# Patient Record
Sex: Female | Born: 1945 | Race: Black or African American | Hispanic: No | Smoking: Current some day smoker
Health system: Southern US, Community
[De-identification: ages and names within clinical notes are randomized; demographics above are authoritative.]

## PROBLEM LIST (undated history)

## (undated) DIAGNOSIS — K759 Inflammatory liver disease, unspecified: Secondary | ICD-10-CM

## (undated) DIAGNOSIS — E119 Type 2 diabetes mellitus without complications: Secondary | ICD-10-CM

## (undated) DIAGNOSIS — K219 Gastro-esophageal reflux disease without esophagitis: Secondary | ICD-10-CM

## (undated) DIAGNOSIS — Z8669 Personal history of other diseases of the nervous system and sense organs: Secondary | ICD-10-CM

## (undated) DIAGNOSIS — Z8619 Personal history of other infectious and parasitic diseases: Secondary | ICD-10-CM

## (undated) DIAGNOSIS — K766 Portal hypertension: Secondary | ICD-10-CM

## (undated) DIAGNOSIS — K7460 Unspecified cirrhosis of liver: Secondary | ICD-10-CM

## (undated) DIAGNOSIS — E1169 Type 2 diabetes mellitus with other specified complication: Secondary | ICD-10-CM

## (undated) DIAGNOSIS — I8500 Esophageal varices without bleeding: Secondary | ICD-10-CM

## (undated) DIAGNOSIS — E785 Hyperlipidemia, unspecified: Secondary | ICD-10-CM

## (undated) DIAGNOSIS — K3189 Other diseases of stomach and duodenum: Secondary | ICD-10-CM

## (undated) DIAGNOSIS — I10 Essential (primary) hypertension: Secondary | ICD-10-CM

## (undated) HISTORY — DX: Type 2 diabetes mellitus without complications: E11.9

## (undated) HISTORY — DX: Esophageal varices without bleeding: I85.00

---

## 1992-02-20 HISTORY — PX: ABDOMINAL HYSTERECTOMY: SHX81

## 1993-02-19 HISTORY — PX: BREAST BIOPSY: SHX20

## 2004-02-20 DIAGNOSIS — E119 Type 2 diabetes mellitus without complications: Secondary | ICD-10-CM

## 2004-02-20 HISTORY — DX: Type 2 diabetes mellitus without complications: E11.9

## 2008-02-20 HISTORY — PX: COLONOSCOPY: SHX174

## 2008-10-05 ENCOUNTER — Ambulatory Visit: Payer: Self-pay | Admitting: Internal Medicine

## 2008-10-27 ENCOUNTER — Ambulatory Visit: Payer: Self-pay | Admitting: Internal Medicine

## 2009-01-21 ENCOUNTER — Ambulatory Visit: Payer: Self-pay | Admitting: Gastroenterology

## 2009-10-19 ENCOUNTER — Ambulatory Visit: Payer: Self-pay | Admitting: Internal Medicine

## 2010-08-15 ENCOUNTER — Ambulatory Visit: Payer: Self-pay | Admitting: Otolaryngology

## 2010-11-21 ENCOUNTER — Ambulatory Visit: Payer: Self-pay | Admitting: Internal Medicine

## 2011-11-22 ENCOUNTER — Ambulatory Visit: Payer: Self-pay | Admitting: Internal Medicine

## 2012-02-20 HISTORY — PX: UPPER GI ENDOSCOPY: SHX6162

## 2012-04-19 DIAGNOSIS — I8500 Esophageal varices without bleeding: Secondary | ICD-10-CM

## 2012-04-19 HISTORY — DX: Esophageal varices without bleeding: I85.00

## 2012-04-27 ENCOUNTER — Inpatient Hospital Stay: Payer: Self-pay | Admitting: Internal Medicine

## 2012-04-27 LAB — CBC
HGB: 12.2 g/dL (ref 12.0–16.0)
MCHC: 33.1 g/dL (ref 32.0–36.0)
MCV: 101 fL — ABNORMAL HIGH (ref 80–100)
Platelet: 142 10*3/uL — ABNORMAL LOW (ref 150–440)
RBC: 3.66 10*6/uL — ABNORMAL LOW (ref 3.80–5.20)
RDW: 14.1 % (ref 11.5–14.5)
WBC: 11.9 10*3/uL — ABNORMAL HIGH (ref 3.6–11.0)

## 2012-04-27 LAB — COMPREHENSIVE METABOLIC PANEL
Albumin: 2.8 g/dL — ABNORMAL LOW (ref 3.4–5.0)
Alkaline Phosphatase: 114 U/L (ref 50–136)
Anion Gap: 11 (ref 7–16)
BUN: 26 mg/dL — ABNORMAL HIGH (ref 7–18)
Bilirubin,Total: 1 mg/dL (ref 0.2–1.0)
Calcium, Total: 8.5 mg/dL (ref 8.5–10.1)
Chloride: 108 mmol/L — ABNORMAL HIGH (ref 98–107)
Co2: 21 mmol/L (ref 21–32)
Creatinine: 0.96 mg/dL (ref 0.60–1.30)
EGFR (Non-African Amer.): >60
Glucose: 161 mg/dL — ABNORMAL HIGH (ref 65–99)
Osmolality: 288 (ref 275–301)
Potassium: 3.5 mmol/L (ref 3.5–5.1)
SGOT(AST): 46 U/L — ABNORMAL HIGH (ref 15–37)

## 2012-04-27 LAB — HEMOGLOBIN
HGB: 13.3 g/dL (ref 12.0–16.0)
HGB: 11.9 g/dL — ABNORMAL LOW (ref 12.0–16.0)

## 2012-04-27 LAB — TROPONIN I: Troponin-I: <0.02 ng/mL

## 2012-04-28 LAB — CBC WITH DIFFERENTIAL/PLATELET
Basophil #: 0.1 x10 3/mm 3
Basophil %: 0.6 %
Eosinophil #: 0.2 x10 3/mm 3
Eosinophil #: 0.2 10*3/uL (ref 0.0–0.7)
Eosinophil %: 1.5 %
HCT: 32.5 % — ABNORMAL LOW
HGB: 11.3 g/dL — ABNORMAL LOW
HGB: 10.8 g/dL — ABNORMAL LOW (ref 12.0–16.0)
Lymphocyte #: 4.1 10*3/uL — ABNORMAL HIGH (ref 1.0–3.6)
Lymphocyte %: 44.8 %
Lymphocyte %: 38.9 %
Lymphs Abs: 4.7 x10 3/mm 3 — ABNORMAL HIGH
MCH: 33.8 pg
MCH: 32.5 pg (ref 26.0–34.0)
MCHC: 34.9 g/dL
MCHC: 32.9 g/dL (ref 32.0–36.0)
MCV: 97 fL
MCV: 99 fL (ref 80–100)
Monocyte #: 0.7 "x10 3/mm "
Monocyte %: 7 %
Neutrophil #: 4.8 x10 3/mm 3
Neutrophil #: 5.3 10*3/uL (ref 1.4–6.5)
Neutrophil %: 46.1 %
Neutrophil %: 50.5 %
Platelet: 104 x10 3/mm 3 — ABNORMAL LOW
Platelet: 105 10*3/uL — ABNORMAL LOW (ref 150–440)
RBC: 3.35 X10 6/mm 3 — ABNORMAL LOW
RBC: 3.32 10*6/uL — ABNORMAL LOW (ref 3.80–5.20)
RDW: 15.3 % — ABNORMAL HIGH
RDW: 15.3 % — ABNORMAL HIGH (ref 11.5–14.5)
WBC: 10.4 x10 3/mm 3
WBC: 10.5 10*3/uL (ref 3.6–11.0)

## 2012-04-28 LAB — BASIC METABOLIC PANEL
Anion Gap: 6 — ABNORMAL LOW (ref 7–16)
Chloride: 116 mmol/L — ABNORMAL HIGH (ref 98–107)
EGFR (African American): >60
EGFR (Non-African Amer.): >60
Glucose: 112 mg/dL — ABNORMAL HIGH (ref 65–99)
Potassium: 3.5 mmol/L (ref 3.5–5.1)

## 2012-04-28 LAB — HEMOGLOBIN
HGB: 11.1 g/dL — ABNORMAL LOW (ref 12.0–16.0)
HGB: 11.2 g/dL — ABNORMAL LOW (ref 12.0–16.0)

## 2012-04-28 LAB — MAGNESIUM: Magnesium: 1.3 mg/dL — ABNORMAL LOW

## 2012-04-29 LAB — APTT: Activated PTT: 35.3 secs (ref 23.6–35.9)

## 2012-04-29 LAB — CBC WITH DIFFERENTIAL/PLATELET
Eosinophil %: 2.7 %
HGB: 10.8 g/dL — ABNORMAL LOW (ref 12.0–16.0)
Lymphocyte #: 3.7 10*3/uL — ABNORMAL HIGH (ref 1.0–3.6)
MCH: 33.5 pg (ref 26.0–34.0)
MCV: 98 fL (ref 80–100)
Monocyte #: 0.6 x10 3/mm (ref 0.2–0.9)
Monocyte %: 7 %
Neutrophil %: 44.6 %
RBC: 3.22 10*6/uL — ABNORMAL LOW (ref 3.80–5.20)
WBC: 8.3 10*3/uL (ref 3.6–11.0)

## 2012-04-29 LAB — HEMOGLOBIN: HGB: 11.4 g/dL — ABNORMAL LOW (ref 12.0–16.0)

## 2012-04-29 LAB — IRON AND TIBC
Iron Bind.Cap.(Total): 226 ug/dL — ABNORMAL LOW (ref 250–450)
Unbound Iron-Bind.Cap.: 154 ug/dL

## 2012-04-29 LAB — PROTIME-INR: INR: 1.3

## 2012-04-29 LAB — PATHOLOGY REPORT

## 2012-05-14 ENCOUNTER — Encounter: Payer: Self-pay | Admitting: General Surgery

## 2012-05-14 ENCOUNTER — Ambulatory Visit (INDEPENDENT_AMBULATORY_CARE_PROVIDER_SITE_OTHER): Payer: Medicare Other | Admitting: General Surgery

## 2012-05-14 VITALS — BP 154/88 | HR 80 | Resp 14 | Ht 66.0 in | Wt 166.0 lb

## 2012-05-14 DIAGNOSIS — K259 Gastric ulcer, unspecified as acute or chronic, without hemorrhage or perforation: Secondary | ICD-10-CM

## 2012-05-14 DIAGNOSIS — K8020 Calculus of gallbladder without cholecystitis without obstruction: Secondary | ICD-10-CM

## 2012-05-14 DIAGNOSIS — E119 Type 2 diabetes mellitus without complications: Secondary | ICD-10-CM

## 2012-05-14 NOTE — Progress Notes (Addendum)
Patient ID: Gloria Cole, female   DOB: 1945-12-04, 67 y.o.   MRN: [id number]  No chief complaint on file.   HPI Gloria Cole is a 67 y.o. female.  HPI Patient here today for evaluation of her gallbladder referred by Dr Gloria Cole.  About 2 weeks ago she was hospitalized for 4 days with esophageal varices and the U/S showed gallstones.  Patient denies any abdominal pain other that left side pain from recent fall.  States she received 2 units of blood while at [facility].  Denies family history of colon cancer. Dr Gloria Cole completed a Endoscopy while in [facility].  Past Medical History  Diagnosis Date  . Esophageal varices March 2014  . Diabetes 2006    Past Surgical History  Procedure Laterality Date  . Abdominal hysterectomy  1994  . Colonoscopy  2010  . Upper gi endoscopy  2014    No family history on file.  Social History History  Substance Use Topics  . Smoking status: Current Some Day Smoker -- 1.00 packs/day    Types: Cigarettes  . Smokeless tobacco: Not on file     Comment: 1 pack in 2 weeks  . Alcohol Use: Yes     Comment: occasionally    Allergies  Allergen Reactions  . Penicillins Swelling    Current Outpatient Prescriptions  Medication Sig Dispense Refill  . cholecalciferol (VITAMIN D) 400 UNITS TABS Take 400 Units by mouth daily.      Marland Kitchen estradiol (ESTRACE) 1 MG tablet Take 1 tablet by mouth.      . furosemide (LASIX) 20 MG tablet Take 20 mg by mouth daily as needed.      Marland Kitchen glimepiride (AMARYL) 2 MG tablet Take 2 mg by mouth daily before breakfast.      . metFORMIN (GLUCOPHAGE-XR) 500 MG 24 hr tablet Take 1,500 mg by mouth daily with breakfast.      . Multiple Vitamins-Minerals (HM COMPLETE 50+) TABS Take 1 tablet by mouth daily.      . pantoprazole (PROTONIX) 40 MG tablet Take 40 mg by mouth 2 (two) times daily.      . potassium citrate (UROCIT-K) 10 MEQ (1080 MG) SR tablet Take 10 mEq by mouth daily as needed.      . vitamin B-12 (CYANOCOBALAMIN) 500 MCG tablet Take 500  mcg by mouth daily.       No current facility-administered medications for this visit.    Review of Systems Review of Systems  Constitutional: Positive for fatigue.  Respiratory: Positive for cough and shortness of breath.   Cardiovascular: Negative.   Gastrointestinal: Negative.   Genitourinary: Negative.   Musculoskeletal: Negative.   Neurological: Positive for headaches.    Blood pressure 154/88, pulse 80, resp. rate 14, height 5\' 6"  (1.676 m), weight 166 lb (75.297 kg).  Physical Exam Physical Exam  Constitutional: She is oriented to person, place, and time. She appears well-developed and well-nourished.  Cardiovascular: Normal rate, regular rhythm and intact distal pulses.   Pulmonary/Chest: Effort normal and breath sounds normal.  Abdominal: Soft. Bowel sounds are normal. She exhibits no distension and no ascites. There is no hepatosplenomegaly or hepatomegaly. There is tenderness in the right upper quadrant and epigastric area. No hernia.    Lymphadenopathy:    She has no cervical adenopathy.       Right: No inguinal adenopathy present.       Left: No inguinal adenopathy present.  Neurological: She is alert and oriented to person, place, and time.  Skin: Skin  is warm and dry.  Psychiatric: She has a normal mood and affect. Her speech is normal and behavior is normal. Judgment and thought content normal. Cognition and memory are normal.    Data Reviewed Upper endoscopy gastric ulcer biopsy dated 04/28/2012 showed oxynytic mucosa with mild to moderate chronic active gastritis and ulceration. No H. pylori. No dysplasia or malignancy  Hepatitis profile was negative for hepatitis B./C., hepatitis A negative.  Upper endoscopy dated 04/28/2012 showed grade 2 esophageal varices. Gastric ulcer. Normal duodenum appeared no evidence of recent variceal bleed.  Colonoscopy dated 01/21/2009 was notable for diverticulosis of the sigmoid colon and a single 2 mm descending colon  polyp.  Review of her hospital admission data from March 2014 showed that she remained hemodynamically stable and was transfused. Ultrasound showed evidence of gallstones as well as a questionable dilated duct. HIDA scan was negative for ductal obstruction. Hepatocellular function was determined to be normal based on HIDA scan.  Assessment    1) recent upper GI bleed secondary to gastric ulcer.; 2) esophageal varices with normal portal flow on ultrasound; 3) gallstones in a patient with an 8-10 year history of diabetes.    Plan    The patient should keep her scheduled appointment with the GI department on 05/20/2012. She will likely benefit from elective cholecystectomy. Timing of the procedure would be based on the need for repeat endoscopy to assess the gastric ulcer(likely not necessary based on the description of linear ulceration).  Follow up will be based on the results of her upcoming GI post hospitalization followup.       Gloria Cole 05/17/2012, 8:47 AM    The patient reported feeling weak. Postural blood pressures and pulse analysis were obtained. Supine position the patient's blood pressure was 154/88 with a pulse of 80. In the seated position the blood pressure was 144/84 with a pulse of 82 and in the standing position the blood pressure was 148/80 with a pulse of 78. The modest fall from supine to seated quickly rebounded when the patient stands up. No change in pulse. The patient is not making use of a beta blocker making the absence of  tachycardia with change in position would speak against hypovolemia. The patient has not noted any dark stools since the days immediately after discharge from the hospital

## 2012-05-14 NOTE — Patient Instructions (Addendum)
Laparoscopic Cholecystectomy with Intraoperative Cholangiogram. The procedure, including it's potential risks and complications (including but not limited to infection, bleeding, injury to intra-abdominal organs or bile ducts, bile leak, poor cosmetic result, sepsis and death) were discussed with the patient in detail. Non-operative options, including their inherent risks (acute calculous cholecystitis with possible choledocholithiasis or gallstone pancreatitis, with the risk of ascending cholangitis, sepsis, and death) were discussed as well. The patient expressed and understanding of what we discussed and wants to proceed with a laparoscopic cholecystectomy. The patient further understands that if it is technically not possible, or it is unsafe to proceed laparoscopically, that I will convert to an open cholecystectomy. Educational Booklet given.  Patient to call back if she wishes to proceed. This patient has paperwork for pre-admission if she desires to arrange surgery.

## 2012-05-17 ENCOUNTER — Encounter: Payer: Self-pay | Admitting: General Surgery

## 2012-05-20 HISTORY — PX: CHOLECYSTECTOMY: SHX55

## 2012-05-20 HISTORY — PX: ERCP: SHX5425

## 2012-06-05 ENCOUNTER — Telehealth: Payer: Self-pay | Admitting: *Deleted

## 2012-06-05 NOTE — Telephone Encounter (Signed)
I have left patient a message on cell phone to call the office regarding surgery scheduling.

## 2012-06-05 NOTE — Telephone Encounter (Signed)
Patient called back to say that she would like to come in and see Dr. Lemar Livings before we arrange gallbladder surgery. Appointment for pre-op visit has been scheduled for 06-09-12. I also spoke with Vernona Rieger at [facility] GI department and they will be faxing records from patient's office visit on 05-20-12.

## 2012-06-09 ENCOUNTER — Other Ambulatory Visit: Payer: Self-pay | Admitting: General Surgery

## 2012-06-09 ENCOUNTER — Ambulatory Visit (INDEPENDENT_AMBULATORY_CARE_PROVIDER_SITE_OTHER): Payer: Medicare Other | Admitting: General Surgery

## 2012-06-09 ENCOUNTER — Encounter: Payer: Self-pay | Admitting: General Surgery

## 2012-06-09 ENCOUNTER — Encounter: Payer: Self-pay | Admitting: *Deleted

## 2012-06-09 VITALS — BP 130/82 | HR 80 | Resp 16 | Ht 66.0 in | Wt 159.0 lb

## 2012-06-09 DIAGNOSIS — K7460 Unspecified cirrhosis of liver: Secondary | ICD-10-CM

## 2012-06-09 DIAGNOSIS — K8020 Calculus of gallbladder without cholecystitis without obstruction: Secondary | ICD-10-CM

## 2012-06-09 NOTE — Progress Notes (Signed)
Patient ID: Gloria Cole, female   DOB: May 18, 1945, 67 y.o.   MRN: [id number]  Chief Complaint  Patient presents with  . Pre-op Exam    gallbladder    HPI Gloria Cole is a 67 y.o. female here today for her pre op gallbladder surgery.Patient states her abdominal pain has been going on for three week pain is getting worst. Patient is having nausea and vomiting at least four times a week.    The patient was seen on 05/14/2012 regarding cholelithiasis identified during an admission for upper GI bleeding. Ultrasound at that time that showed findings suggestive of acute cholecystitis although a negative sonographic Murphy sign was recorded. The patient reports since her last visit here she is developed a pain in the right upper quadrant and postprandial vomiting, usually occurring in the evening. She may vomit all the cheesy in that day.  She was evaluated by the GI service on 05/20/2012 at that time she is not having any abdominal pain. And MRI of the liver and spleen was to be completed, but it has not been completed at this time.  Laboratory studies of that date showed an INR of 1.3, minimal prolongation of the ProTime of 13.4 (upper limit of normal 12.2). Alkaline phosphatase 116 (upper limit of normal 104) AST 41 (upper limit of normal 39) otherwise normal liver function studies. Normal ceruloplasmin, alpha-fetoprotein. Smooth muscle actin antibody was elevated at 31 (0-19). Mitochondrial antibody was normal. Ammonia was normal. Alpha-1 antitrypsin was normal. ANA was negative. Previous biopsies of the stomach completed 04/28/2012 were negative.  MRI of the abdomen dated 05/30/2012 to a small nodular liver. No abnormal areas of enhancement or washout. Normal gallbladder. No intra-or extrahepatic biliary duct abnormality reported. No free fluid or adenopathy. Impression hepatic cirrhosis with small caliber. Esophageal gastrohepatic and splenic varices appear no evidence of hepatocellular  carcinoma. Previous abdominal ultrasound showed modest dilatation of the common bile duct of 8.8 mm, cholelithiasis and sludge within the gallbladder. HPI  Past Medical History  Diagnosis Date  . Esophageal varices March 2014  . Diabetes 2006    Past Surgical History  Procedure Laterality Date  . Abdominal hysterectomy  1994  . Colonoscopy  2010  . Upper gi endoscopy  2014    No family history on file.  Social History History  Substance Use Topics  . Smoking status: Current Some Day Smoker -- 1.00 packs/day for 30 years    Types: Cigarettes  . Smokeless tobacco: Not on file     Comment: 1 pack in 2 weeks  . Alcohol Use: Yes     Comment: occasionally    Allergies  Allergen Reactions  . Penicillins Swelling    Current Outpatient Prescriptions  Medication Sig Dispense Refill  . cholecalciferol (VITAMIN D) 400 UNITS TABS Take 400 Units by mouth daily.      Marland Kitchen estradiol (ESTRACE) 1 MG tablet Take 1 tablet by mouth.      . furosemide (LASIX) 20 MG tablet Take 20 mg by mouth daily as needed.      Marland Kitchen glimepiride (AMARYL) 2 MG tablet Take 2 mg by mouth daily before breakfast.      . metFORMIN (GLUCOPHAGE-XR) 500 MG 24 hr tablet Take 1,500 mg by mouth daily with breakfast.      . Multiple Vitamins-Minerals (HM COMPLETE 50+) TABS Take 1 tablet by mouth daily.      . nadolol (CORGARD) 20 MG tablet       . pantoprazole (PROTONIX) 40 MG tablet Take  40 mg by mouth 2 (two) times daily.      . potassium citrate (UROCIT-K) 10 MEQ (1080 MG) SR tablet Take 10 mEq by mouth daily as needed.      . vitamin B-12 (CYANOCOBALAMIN) 500 MCG tablet Take 500 mcg by mouth daily.       No current facility-administered medications for this visit.    Review of Systems Review of Systems  Constitutional: Positive for chills.  Respiratory: Negative.   Cardiovascular: Negative.   Gastrointestinal: Positive for vomiting, abdominal pain and constipation.    Blood pressure 130/82, pulse 80, resp. rate  16, height 5\' 6"  (1.676 m), weight 159 lb (72.122 kg). The patient's weight is down 7 pounds from that recorded at the time of her 05/20/2012 exam.  Physical Exam Physical Exam  Constitutional: She appears well-developed and well-nourished.  Neck: Neck supple.  Cardiovascular: Normal rate, regular rhythm and normal heart sounds.   Pulmonary/Chest: Effort normal and breath sounds normal.  Abdominal: Soft. Bowel sounds are normal. There is tenderness in the right upper quadrant and epigastric area.   No lower extremity edema is identified.  Data Reviewed See above  Assessment    Chronic cholecystitis and cholelithiasis; evidence of hepatic cirrhosis with small varices identified on endoscopy and MR imaging.    Plan    The patient showed mild tenderness throughout the abdomen on today's exam, especially in the right upper quadrant. In light of this and the ultrasound of March 2014 elective cholecystectomy has been recommended. Liver biopsy at the same time had been requested by the GI service.  The patient was accompanied today by her younger brother. I reviewed with both the patient and her brother the risks associated with elective surgery in those with known liver impairment. The risks of bleeding with liver biopsy were discussed, but as she is not thought to be 1 to consume alcohol, liver biopsy may help tailor future treatment.      Patient's surgery has been scheduled for 06-12-12 at [facility].   [name] 06/09/2012, 8:41 PM  CC: Gloria Cole, M.D.; [facility]

## 2012-06-09 NOTE — Patient Instructions (Addendum)
Laparoscopic Cholecystectomy with Intraoperative Cholangiogram. The procedure, including it's potential risks and complications (including but not limited to infection, bleeding, injury to intra-abdominal organs or bile ducts, bile leak, poor cosmetic result, sepsis and death) were discussed with the patient in detail. Non-operative options, including their inherent risks (acute calculous cholecystitis with possible choledocholithiasis or gallstone pancreatitis, with the risk of ascending cholangitis, sepsis, and death) were discussed as well. The patient expressed and understanding of what we discussed and wishes to proceed with laparoscopic cholecystectomy. The patient further understands that if it is technically not possible, or it is unsafe to proceed laparoscopically, that I will convert to an open cholecystectomy.  Patient to have an MRI abdomen with and without contrast at [facility] on 06-11-12 at 11:45 am (arrive 11 am). This patient is aware of date, time, and instructions.   Patient's surgery has been scheduled for 06-12-12 at [facility].

## 2012-06-10 ENCOUNTER — Telehealth: Payer: Self-pay | Admitting: *Deleted

## 2012-06-10 NOTE — Telephone Encounter (Signed)
Patient is aware the MRI will be cancelled. She verbalizes instructions. Clydie Braun in scheduling has been notified.

## 2012-06-10 NOTE — Telephone Encounter (Signed)
Message copied by Nicholes Mango on Tue Jun 10, 2012 12:32 PM ------      Message from: Tool, [place] W      Created: Mon Jun 09, 2012  8:49 PM       Please cancel the MRI requested at the time of the patient's office visit. This was completed at [facility] on 05/30/2012.  ------

## 2012-06-11 ENCOUNTER — Ambulatory Visit: Payer: Self-pay | Admitting: General Surgery

## 2012-06-11 ENCOUNTER — Encounter: Payer: Self-pay | Admitting: General Surgery

## 2012-06-11 LAB — BASIC METABOLIC PANEL
Anion Gap: 4 — ABNORMAL LOW (ref 7–16)
BUN: 12 mg/dL (ref 7–18)
Calcium, Total: 9.3 mg/dL (ref 8.5–10.1)
Co2: 28 mmol/L (ref 21–32)
EGFR (Non-African Amer.): >60
Glucose: 99 mg/dL (ref 65–99)
Potassium: 4 mmol/L (ref 3.5–5.1)
Sodium: 138 mmol/L (ref 136–145)

## 2012-06-11 LAB — CBC WITH DIFFERENTIAL/PLATELET
Basophil %: 0.7 %
Eosinophil #: 0.3 10*3/uL (ref 0.0–0.7)
HCT: 40.7 % (ref 35.0–47.0)
HGB: 13.5 g/dL (ref 12.0–16.0)
Lymphocyte #: 2.5 10*3/uL (ref 1.0–3.6)
Lymphocyte %: 34.8 %
MCHC: 33.1 g/dL (ref 32.0–36.0)
MCV: 98 fL (ref 80–100)
Monocyte %: 11.9 %
Neutrophil #: 3.6 10*3/uL (ref 1.4–6.5)
Neutrophil %: 49.1 %
Platelet: 144 10*3/uL — ABNORMAL LOW (ref 150–440)
RBC: 4.15 10*6/uL (ref 3.80–5.20)
RDW: 14.7 % — ABNORMAL HIGH (ref 11.5–14.5)
WBC: 7.3 10*3/uL (ref 3.6–11.0)

## 2012-06-11 LAB — PROTIME-INR: Prothrombin Time: 15.5 secs — ABNORMAL HIGH (ref 11.5–14.7)

## 2012-06-12 ENCOUNTER — Ambulatory Visit: Payer: Self-pay | Admitting: General Surgery

## 2012-06-12 DIAGNOSIS — K8070 Calculus of gallbladder and bile duct without cholecystitis without obstruction: Secondary | ICD-10-CM

## 2012-06-12 LAB — PROTIME-INR: Prothrombin Time: 17 secs — ABNORMAL HIGH (ref 11.5–14.7)

## 2012-06-12 LAB — HEPATIC FUNCTION PANEL A (ARMC)
Albumin: 2.8 g/dL — ABNORMAL LOW (ref 3.4–5.0)
Alkaline Phosphatase: 103 U/L (ref 50–136)
Bilirubin,Total: 0.7 mg/dL (ref 0.2–1.0)
SGOT(AST): 61 U/L — ABNORMAL HIGH (ref 15–37)

## 2012-06-13 LAB — COMPREHENSIVE METABOLIC PANEL
Albumin: 2.6 g/dL — ABNORMAL LOW (ref 3.4–5.0)
Alkaline Phosphatase: 107 U/L (ref 50–136)
Bilirubin,Total: 1.5 mg/dL — ABNORMAL HIGH (ref 0.2–1.0)
Calcium, Total: 8.4 mg/dL — ABNORMAL LOW (ref 8.5–10.1)
Chloride: 109 mmol/L — ABNORMAL HIGH (ref 98–107)
EGFR (African American): >60
EGFR (Non-African Amer.): >60
Glucose: 94 mg/dL (ref 65–99)
Sodium: 141 mmol/L (ref 136–145)
Total Protein: 7.1 g/dL (ref 6.4–8.2)

## 2012-06-13 LAB — PROTIME-INR: Prothrombin Time: 16.5 secs — ABNORMAL HIGH (ref 11.5–14.7)

## 2012-06-17 LAB — PATHOLOGY REPORT

## 2012-06-18 ENCOUNTER — Encounter: Payer: Self-pay | Admitting: General Surgery

## 2012-06-19 ENCOUNTER — Other Ambulatory Visit: Payer: Self-pay | Admitting: *Deleted

## 2012-06-19 ENCOUNTER — Ambulatory Visit (INDEPENDENT_AMBULATORY_CARE_PROVIDER_SITE_OTHER): Payer: Medicare Other | Admitting: *Deleted

## 2012-06-19 ENCOUNTER — Encounter: Payer: Self-pay | Admitting: *Deleted

## 2012-06-19 DIAGNOSIS — K8020 Calculus of gallbladder without cholecystitis without obstruction: Secondary | ICD-10-CM

## 2012-06-19 NOTE — Patient Instructions (Addendum)
The patient is aware to call back for any questions or concerns. Aware to use heating pad for comfort. Discussed using Miralax and/or stool softeners to avoid constipation.

## 2012-06-19 NOTE — Progress Notes (Signed)
Patient came in today for a wound check post cholecystectomy and ERCP.  The port sites are clean, with no signs of infection noted. Steri strips intact at several sites, minimal bruising. States she is still using her pain medication.  She did vomit once on Wednesday after eating chicken salad sandwich. F/u with MD made.

## 2012-07-02 ENCOUNTER — Encounter: Payer: Self-pay | Admitting: General Surgery

## 2012-07-03 ENCOUNTER — Encounter: Payer: Self-pay | Admitting: General Surgery

## 2012-07-03 ENCOUNTER — Ambulatory Visit (INDEPENDENT_AMBULATORY_CARE_PROVIDER_SITE_OTHER): Payer: Medicare Other | Admitting: General Surgery

## 2012-07-03 VITALS — BP 100/60 | HR 78 | Resp 14 | Ht 66.0 in | Wt 156.0 lb

## 2012-07-03 DIAGNOSIS — K7460 Unspecified cirrhosis of liver: Secondary | ICD-10-CM

## 2012-07-03 DIAGNOSIS — K8020 Calculus of gallbladder without cholecystitis without obstruction: Secondary | ICD-10-CM

## 2012-07-03 DIAGNOSIS — R112 Nausea with vomiting, unspecified: Secondary | ICD-10-CM

## 2012-07-03 MED ORDER — ONDANSETRON 8 MG PO TBDP: 8.0000 mg | ORAL_TABLET | Freq: Three times a day (TID) | ORAL | Status: DC | PRN

## 2012-07-03 NOTE — Progress Notes (Signed)
Patient ID: Gloria Cole, female   DOB: 1945/03/23, 67 y.o.   MRN: [id number]  Chief Complaint  Patient presents with  . Routine Post Op    HPI Gloria Cole is a 67 y.o. female. Patient here today fro postop visit laparoscopy cholecystectomy on 06-12-12 and had an ERCP for stone removal by Dr Gloria Cole.  Still having some pain and nausea. She does still vomit occasionally.  Saw Gloria Cole at [facility] and has been referred to [facility] for liver follow up.   HPI  Past Medical History  Diagnosis Date  . Esophageal varices March 2014  . Diabetes 2006    Past Surgical History  Procedure Laterality Date  . Abdominal hysterectomy  1994  . Colonoscopy  2010  . Upper gi endoscopy  2014  . Ercp  April 2014    Dr Gloria Cole  . Cholecystectomy  April 2014    Dr Gloria Cole    No family history on file.  Social History History  Substance Use Topics  . Smoking status: Current Some Day Smoker -- 1.00 packs/day for 30 years    Types: Cigarettes  . Smokeless tobacco: Not on file     Comment: 1 pack in 2 weeks  . Alcohol Use: Yes     Comment: occasionally    Allergies  Allergen Reactions  . Penicillins Swelling    Current Outpatient Prescriptions  Medication Sig Dispense Refill  . cholecalciferol (VITAMIN D) 400 UNITS TABS Take 400 Units by mouth daily.      Marland Kitchen estradiol (ESTRACE) 1 MG tablet Take 1 tablet by mouth.      Marland Kitchen glimepiride (AMARYL) 2 MG tablet Take 2 mg by mouth daily before breakfast.      . metFORMIN (GLUCOPHAGE-XR) 500 MG 24 hr tablet Take 1,500 mg by mouth daily with breakfast.      . Multiple Vitamins-Minerals (HM COMPLETE 50+) TABS Take 1 tablet by mouth daily.      . nadolol (CORGARD) 20 MG tablet       . ondansetron (ZOFRAN ODT) 8 MG disintegrating tablet Take 1 tablet (8 mg total) by mouth every 8 (eight) hours as needed for nausea.  50 tablet  3  . pantoprazole (PROTONIX) 40 MG tablet Take 40 mg by mouth 2 (two) times daily.      . potassium citrate (UROCIT-K)  10 MEQ (1080 MG) SR tablet Take 10 mEq by mouth daily as needed.      . pravastatin (PRAVACHOL) 40 MG tablet       . vitamin B-12 (CYANOCOBALAMIN) 500 MCG tablet Take 500 mcg by mouth daily.       No current facility-administered medications for this visit.    Review of Systems Review of Systems  Constitutional: Negative.   Respiratory: Negative.   Cardiovascular: Negative.     Blood pressure 100/60, pulse 78, resp. rate 14, height 5\' 6"  (1.676 m), weight 156 lb (70.761 kg).  Physical Exam Physical Exam  Constitutional: She is oriented to person, place, and time. She appears well-developed and well-nourished.  Cardiovascular: Normal rate and regular rhythm.   Pulmonary/Chest: Effort normal and breath sounds normal.  Abdominal: Soft.  Neurological: She is alert and oriented to person, place, and time.  Skin: Skin is warm and dry.  Port sites are healing well. No evidence of icterus. No abdominal tenderness.  Data Reviewed Gallbladder pathology showed chronic cholecystitis and cholelithiasis.  Liver biopsy showed evidence of advanced cirrhosis.  No evidence of gastric outlet obstruction at the  time of ERCP or previous EGD.  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  Persistent nausea and vomiting postcholecystectomy.    Plan    The case was reviewed in detail with Gloria Shark, NP from the GI department. The patient has not made her aware of her nausea and vomiting at the time of yesterday's evaluation. With her varices this needs to be minimize to decrease the risk of recurrent variceal bleeding.  The patient will be started on Zofran 8 mg dissolving tablets 3 times a day. Gloria Cole will contact the patient to arrange followup the next 1-2 weeks to assess her progress and to confirm followup with the [facility] hepatology Department.  In light of the patient's 9 pound weight loss since preop, and the modest blood pressure today, she was instructed to discontinue her  Lasix until her next GI appointment.       Gloria Cole 07/03/2012, 8:46 PM

## 2012-07-03 NOTE — Patient Instructions (Addendum)
The patient is aware to call back for any questions or concerns.  Follow up with Owens Shark at [facility] Follow up with [facility] as scheduled Zofran as directed  Stop Lasix for now Eliminate alcohol intake as discussed

## 2012-09-11 ENCOUNTER — Ambulatory Visit: Payer: Self-pay | Admitting: Gastroenterology

## 2012-09-14 LAB — PATHOLOGY REPORT

## 2012-11-25 ENCOUNTER — Ambulatory Visit: Payer: Self-pay | Admitting: Internal Medicine

## 2012-12-25 ENCOUNTER — Ambulatory Visit: Payer: Self-pay | Admitting: Gastroenterology

## 2012-12-25 LAB — CBC WITH DIFFERENTIAL/PLATELET
Eosinophil #: 0.2 10*3/uL (ref 0.0–0.7)
HCT: 37.3 % (ref 35.0–47.0)
Lymphocyte #: 2.2 10*3/uL (ref 1.0–3.6)
MCHC: 34.6 g/dL (ref 32.0–36.0)
MCV: 98 fL (ref 80–100)
Monocyte #: 0.7 x10 3/mm (ref 0.2–0.9)
Monocyte %: 13 %
Platelet: 139 10*3/uL — ABNORMAL LOW (ref 150–440)
RBC: 3.8 10*6/uL (ref 3.80–5.20)
WBC: 5.7 10*3/uL (ref 3.6–11.0)

## 2012-12-25 LAB — PROTIME-INR
INR: 1.3
Prothrombin Time: 15.8 secs — ABNORMAL HIGH (ref 11.5–14.7)

## 2012-12-30 ENCOUNTER — Emergency Department: Payer: Self-pay | Admitting: Emergency Medicine

## 2013-01-13 ENCOUNTER — Emergency Department: Payer: Self-pay | Admitting: Emergency Medicine

## 2013-01-13 LAB — URINALYSIS, COMPLETE
Leukocyte Esterase: NEGATIVE
WBC UR: 9 /HPF (ref 0–5)

## 2013-01-13 LAB — COMPREHENSIVE METABOLIC PANEL
Albumin: 3.5 g/dL (ref 3.4–5.0)
Bilirubin,Total: 0.6 mg/dL (ref 0.2–1.0)
Calcium, Total: 9.6 mg/dL (ref 8.5–10.1)
EGFR (Non-African Amer.): 36 — ABNORMAL LOW
Glucose: 95 mg/dL (ref 65–99)
Sodium: 137 mmol/L (ref 136–145)
Total Protein: 8.7 g/dL — ABNORMAL HIGH (ref 6.4–8.2)

## 2013-01-13 LAB — CBC WITH DIFFERENTIAL/PLATELET
Basophil #: 0.1 10*3/uL (ref 0.0–0.1)
Eosinophil %: 1.9 %
HCT: 42.6 % (ref 35.0–47.0)
Lymphocyte #: 1.9 10*3/uL (ref 1.0–3.6)
Lymphocyte %: 17 %
MCH: 33.2 pg (ref 26.0–34.0)
MCV: 98 fL (ref 80–100)
Neutrophil %: 67.7 %
WBC: 10.9 10*3/uL (ref 3.6–11.0)

## 2013-01-16 LAB — URINE CULTURE

## 2013-01-19 ENCOUNTER — Ambulatory Visit: Payer: Self-pay | Admitting: Internal Medicine

## 2013-07-14 ENCOUNTER — Ambulatory Visit: Payer: Self-pay | Admitting: Gastroenterology

## 2013-12-21 ENCOUNTER — Encounter: Payer: Self-pay | Admitting: General Surgery

## 2013-12-24 ENCOUNTER — Ambulatory Visit: Payer: Self-pay | Admitting: Gastroenterology

## 2013-12-24 LAB — AMMONIA: Ammonia, Plasma: 68 mcmol/L — ABNORMAL HIGH (ref 11–32)

## 2014-01-04 ENCOUNTER — Ambulatory Visit: Payer: Self-pay | Admitting: Internal Medicine

## 2014-06-11 NOTE — Op Note (Signed)
PATIENT NAME:  Gloria Cole, Gloria Cole MR#:  [id number] DATE OF BIRTH:  1945-09-08  DATE OF PROCEDURE:  06/12/2012  PREOPERATIVE DIAGNOSIS:  1.  Chronic cholecystitis and cholelithiasis.  2.  Idiopathic cirrhosis.   POSTOPERATIVE DIAGNOSIS: 1.  Chronic cholecystitis and cholelithiasis. 2.  Idiopathic cirrhosis.   OPERATIVE PROCEDURE: 1.  Laparoscopic cholecystectomy with intraoperative cholangiograms.  2.  Right lobe liver biopsy.   SURGEON: Gloria Ard, MD   ANESTHESIA: General endotracheal.   ANESTHESIOLOGIST:  Dr. Jabier Mutton BLOOD LOSS: Less than 10 mL.   CLINICAL NOTE: This 69 year old woman has been identified with cirrhosis and has had increasing episodes of abdominal pain associated with nausea and vomiting. She had previously undergone ERCP and cytology of the distal common bile duct as well as identification of a gastric ulcer. Due to her increasing symptoms, she was felt to be a candidate for elective cholecystectomy and liver biopsy.   OPERATIVE NOTE: The patient underwent general endotracheal anesthesia without difficulty. She did have knee-high TED stockings and pneumatic compression stockings in place for DVT prophylaxis. She received Kefzol prior to the procedure due to her past history of diabetes.   With the patient in the Trendelenburg position and supine on the operating table, the abdomen was prepped with ChloraPrep and draped. A Veress needle was placed through a transumbilical incision. After assuring intra-abdominal location with the hanging drop test, the abdomen was insufflated with CO2 at 10 mmHg pressure. A 10 mm step port was expanded, and inspection showed no evidence of injury from initial port placement. An 11 mm Xcel port was placed in the epigastrium and two 5 mm step ports placed laterally. A grossly nodular liver was identified. The gallbladder was moderately distended but had a thin wall. This was decompressed. It was not possible to expose the neck  of the gallbladder by elevating the liver due to its density. A fifth port was placed in the right anterior axillary line and a PEER retractor used to support the weight of the liver. The neck of the gallbladder was cleared. The cystic artery overlay the cystic duct.  This was doubly clipped and divided. The cystic duct was cleared and fluoroscopic cholangiograms completed. This showed free flow into the common bile duct and reflux into the right and left hepatic ducts but no flow into the duodenum; 40 mL of one-half strength Conray was used, and there was a suggestion of a distal common bile duct filling defect. The cystic duct was doubly clipped and divided. The gallbladder was then removed from the liver bed making use of hook cautery dissection and delivered through the umbilical port site, and multiple small 1 mm pigmented stones were identified on retrieval. Attention was turned towards biopsy of the right lobe of the liver. An appropriate site just below the costal margin in the mid clavicular line was chosen, and a 14-gauge Bard device was used to take 3 samples from the right lobe of the liver. Bleeding was controlled with direct pressure, electrocautery and a small piece of Surgicel left in place at the end of the procedure. The right upper quadrant was irrigated with lactated Ringer's solution. Good hemostasis was appreciated. The abdomen was then desufflated and ports removed under direct vision.   As there was a suggestion of distal common bile duct obstruction in spite of near normal liver function studies prior to surgery, she will be admitted to observation and consultation obtained from Verdie Shire, MD from GI for a possible ERCP.   ____________________________  Robert Bellow, MD [email or web address] D: 06/12/2012 21:21:45 ET T: 06/12/2012 22:01:55 ET JOB#: [id number]  cc: Robert Bellow, MD, <Dictator> Ocie Cornfield. Ouida Sills, MD [name]. Candace Cruise, MD Temekia Caskey Amedeo Kinsman MD ELECTRONICALLY SIGNED 06/13/2012  9:25

## 2014-06-11 NOTE — Discharge Summary (Signed)
Dates of Admission and Diagnosis:  Date of Admission 27-Apr-2012   Date of Discharge 01-May-2012   Admitting Diagnosis gi bleed   Final Diagnosis anemia from blood loss   Discharge Diagnosis 1 hypovolemic shock   2 gastric ulcers   3 esophageal varices   4 diabetes type II   5 choleylithiasis    Chief Complaint/History of Present Illness see h and p   Hepatic:  09-Mar-14 10:41   Bilirubin, Total 1.0  Alkaline Phosphatase 114  SGPT (ALT) 40  SGOT (AST)  46  Total Protein, Serum 7.6  Albumin, Serum  2.8  Pathology:  10-Mar-14 00:12   Pathology Report ========== TEST NAME ==========  ========= RESULTS =========  = REFERENCE RANGE =  PATHOLOGY REPORT  Pathology Report .                               [   Final Report         ]                   Material submitted:         . STOMACH ULCERS BIOPSY .                               [   Final Report         ]                   Pre-operative diagnosis:                                        . HEMATEMESIS, EGD .                               [   Final Report    ]                   ********************************************************************** Diagnosis: STOMACH ULCERS BIOPSY: - OXYNTIC MUCOSA WITH MILD TO MODERATE CHRONIC ACTIVE GASTRITIS AND ULCERATION. - NEGATIVE FOR HELICOBACTER PYLORI BY DIFF-QUICK STAIN. - NEGATIVE FOR DYSPLASIA AND MALIGNANCY. Marland Kitchen Note: The control slide stained appropriately. XDB/04/29/2012 ********************************************************************** .                               [   Final Report        ]                   Electronically signed:                                     . Lum Babe, MD, Pathologist .                               [   Final Report         ]                   Gross description:        . Received in a formalin filled container labeled Gloria Celeste  Cole are two tan pink soft tissue fragments 0.2 and 0.4 cm in greatest dimensions.  Entirely submitted  in cassette 1. . Part A, Block 1 - 2 pieces QAC/ASB .            [   Final Report         ]                   Pathologist provided ICD-9: 535.50, 531.91 .                               [   Final Report         ]                   CPT                                                        . [place]            No: [id number]           [address] [place], [place] [address]           Lindon Romp, [place]                                         [id number]   Result(s) reported on 29 Apr 2012 at 05:20PM.  Routine BB:  09-Mar-14 10:41   ABO Group + Rh Type O Negative  Antibody Screen NEGATIVE (Result(s) reported on 27 Apr 2012 at 11:29AM.)  Crossmatch Unit 1 Transfused  Crossmatch Unit 2 Transfused  Result(s) reported on 29 Apr 2012 at 07:29AM.  General Ref:  09-Mar-14 10:41   RBC Folate ========== TEST NAME ==========  ========= RESULTS =========  = REFERENCE RANGE =  FOLATES, RBC  Folate, RBC Folate, Hemolysate              [   120.0 ng/mL          ]        Not Estab. Folate, RBC                     [L  329 ng/mL            ]         [phone]               [facility]            No: [id number]           [address], [place], [place] [address]           Lindon Romp, MD         [phone]   Result(s) reported on 30 Apr 2012 at 09:06AM.  11-Mar-14 04:18   Hepatitis Profile VIII ========== TEST NAME ==========  ========= RESULTS =========  = REFERENCE RANGE =  HEPATITIS PROFILE VIII  HBV/HCV (Profile VIII) Hep Be Ag                       [  Negative             ]          Negative               [facility]        No: [id number]           [address], [place], [place] [address]           Lindon Romp, MD         [phone]   Result(s) reported on 30 Apr 2012 at 04:53PM.  Cardiology:  09-Mar-14 09:39   Ventricular Rate 85  Atrial Rate 85   P-R Interval 156  QRS Duration 76  QT 406  QTc 483  P Axis 59  R Axis 19  T Axis 77  ECG interpretation Normal sinus rhythm Prolonged QT Abnormal ECG No previous ECGs available ----------unconfirmed---------- Confirmed by OVERREAD, NOT ([id number]), editor PEARSON, BARBARA ([id number]) on 04/29/2012 12:47:49 PM  Routine Chem:  09-Mar-14 10:41   Glucose, Serum  161  BUN  26  Creatinine (comp) 0.96  Sodium, Serum 140  Potassium, Serum 3.5  Chloride, Serum  108  CO2, Serum 21  Calcium (Total), Serum 8.5  Anion Gap 11  Osmolality (calc) 288  eGFR (African American) >60  eGFR (Non-African American) >60 (eGFR values <9m/min/1.73 m2 may be an indication of chronic kidney disease (CKD). Calculated eGFR is useful in patients with stable renal function. The eGFR calculation will not be reliable in acutely ill patients when serum creatinine is changing rapidly. It is not useful in  patients on dialysis. The eGFR calculation may not be applicable to patients at the low and high extremes of body sizes, pregnant women, and vegetarians.)  10-Mar-14 01:37   Result Comment HGB - DUPLICATE ORDER. CBC ORDERED AS WELL  Result(s) reported on 28 Apr 2012 at 01:48AM.  Glucose, Serum  112  BUN  22  Creatinine (comp) 0.80  Sodium, Serum  146  Potassium, Serum 3.5  Chloride, Serum  116  CO2, Serum 24  Calcium (Total), Serum  7.4  Anion Gap  6  Osmolality (calc) 295  eGFR (African American) >60  eGFR (Non-African American) >60 (eGFR values <637mmin/1.73 m2 may be an indication of chronic kidney disease (CKD). Calculated eGFR is useful in patients with stable renal function. The eGFR calculation will not be reliable in acutely ill patients when serum creatinine is changing rapidly. It is not useful in  patients on dialysis. The eGFR calculation may not be applicable to patients at the low and high extremes of body sizes, pregnant women, and vegetarians.)  Magnesium, Serum  1.3 (1.8-2.4 THERAPEUTIC  RANGE: 4-7 mg/dL TOXIC: > 10 mg/dL  -----------------------)  11-Mar-14 04:18   Iron Binding Capacity (TIBC)  226  Unbound Iron Binding Capacity 154  Iron, Serum 72  Iron Saturation 32 (Result(s) reported on 29 Apr 2012 at 08:54AM.)  Ferritin (ALifescape97 (Result(s) reported on 29 Apr 2012 at 08:57AM.)  Cardiac:  09-Mar-14 10:41   Troponin I < 0.02 (0.00-0.05 0.05 ng/mL or less: NEGATIVE  Repeat testing in 3-6 hrs  if clinically indicated. >0.05 ng/mL: POTENTIAL  MYOCARDIAL INJURY. Repeat  testing in 3-6 hrs if  clinically indicated. NOTE: An increase or decrease  of 30% or more on serial  testing suggests a  clinically important change)  Routine Coag:  11-Mar-14 04:18   Activated PTT (APTT) 35.3 (A HCT value >55% may artifactually increase the APTT. In one study, the increase  was an average of 19%. Reference: "Effect on Routine and Special Coagulation Testing Values of Citrate Anticoagulant Adjustment in Patients with High HCT Values." American Journal of Clinical Pathology 2006;126:400-405.)  Prothrombin  16.0  INR 1.3 (INR reference interval applies to patients on anticoagulant therapy. A single INR therapeutic range for coumarins is not optimal for all indications; however, the suggested range for most indications is 2.0 - 3.0. Exceptions to the INR Reference Range may include: Prosthetic heart valves, acute myocardial infarction, prevention of myocardial infarction, and combinations of aspirin and anticoagulant. The need for a higher or lower target INR must be assessed individually. Reference: The Pharmacology and Management of the Vitamin K  antagonists: the seventh ACCP Conference on Antithrombotic and Thrombolytic Therapy. AGTXM.4680 Sept:126 (3suppl): [id number]. A HCT value >55% may artifactually increase the PT.  In one study,  the increase was an average of 25%. Reference:  "Effect on Routine and Special Coagulation Testing Values of Citrate Anticoagulant  Adjustment in Patients with High HCT Values." American Journal of Clinical Pathology 2006;126:400-405.)  Routine Hem:  09-Mar-14 10:41   Hemoglobin (CBC) 12.2  WBC (CBC)  11.9  RBC (CBC)  3.66  Hematocrit (CBC) 36.8  Platelet Count (CBC)  142 (Result(s) reported on 27 Apr 2012 at 10:58AM.)  MCV  101  MCH 33.3  MCHC 33.1  RDW 14.1    14:14   Hemoglobin (CBC) 13.3 (Result(s) reported on 27 Apr 2012 at 02:36PM.)    20:21   Hemoglobin (CBC)  11.9 (Result(s) reported on 27 Apr 2012 at 08:33PM.)  10-Mar-14 01:37   Hemoglobin (CBC) -  Hemoglobin (CBC)  11.3  WBC (CBC) 10.4  RBC (CBC)  3.35  Hematocrit (CBC)  32.5  Platelet Count (CBC)  104  MCV 97  MCH 33.8  MCHC 34.9  RDW  15.3  Neutrophil % 46.1  Lymphocyte % 44.8  Monocyte % 7.0  Eosinophil % 1.5  Basophil % 0.6  Neutrophil # 4.8  Lymphocyte #  4.7  Monocyte # 0.7  Eosinophil # 0.2  Basophil # 0.1 (Result(s) reported on 28 Apr 2012 at 02:09AM.)    07:34   Hemoglobin (CBC)  11.1 (Result(s) reported on 28 Apr 2012 at 08:48AM.)    14:37   Hemoglobin (CBC)  11.2 (Result(s) reported on 28 Apr 2012 at 03:09PM.)    20:03   Hemoglobin (CBC)  10.8  WBC (CBC) 10.5  RBC (CBC)  3.32  Hematocrit (CBC)  32.7  Platelet Count (CBC)  105  MCV 99  MCH 32.5  MCHC 32.9  RDW  15.3  Neutrophil % 50.5  Lymphocyte % 38.9  Monocyte % 8.0  Eosinophil % 2.2  Basophil % 0.4  Neutrophil # 5.3  Lymphocyte #  4.1  Monocyte # 0.8  Eosinophil # 0.2  Basophil # 0.0 (Result(s) reported on 28 Apr 2012 at 08:50PM.)  11-Mar-14 04:18   Hemoglobin (CBC)  10.8  WBC (CBC) 8.3  RBC (CBC)  3.22  Hematocrit (CBC)  31.5  Platelet Count (CBC)  104  MCV 98  MCH 33.5  MCHC 34.2  RDW  15.1  Neutrophil % 44.6  Lymphocyte % 45.0  Monocyte % 7.0  Eosinophil % 2.7  Basophil % 0.7  Neutrophil # 3.7  Lymphocyte #  3.7  Monocyte # 0.6  Eosinophil # 0.2  Basophil # 0.1 (Result(s) reported on 29 Apr 2012 at 04:42AM.)    11:25   Hemoglobin (CBC)   11.5 (Result(s) reported on 29 Apr 2012 at 12:25PM.)  17:00   Hemoglobin (CBC)  11.4 (Result(s) reported on 29 Apr 2012 at 05:07PM.)   PERTINENT RADIOLOGY STUDIES: [place]:    11-Mar-14 10:52, US Abdomen Limited Survey  US Abdomen Limited Survey   REASON FOR EXAM:    evaluate liver. Has esophageal varices  COMMENTS:       PROCEDURE: [place]  - US ABDOMEN LIMITED SURVEY  - Apr 29 2012 10:52AM     RESULT: Limited right upper quadrant abdominal sonogram is performed. The   visualized portions of the pancreas appear normal. The tail and head are   not well seen. The liver shows lobulated contours and a coarse   heterogeneous echotexture. The liver length is normal at 15.64 cm. There   does appear to be partially visualized right pleural effusion. The   gallbladder is filled with sludge and multiple echogenic stones. The   gallbladder wall is thickened measuring up to 6.3 mm and edematous. There   is reported a negative sonographic Murphy's sign by the technologist. All   the portal venous flow is normal. Common bile duct diameter is abnormally   dilated to 8.8 mm.  IMPRESSION:   1. Cholelithiasis. Common bile duct dilation. Moderately large amount of   sludge is seen within the gallbladder. The gallbladder appears edematous.   There, interestingly, a negative sonographic Murphy's sign. Surgical   consultation is recommended.  2. The liver shows a somewhat nodular contour and coarse echotexture.   Correlate for underlying cirrhosis.    Dictation Site: 2(*)        Verified By: Sundra Aland, M.D., MD  Nuclear Med:    12-Mar-14 12:00, Hepatobiliary Image - Nuc Med  Hepatobiliary Image - Nuc Med   REASON FOR EXAM:    gallstones and abnormal lft's  COMMENTS:       PROCEDURE: NM  - NM HEPATOBILIARY IMAGE  - Apr 30 2012 12:00PM     RESULT: The patient received a dose of 8.687 mCi of technetium 37m  Choletec with prompt extraction of tracer from the blood stream by the   liver.  Gallbladder activity is seen at 10 minutes and increases   throughout the study. Small bowel activity was present at 70 minutes this   is increasing at 80 and 90 minutes.    IMPRESSION:   1. Minimally delayed activity in the is small bowel. Hepatocellular   function appears normal visually. Cystic duct is patent.  Dictation Site: 2        Verified By: GSundra Aland M.D., MD   Hospital Course:  Hospital Course 69year old female admitted by the hospitalists with hypovolemic shock and ugi bleed. She has been stable since admission with bp up after fluid and transfusion of prbc's without further hematemasis on protonix. Seen by gi and egd showed gastric ulcers and esophageal varices. [place] then done with gallstones and ? dilated ducts. HIDA was negative for obstruction and she was eating well so she will be discharged home. Dc pravastatin with potential liver disease. Will have surgery see her soon to consider choleycytectomy and concominant liver bx. Gi and myself to see soon as well. Note it took 32 min for dc tasks today   Condition on Discharge Good   DISCHARGE INSTRUCTIONS HOME MEDS:  Medication Reconciliation: Patient's Home Medications at Discharge:     Medication Instructions  vitamin d3 400 intl units oral tablet  1 tab(s) orally every other day   metformin extended release 500 mg oral tablet, extended  release  3 tab(s) orally once a day   estradiol 1 mg oral tablet  0.5 tab(s) orally once a day   pantoprazole 40 mg oral delayed release tablet  1 tab(s) orally 2 times a day     Physician's Instructions:  Diet Carbohydrate Controlled (ADA) Diet   Activity Limitations As tolerated   Return to Work Not Applicable   Time frame for Follow Up Appointment see orders   Electronic Signatures: Kirk Ruths (MD)  (Signed 13-Mar-14 08:11)  Authored: ADMISSION DATE AND DIAGNOSIS, CHIEF COMPLAINT/HPI, PERTINENT LABS, PERTINENT RADIOLOGY STUDIES, HOSPITAL COURSE,  DISCHARGE INSTRUCTIONS HOME MEDS, PATIENT INSTRUCTIONS   Last Updated: 13-Mar-14 08:11 by Kirk Ruths (MD)

## 2014-06-11 NOTE — Consult Note (Signed)
PATIENT NAME:  Gloria Cole, Gloria Cole MR#:  [id number] DATE OF BIRTH:  [birth year]  DATE OF ADMISSION:  04/27/2012  DATE OF CONSULTATION:  04/27/2012  CONSULTING PHYSICIAN:  [name]. Candace Cruise, MD  REASON FOR REFERRAL:  Hematemesis.   DISCUSSION:  The patient is a 69 year old white female with no significant past medical history except for hypertension, who came into the Emergency Room vomiting blood acutely; that started this morning. She had some epigastric pain prior to the onset of vomiting. The patient denies any prior history of ulcers or heartburn disease, or nausea or indigestion. She does take aspirin daily but denies any other NSAID use. She normally does not drink, but drinks only occasionally. The patient vomited at least a pint of blood in the Emergency Room. She was hypotensive with systolic blood pressure in the 70s to 80s, and she was tachycardic. Immediately, IV fluid was started and 2 units of blood was ordered stat. It was infused while in the Emergency Room. NG tube was placed, and she had at least 300 to 500 mL of blood that was suctioned off. By the time I saw her, the bleeding has stopped. Her blood pressure had stabilized with systolic blood pressure in the 120s and heart rate in the 90s.   PAST MEDICAL HISTORY:  The patient does have a history of hypertension and diabetes, and she is on medications for that. She is on no allergy medications.   SOCIAL HISTORY:  She does smoke, and drinks alcohol occasionally.   PHYSICAL EXAMINATION: GENERAL:  The patient is in no acute distress right now.  VITAL SIGNS:  Stable at this point. She is afebrile.  HEENT:  Normocephalic, atraumatic head. Pupils are equally reactive. Throat was clear.  NECK:  Supple.  CARDIAC:  Revealed regular rhythm and rate without murmurs.  LUNGS:  Clear bilaterally.  ABDOMEN:  Showed normoactive bowel sounds, soft. It was nontender right now, although she points to the epigastrium as the source of discomfort. There is no  hepatomegaly. She has active bowel sounds.  EXTREMITIES:  Show no clubbing, cyanosis or edema.   NEUROLOGIC:  Nonfocal.  SKIN:  Negative.   LABORATORY, DIAGNOSTIC and RADIOLOGICAL DATA: Initial blood draw showed a sodium of 140, potassium 3.5, chloride 108, CO2 of 21, BUN 26, creatinine is 0.96, glucose 161. Liver enzymes showed an AST of 46, albumin 2.8. Troponin level is normal. Initial white count is 11.9, hemoglobin is 12.2 before she was transfused and hydrated. B12 level is normal, folic acid is pending.   ASSESSMENT AND PLAN:  This is a patient with acute upper gastrointestinal bleeding. The bleeding appears to have stopped. The patient probably has a bleeding ulcer. We will need to monitor her hemoglobin carefully. She was in hypovolemic shock, and we need to make sure she is well hydrated.  I do expect the hemoglobin to fall as she is hydrated. The patient was already placed on a Protonix drip. We will plan for gastroscopy tomorrow. If for any reason the patient has recurrent active bleeding, then we may have to scope her sooner than later. We will keep the NG tube in for intermittent suction.   Thank you for the referral.      ____________________________ [name]. Candace Cruise, MD pyo:dmm D: 04/28/2012 08:44:00 ET T: 04/28/2012 10:32:23 ET JOB#: [id number]  cc: [name]. Candace Cruise, MD, <Dictator> [name] OH MD ELECTRONICALLY SIGNED 04/29/2012 10:54

## 2014-06-11 NOTE — Consult Note (Signed)
Pt seen and examined. Full consult to follow. Recent UGI bleeding hx. Had esophageal varices and gastric ulcers on EGD. Had lap choly done earlier today. Liver bx performed. Intraop cholangiogram done. No flow of contrast into duodenum. Pt still in PACU and quite somnolent. Unable to talk to her about ERCP at this time. Will try again later.    Electronic Signatures: Verdie Shire (MD) (Signed on 24-Apr-14 13:57)  Authored   Last Updated: 24-Apr-14 14:12 by Verdie Shire (MD)

## 2014-06-11 NOTE — Consult Note (Signed)
Pt seen and examined. Full consult to follow. Acute UGI bleeding with hypotension. IVF and 2 units of PRBC urgently transfused. NG placed and about 300-500cc of blood aspirated. No prior GI hx. On ASA daily. No specific GI sxs until now. Moniter hgb closely. Continue IVF. Continue protonix iv. Continue NG to interm suction. Plan EGD tomorrow unless patient has active bleeding again with hemodynamic instability. Thanks.  Electronic Signatures: Verdie Shire (MD)  (Signed on 09-Mar-14 13:58)  Authored  Last Updated: 09-Mar-14 13:58 by Verdie Shire (MD)

## 2014-06-11 NOTE — Consult Note (Signed)
Pt still sound asleep. Not alert enough to get consent for ERCP. Will give clear liquid diet but NPO after MN. Will plan ERCP in AM. THanks.  Electronic Signatures: Verdie Shire (MD)  (Signed on 24-Apr-14 14:19)  Authored  Last Updated: 24-Apr-14 14:19 by Verdie Shire (MD)

## 2014-06-11 NOTE — Consult Note (Signed)
ERCP performed. Esphageal varices still present. Linear gastric ulcers no longer seen. Normal ampulla with good bile drainage. Did cannulate PD 1st but able to cannulate CBD afterwards. Normal appearing CBD but stone present in distal CBD, which was extracted after biliary sphincterotomy and balloon sweep. Clear liquid ordered. If stable, ok for discharge later today on low fat diet starting tomorrow. If patient still signif pain and nausea, check lipase level for pancreatitis. If patient requires further hospital stay, then contact Dr. Gustavo Lah who is on call this weekend. Thanks.  Electronic Signatures: Verdie Shire (MD)  (Signed on 25-Apr-14 12:15)  Authored  Last Updated: 25-Apr-14 12:15 by Verdie Shire (MD)

## 2014-06-11 NOTE — Consult Note (Signed)
Chief Complaint:  Subjective/Chief Complaint No more bleeding. Hgb stable. Vague abd pain. Tolerating diet. U/S with gallstones and chronic liver disease. HIDA scan neg.   VITAL SIGNS/ANCILLARY NOTES: **Vital Signs.:   12-Mar-14 14:05  Vital Signs Type Routine  Temperature Temperature (F) 98.3  Celsius 36.8  Temperature Source oral  Pulse Pulse 71  Respirations Respirations 20  Systolic BP Systolic BP 892  Diastolic BP (mmHg) Diastolic BP (mmHg) 79  Mean BP 98  Pulse Ox % Pulse Ox % 95  Pulse Ox Activity Level  At rest  Oxygen Delivery Room Air/ 21 %   Brief Assessment:  Cardiac Regular   Respiratory clear BS   Gastrointestinal mild upper abd tenderness   Lab Results: Routine Hem:  11-Mar-14 17:00   Hemoglobin (CBC)  11.4 (Result(s) reported on 29 Apr 2012 at 05:07PM.)   Radiology Results: [place]:    11-Mar-14 10:52, US Abdomen Limited Survey  US Abdomen Limited Survey   REASON FOR EXAM:    evaluate liver. Has esophageal varices  COMMENTS:       PROCEDURE: [place]  - US ABDOMEN LIMITED SURVEY  - Apr 29 2012 10:52AM     RESULT: Limited right upper quadrant abdominal sonogram is performed. The   visualized portions of the pancreas appear normal. The tail and head are   not well seen. The liver shows lobulated contours and a coarse   heterogeneous echotexture. The liver length is normal at 15.64 cm. There   does appear to be partially visualized right pleural effusion. The   gallbladder is filled with sludge and multiple echogenic stones. The   gallbladder wall is thickened measuring up to 6.3 mm and edematous. There   is reported a negative sonographic Murphy's sign by the technologist. All   the portal venous flow is normal. Common bile duct diameter is abnormally   dilated to 8.8 mm.  IMPRESSION:   1. Cholelithiasis. Common bile duct dilation. Moderately large amount of   sludge is seen within the gallbladder. The gallbladder appears edematous.   There, interestingly, a  negative sonographic Murphy's sign. Surgical   consultation is recommended.  2. The liver shows a somewhat nodular contour and coarse echotexture.   Correlate for underlying cirrhosis.    Dictation Site: 2(*)        Verified By: Sundra Aland, M.D., MD  Nuclear Med:    12-Mar-14 12:00, Hepatobiliary Image - Nuc Med  Hepatobiliary Image - Nuc Med   REASON FOR EXAM:    gallstones and abnormal lft's  COMMENTS:       PROCEDURE: NM  - NM HEPATOBILIARY IMAGE  - Apr 30 2012 12:00PM     RESULT: The patient received a dose of 8.687 mCi of technetium 61m   Choletec with prompt extraction of tracer from the blood stream by the   liver. Gallbladder activity is seen at 10 minutes and increases   throughout the study. Small bowel activity was present at 70 minutes this   is increasing at 80 and 90 minutes.    IMPRESSION:   1. Minimally delayed activity in the is small bowel. Hepatocellular   function appears normal visually. Cystic duct is patent.  Dictation Site: 2        Verified By: Sundra Aland, M.D., MD   Assessment/Plan:  Assessment/Plan:  Assessment UGI bleed. Stable. Continue ppi bid and nadolol 40mg  daily. Gallstones/liver disease   Plan Since HIDA scan neg, no urgent need for surgery. However, would consider surgical evaluation  anyway for elective GB surgery. Due to possible liver condition, may be higher risk for surgery. If surgery refuses surgery, then liver bx with radiology may be benefial. If surgery is contemplated, then liver bx can be done at the time of surgery. All of this w/u can be done as outpt. Thanks.   Electronic Signatures: Verdie Shire (MD)  (Signed 12-Mar-14 15:50)  Authored: Chief Complaint, VITAL SIGNS/ANCILLARY NOTES, Brief Assessment, Lab Results, Radiology Results, Assessment/Plan   Last Updated: 12-Mar-14 15:50 by Verdie Shire (MD)

## 2014-06-11 NOTE — Consult Note (Signed)
Chief Complaint:  Subjective/Chief Complaint Much more alert this AM. Sore from surgery. LFT sl up from yest.   VITAL SIGNS/ANCILLARY NOTES: **Vital Signs.:   25-Apr-14 05:26  Vital Signs Type Routine  Temperature Temperature (F) 98  Celsius 36.6  Temperature Source oral  Pulse Pulse 68  Respirations Respirations 18  Systolic BP Systolic BP 620  Diastolic BP (mmHg) Diastolic BP (mmHg) 66  Mean BP 81  Pulse Ox % Pulse Ox % 99  Pulse Ox Activity Level  At rest  Oxygen Delivery 2L   Brief Assessment:  Cardiac Regular   Respiratory clear BS   Gastrointestinal mildly tender   Lab Results: Hepatic:  25-Apr-14 04:49   Bilirubin, Total  1.5  Alkaline Phosphatase 107  SGPT (ALT) 65  SGOT (AST)  120  Total Protein, Serum 7.1  Albumin, Serum  2.6  Routine Chem:  25-Apr-14 04:49   Glucose, Serum 94  BUN 7  Creatinine (comp) 0.75  Sodium, Serum 141  Potassium, Serum 4.0  Chloride, Serum  109  CO2, Serum 27  Calcium (Total), Serum  8.4  Osmolality (calc) 279  eGFR (African American) >60  eGFR (Non-African American) >60 (eGFR values <30m/min/1.73 m2 may be an indication of chronic kidney disease (CKD). Calculated eGFR is useful in patients with stable renal function. The eGFR calculation will not be reliable in acutely ill patients when serum creatinine is changing rapidly. It is not useful in  patients on dialysis. The eGFR calculation may not be applicable to patients at the low and high extremes of body sizes, pregnant women, and vegetarians.)  Anion Gap  5  Routine Coag:  25-Apr-14 04:49   Prothrombin  16.5  INR 1.3 (INR reference interval applies to patients on anticoagulant therapy. A single INR therapeutic range for coumarins is not optimal for all indications; however, the suggested range for most indications is 2.0 - 3.0. Exceptions to the INR Reference Range may include: Prosthetic heart valves, acute myocardial infarction, prevention of  myocardial infarction, and combinations of aspirin and anticoagulant. The need for a higher or lower target INR must be assessed individually. Reference: The Pharmacology and Management of the Vitamin K  antagonists: the seventh ACCP Conference on Antithrombotic and Thrombolytic Therapy. CBTDHR.4163Sept:126 (3suppl): [id number] A HCT value >55% may artifactually increase the PT.  In one study,  the increase was an average of 25%. Reference:  "Effect on Routine and Special Coagulation Testing Values of Citrate Anticoagulant Adjustment in Patients with High HCT Values." American Journal of Clinical Pathology 2006;126:400-405.)   Radiology Results: XRay:    24-Apr-14 10:00, Cholangiogram Operative  Cholangiogram Operative   REASON FOR EXAM:    Cholelithiasis  COMMENTS:       PROCEDURE: DXR - DXR CHOLANGIOGRAM OP (INITIAL)  - Jun 12 2012 10:00AM     RESULT: Two spot films from an operative cholangiogram are submitted. The   visualized portions of the intrahepatic ducts appear normal. There is   contrast in the cystic duct and common bile duct. No duodenal contrast is   demonstrated. One of the films demonstrates a relatively abrupt cutoff of   contrast in the distal common bile duct and a small filling defect may be   present here.    IMPRESSION:  I cannot absolutely exclude a common bile duct stone in the   distal aspect of the duct, but the duct itself does not appear abnormally   dilated. Further interpretation is deferred to Dr. BBary Castilla   Dictation Site: 2  Verified By: DAVID A. Martinique, M.D., MD   Assessment/Plan:  Assessment/Plan:  Assessment Positive IOC. S/P GB.   Plan Discussed ERCP in detail. Discussed potential risks incl pancreatitis. Pt agreed. Will plan ERCP this AM. Thanks   Electronic Signatures: Verdie Shire (MD)  (Signed 25-Apr-14 09:06)  Authored: Chief Complaint, VITAL SIGNS/ANCILLARY NOTES, Brief Assessment, Lab Results, Radiology Results,  Assessment/Plan   Last Updated: 25-Apr-14 09:06 by Verdie Shire (MD)

## 2014-06-11 NOTE — Consult Note (Signed)
PATIENT NAME:  Gloria Cole, SCRIPTER MR#:  [id number] DATE OF BIRTH:  07-05-1945  DATE OF CONSULTATION:  06/13/2012  REFERRING PHYSICIAN:   CONSULTING PHYSICIAN:  [name]. Antion Andres, MD  REASON FOR REFERRAL:  Has abnormal intraoperative cholangiogram.   DESCRIPTION:  The patient is a 69 year old black female who was initially admitted on March 9th due to hematemesis associated with nausea. She was initially hypotensive and given fluid and blood transfusion and I was consulted because of the upper GI bleeding. She subsequently had an upper endoscopy the following day on March 10th which showed evidence of grade 2 esophageal varices and linear gastric ulcers. The patient was started on a proton pump inhibitor. During the workup she was noted to have gallstones. And abnormal liver enzymes as well. She was then seen by Dr. Bary Castilla for gallbladder surgery which was performed yesterday. Yesterday on April 24th, laparoscopic cholecystectomy was performed. The patient had gallstones within the gallbladder. The liver looked very abnormal and appeared cirrhotic. Liver biopsies were performed as well. At the time, intraoperative cholangiogram was done. The bile duct look normal. However, the contrast would not drain into the duodenum at all. This is why I was consulted for possible ERCP. After the procedure, I tried to talk to the patient but she was to somnolent to get any history or get consent for the ERCP.   Today, I saw her this morning. She is sore from the surgery but otherwise feeling well. There is no heartburn, indigestion or nausea or vomiting at this time.   REVIEW OF SYSTEMS:  There are no fevers or chills. No weight gain or weight loss. There is no chest pain or palpitations, cough, no shortness of breath. There is no gross hematochezia or melena. The rest of the review of systems is negative.   PAST MEDICAL HISTORY:  Hypertension, diabetes and hyperlipidemia.   MEDICATIONS INCLUDE:  Metformin, pravastatin, and  some estrogen.   ALLERGIES:  PENICILLIN.   FAMILY HISTORY:  Notable for hypertension and diabetes. She smokes but drinks only occasionally.   PHYSICAL EXAMINATION:  GENERAL:  The patient is in no acute distress. She is afebrile. Her vital signs are stable.  HEENT:  Normocephalic, atraumatic head. Pupils are equally reactive. Throat was clear.  NECK:  Supple.  CARDIAC:  Regular rhythm and rate without murmurs.  LUNGS:  Clear bilaterally.  ABDOMEN:  Normoactive bowel sounds, soft. There is some mild tenderness especially at the surgical site there is some port where the laparoscopy was done. She has no palpable masses.  EXTREMITIES:  No clubbing, cyanosis, or edema.  NEUROLOGIC:  Nonfocal.  SKIN:  Negative except for the scar tissue from the surgery.   LABORATORY, DIAGNOSTIC, AND RADIOLOGICAL DATA:  Yesterday total bilirubin was 0.7, alk phos 103, AST 61, and ALT 33. Today, total bilirubin is 1.5, alk phos 107, AST is 120 and ALT 165. Sodium 140, potassium 4.0, chloride 109, CO2 27, BUN 7, creatinine is 0.75, glucose of 94. Yesterday white count was 7.3, hemoglobin was 13.5, platelet count was 144. INR is 1.3 today.   ASSESSMENT AND PLAN:  This is a patient with probable cirrhosis who has had  laparoscopic cholecystectomy yesterday, now has positive intraoperative cholangiogram suggesting a stone in the distal common bile duct. I discussed in detail with the patient regarding the ERCP. I also discussed t he potential risks including bleeding and pancreatitis. The patient agreed to have the procedure done. We will go ahead with the procedure later this morning. The patient  will be given prophylactic antibiotics. If everything goes well, without any complication, she can be discharged later today. However, if there is concern for possible pancreatitis then she may need to stay afterwards. Thank you for the referral.   ____________________________ [name]. Candace Cruise, MD pyo:jm D: 06/13/2012 09:47:57  ET T: 06/13/2012 10:59:46 ET JOB#: [id number]  cc: [name]. Candace Cruise, MD, <Dictator> [name] Fathima Bartl MD ELECTRONICALLY SIGNED 06/16/2012 9:00

## 2014-06-11 NOTE — H&P (Signed)
PATIENT NAME:  Gloria Cole, Gloria Cole MR#:  [id number] DATE OF BIRTH:  12/21/1945  DATE OF ADMISSION:  04/27/2012  PRIMARY CARE PHYSICIAN:  Dr. Ouida Sills  CHIEF COMPLAINT:  "I am vomiting bright red blood since 3 o'clock in the morning."   HISTORY OF PRESENT ILLNESS:  The patient is a 69 year old Caucasian female with past medical history of hypertension and diabetes who comes to the Emergency Room after she started having a significant amount of hematemesis which started around 3:00 in the morning that woke her up having a feeling of nausea. The patient had a couple of episodes of bright red blood at home, came to the Emergency Room and was found to be in severe hypovolemic shock with blood pressure systolic into the 71I and 60s. She was given a 2 liter wide open bolus of IV fluids emergently and transfused 2 units of blood by Emergency Room physician, Dr. Reita Cliche, given the amount of hematemesis she had. She vomited here in the Emergency Room about 300 to 500 mL of bright red blood. During my evaluation, the patient was tachycardic with heart rate in the 90s. Blood pressure was much more stable with systolic at 458. She denies any recent use of NSAIDS, aspirin, any use of BC powders or alcohol use. She is being admitted for hypovolemic posthemorrhagic shock secondary to GI bleed.   PAST MEDICAL HISTORY:   1.  Hypertension.  2.  Diabetes, type 2.  3.  Hyperlipidemia.   MEDICATIONS:   1.  Estradiol 1 mg 1/2 tablet daily.  2.  Metformin extended release 500 mg 3 tablets once a day.  3.  Pravastatin 40 mg daily.  4.  Vitamin B12 p.o. once a week.  5.  Vitamin D3 at 400 units daily.   ALLERGIES:  PENICILLIN.   FAMILY HISTORY:  Positive for hypertension and diabetes.   SOCIAL HISTORY:  She smokes about 3/4 pack a day. She states she drinks alcohol occasionally.   REVIEW OF SYSTEMS:  CONSTITUTIONAL: Positive for fatigue, weakness.  EYES: No blurred or double vision, cataracts, glaucoma.  ENT: No  tinnitus, hearing loss, difficulty swallowing.  RESPIRATORY: No cough, wheeze, hemoptysis or dyspnea.  CARDIOVASCULAR: No chest pain, orthopnea or edema.  GASTROINTESTINAL: Positive for hematemesis and melena.  GENITOURINARY: No dysuria or hematuria.  ENDOCRINE: No polyuria, nocturia, thyroid problems.  HEMATOLOGY: No anemia or easy bruising.  SKIN: No acne or rash.  MUSCULOSKELETAL: No arthritis.  NEUROLOGIC: No CVA or TIA.  PSYCHIATRIC: No anxiety or depression. All other systems reviewed and are negative.   PHYSICAL EXAMINATION: GENERAL: The patient is awake, alert and oriented x 3.  VITAL SIGNS: Tachycardic, afebrile, heart rate in the 90s, blood pressure was 117/60 and sats are 97% on room air.  HEENT: Atraumatic, normocephalic. PERRLA. EOMs intact. Oral mucosa is dry.  NECK: Supple. No JVD. No carotid bruit.  RESPIRATORY: Clear to auscultation bilaterally. No rales, rhonchi, respiratory distress or labored breathing.  CARDIOVASCULAR: Both heart sounds are normal, tachycardia present. No murmur heard. PMI not lateralized. Chest is nontender.  EXTREMITIES: Feeble pedal pulses, good femoral pulses, no lower extremity edema.  ABDOMEN: Soft, benign. There is some mild tenderness present in the epigastric area. No abdominal mass, rigidity or guarding noted.  NEUROLOGIC: Grossly intact cranial nerves II through XII. No motor or sensory deficits.  PSYCHIATRIC: The patient is awake, alert, oriented x 3.  SKIN: Warm and dry.   LABORATORY DATA:  Troponin is less than 0.02. Hemoglobin and hematocrit are 12.2 and 36.8  and platelet count 142. Glucose is 161, BUN is 26, creatinine 0.96, sodium is 140, potassium 3.5, chloride 108 and bicarbonate is 21. SGOT is 46, total protein 7.6, albumin is 2.8 and magnesium is 1.3.   ASSESSMENT: A 69 year old female with history of diabetes and hyperlipidemia comes in with:  1.  Massive gastrointestinal bleed, etiology unknown, suspect peptic ulcer disease.  Rule out other causes of gastrointestinal bleed such as Mallory-Weiss tear and/or esophageal varices. The patient has epigastric pain and several episodes of hematemesis at home with more than 1 pint bleed here in the Emergency Room. She is currently on her second unit of emergency blood transfusion. Intravenous Protonix drip has been started. The patient denies any recent nonsteroidal antiinflammatory drug use. She also denies any alcohol binge drinking. She states that she drinks occasionally. She was a little hesitant to quantify the amount of drinking she does. Denies any history of gastroesophageal reflux disease or being on a proton pump inhibitor, but has occasional indigestion. The patient will be admitted to the Intensive Care Unit under Dr. Ouida Sills. She will be kept n.p.o. Nasogastric suction has been started. Dr. Candace Cruise was consulted and I spoke with him who will see the patient in consultation. We will continue monitoring hemoglobin and hematocrit every 6 hours, continue proton pump inhibitor drip and await further recommendations per gastroenterology.  2.  Severe hypovolemic shock due to gastrointestinal bleed. She received 2 liters of normal saline and 2 units of emergency blood transfusion. Blood pressure is currently stable. We will continue intravenous fluids.  3.  Type 2 diabetes. We will place the patient on sliding scale insulin.  4.  Deep vein thrombosis prophylaxis on thromboembolic disease stockings and sequential compression devices.  5.  Tobacco abuse. The patient is counseled on cessation. About 3 to 4 minutes spent in smoking cessation counseling.   The family was updated in the Emergency Room. Further workup according to the patient's clinical course.   CRITICAL TIME SPENT:  55 minutes.   ____________________________ Hart Rochester Posey Pronto, MD sap:si D: 04/28/2012 13:55:00 ET T: 04/28/2012 15:07:55 ET JOB#: [id number]  cc: Sona A. Posey Pronto, MD, <Dictator> Dr. Ishmael Holter  MD ELECTRONICALLY SIGNED 05/01/2012 15:10

## 2014-06-11 NOTE — Consult Note (Signed)
No further bleeding. Hgb stable. NG pulled. EGD showed gastric linear ulcers. Biopsies taken. Also has esophageal varices though no obvious site of recent bleeding. Start clear liquid diet. Start nadolol 40mg  daily as long as HR/SBP stable to decrease portal pressures. Please order liver U/S-done. Can switch to protonix po bid. I will be out in [facility] tomorrow. If patient discharged tomorrow, then can f/u with [place] as outpt. Will check back on Wed if patient still here. THanks.    Electronic Signatures: Verdie Shire (MD) (Signed on 10-Mar-14 12:12)  Authored   Last Updated: 10-Mar-14 13:20 by Verdie Shire (MD)

## 2014-07-04 IMAGING — US ABDOMEN ULTRASOUND LIMITED
1 series · 13 of 25 positions shown · non-contrast
Comparison: none

REASON FOR EXAM: evaluate liver. Has esophageal varices
COMMENTS:

[Series 1: abdomen ultrasound limited · 0.23mm/px · 13 of 109 slices shown]
[im 1/109]
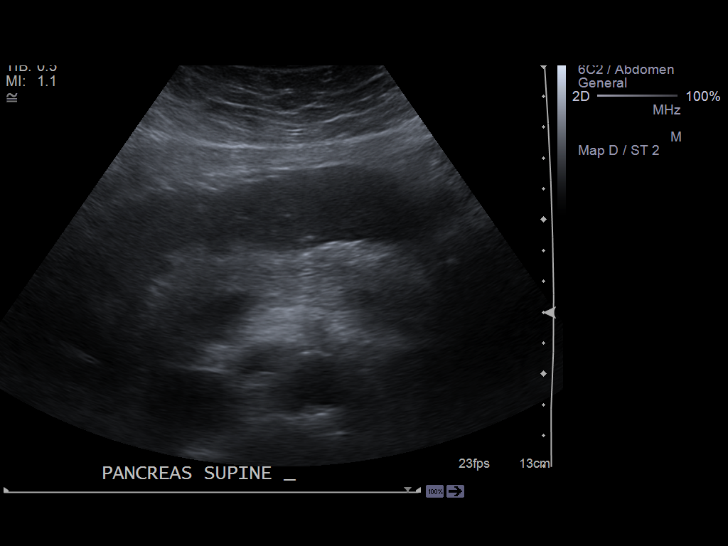
[im 10/109]
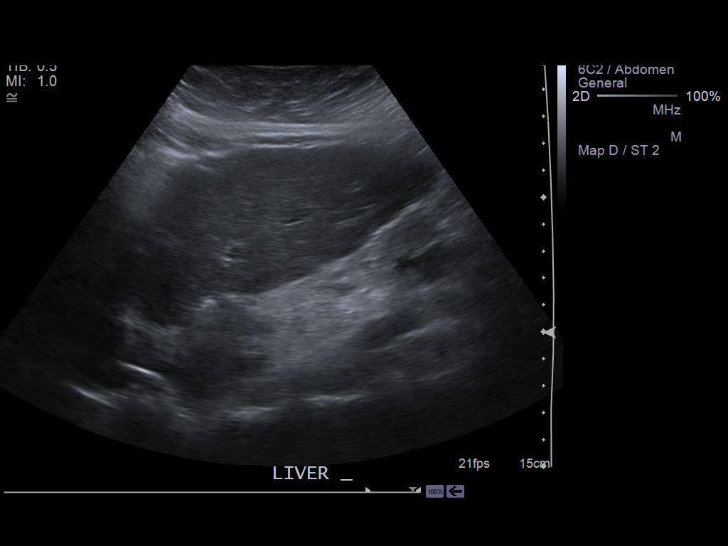
[im 19/109]
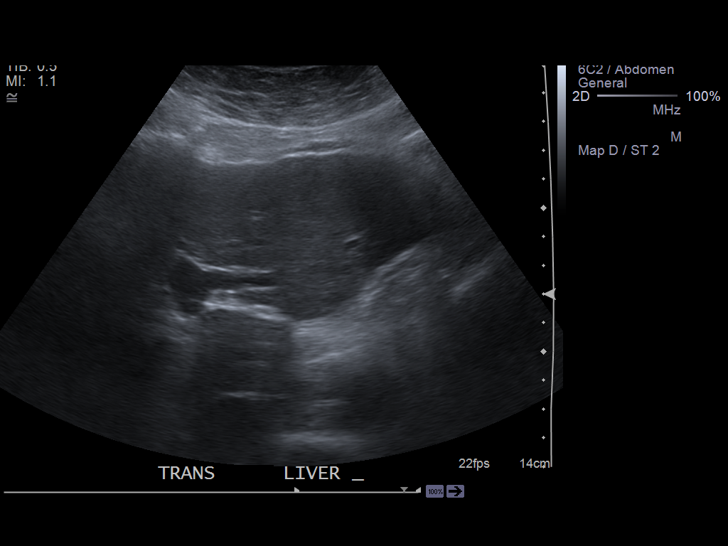
[im 28/109]
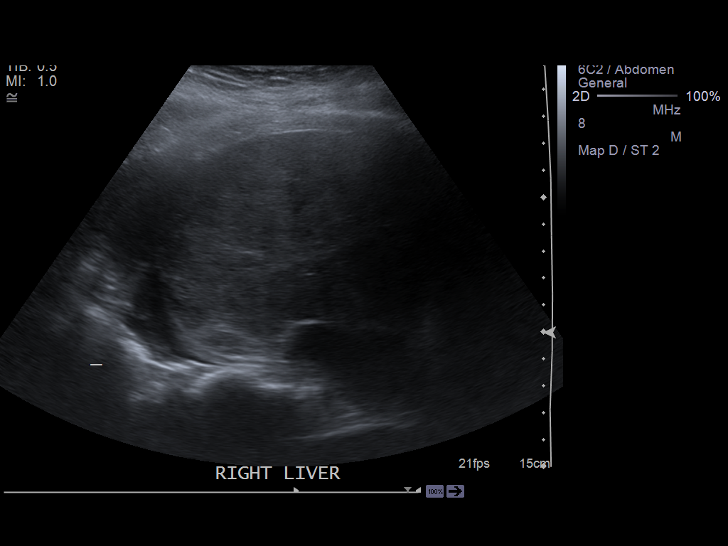
[im 37/109]
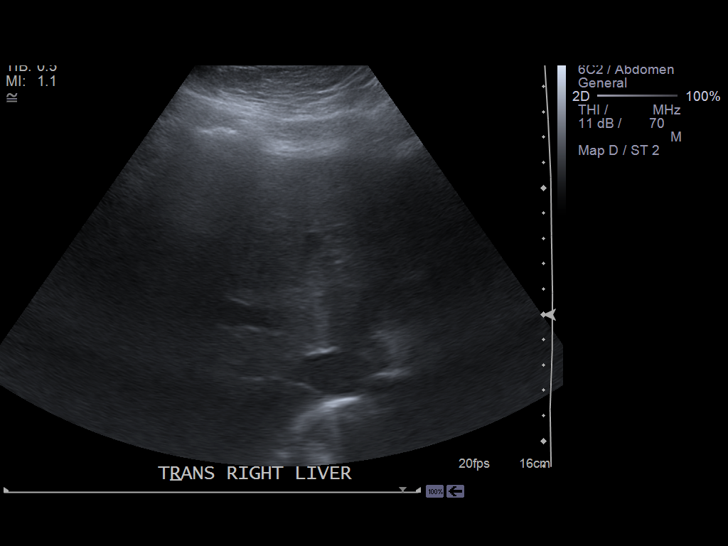
[im 46/109]
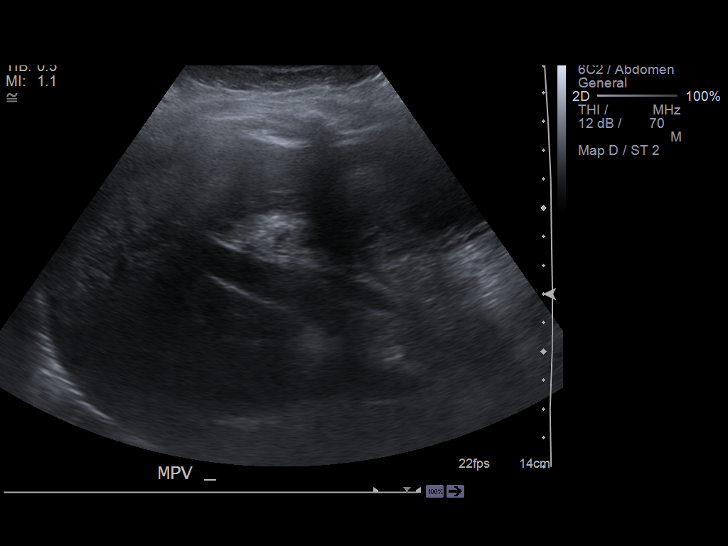
[im 55/109]
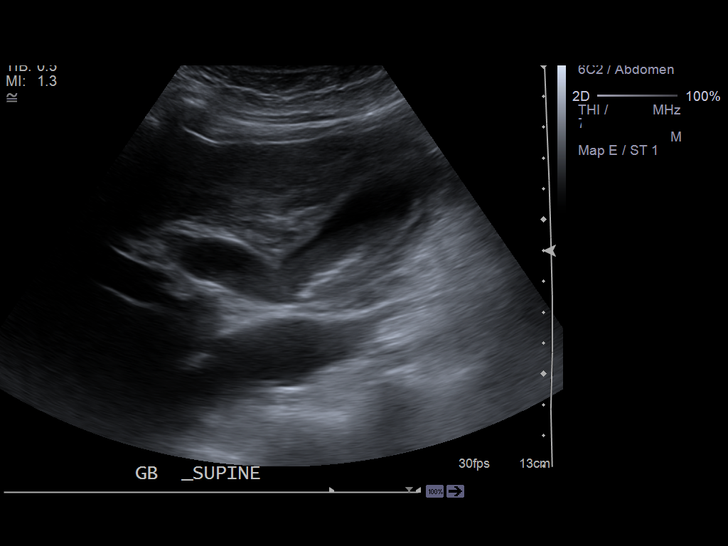
[im 64/109]
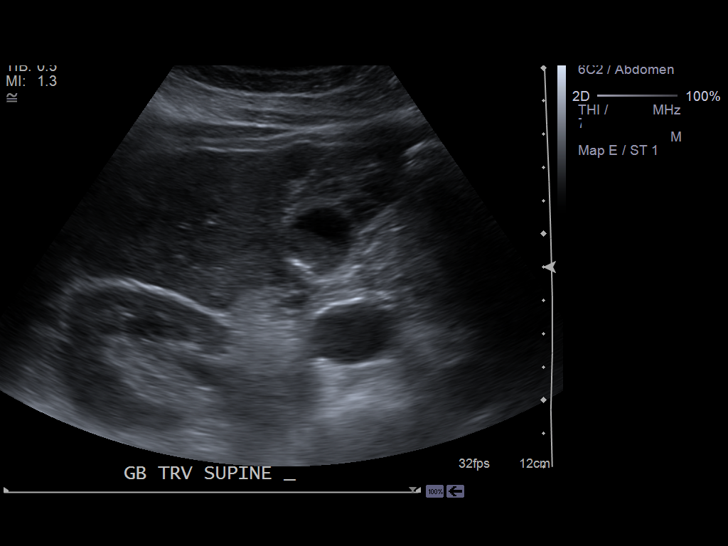
[im 73/109]
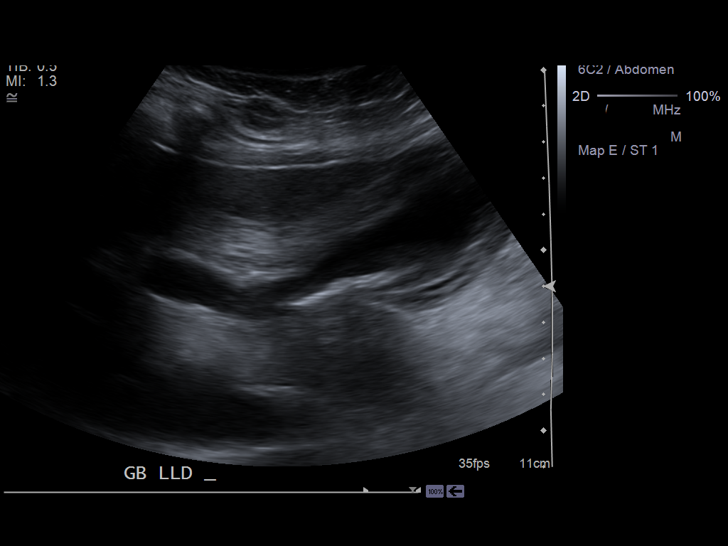
[im 82/109]
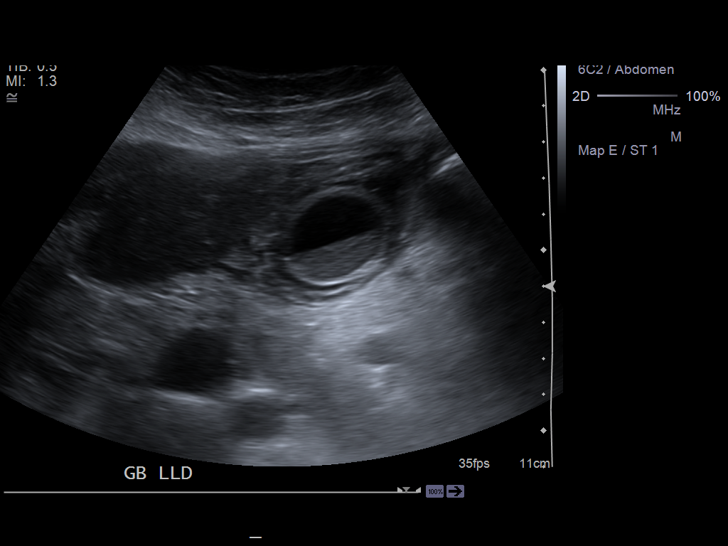
[im 91/109]
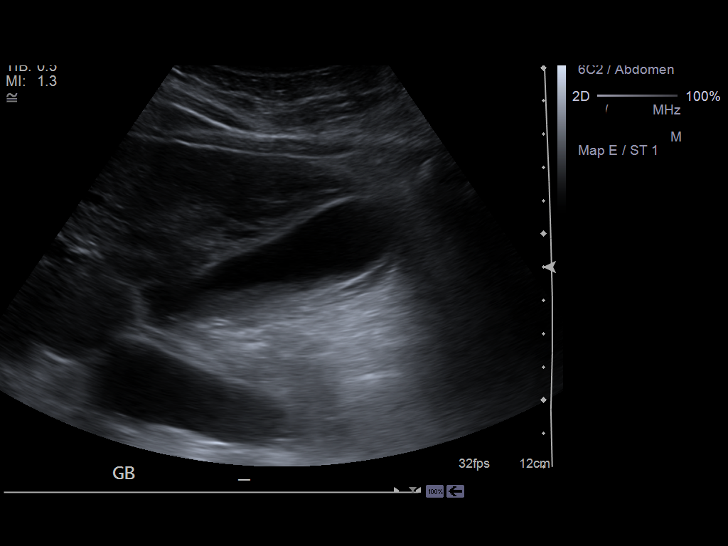
[im 100/109]
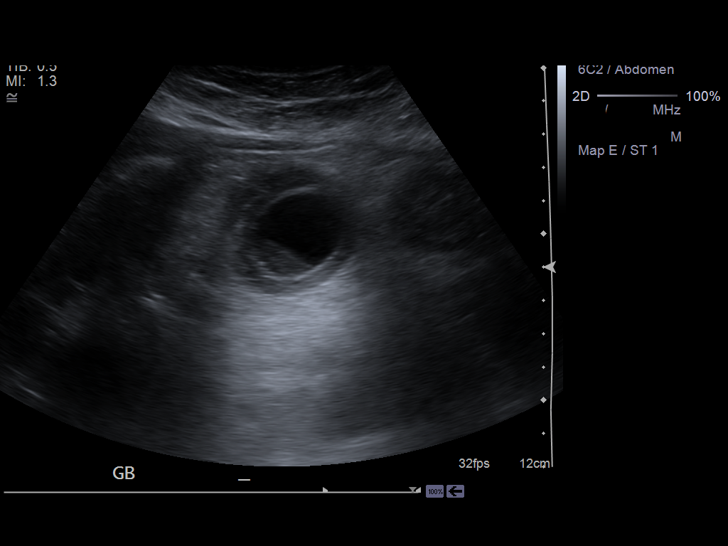
[im 109/109]
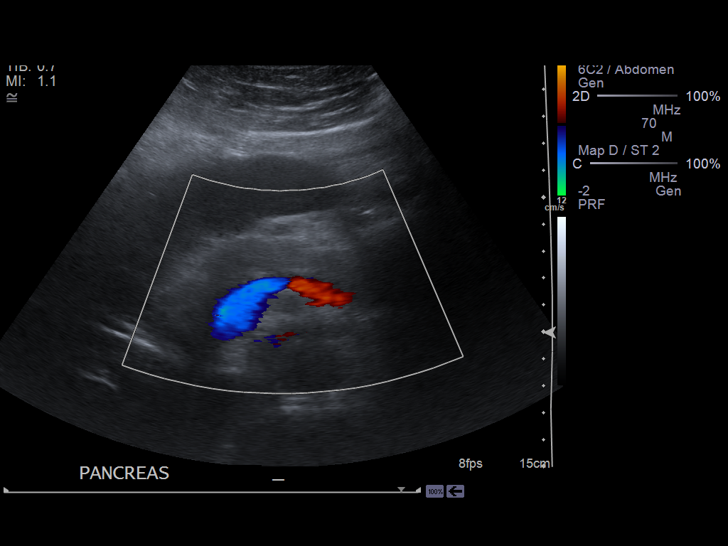

[13 of 25 positions shown; findings below may reference images not displayed]

PROCEDURE:     US  - US ABDOMEN LIMITED SURVEY  - April 29, 2012 [DATE]

RESULT:     Limited right upper quadrant abdominal sonogram is performed.
The visualized portions of the pancreas appear normal. The tail and head are
not well seen. The liver shows lobulated contours and a coarse heterogeneous
echotexture. The liver length is normal at 15.64 cm. There does appear to be
partially visualized right pleural effusion. The gallbladder is filled with
sludge and multiple echogenic stones. The gallbladder wall is thickened
measuring up to 6.3 mm and edematous. There is reported a negative
sonographic Murphy's sign by the technologist. All the portal venous flow is
normal. Common bile duct diameter is abnormally dilated to 8.8 mm.
IMPRESSION: 1. Cholelithiasis. Common bile duct dilation. Moderately large amount of
sludge is seen within the gallbladder. The gallbladder appears edematous.
There, interestingly, a negative sonographic Murphy's sign. Surgical
consultation is recommended.
2. The liver shows a somewhat nodular contour and coarse echotexture.
Correlate for underlying cirrhosis.

[REDACTED](*)

## 2014-10-06 ENCOUNTER — Encounter: Payer: Self-pay | Admitting: *Deleted

## 2014-10-07 ENCOUNTER — Ambulatory Visit
Admission: RE | Admit: 2014-10-07 | Discharge: 2014-10-07 | Disposition: A | Discharge status: Home / Self Care | Payer: Medicare Other | Source: Ambulatory Visit | Attending: Gastroenterology | Admitting: Gastroenterology

## 2014-10-07 ENCOUNTER — Encounter: Payer: Self-pay | Admitting: *Deleted

## 2014-10-07 ENCOUNTER — Ambulatory Visit: Payer: Medicare Other | Admitting: Anesthesiology

## 2014-10-07 ENCOUNTER — Encounter
Admission: RE | Disposition: A | Discharge status: Home / Self Care | Payer: Self-pay | Source: Ambulatory Visit | Attending: Gastroenterology

## 2014-10-07 DIAGNOSIS — I252 Old myocardial infarction: Secondary | ICD-10-CM | POA: Insufficient documentation

## 2014-10-07 DIAGNOSIS — I8500 Esophageal varices without bleeding: Secondary | ICD-10-CM | POA: Diagnosis not present

## 2014-10-07 DIAGNOSIS — K219 Gastro-esophageal reflux disease without esophagitis: Secondary | ICD-10-CM | POA: Insufficient documentation

## 2014-10-07 DIAGNOSIS — Z880 Allergy status to penicillin: Secondary | ICD-10-CM | POA: Diagnosis not present

## 2014-10-07 DIAGNOSIS — K7460 Unspecified cirrhosis of liver: Secondary | ICD-10-CM | POA: Insufficient documentation

## 2014-10-07 DIAGNOSIS — B1920 Unspecified viral hepatitis C without hepatic coma: Secondary | ICD-10-CM | POA: Diagnosis not present

## 2014-10-07 DIAGNOSIS — E119 Type 2 diabetes mellitus without complications: Secondary | ICD-10-CM | POA: Diagnosis not present

## 2014-10-07 DIAGNOSIS — K766 Portal hypertension: Secondary | ICD-10-CM | POA: Diagnosis not present

## 2014-10-07 DIAGNOSIS — K3189 Other diseases of stomach and duodenum: Secondary | ICD-10-CM | POA: Insufficient documentation

## 2014-10-07 DIAGNOSIS — K259 Gastric ulcer, unspecified as acute or chronic, without hemorrhage or perforation: Secondary | ICD-10-CM | POA: Diagnosis not present

## 2014-10-07 DIAGNOSIS — R1310 Dysphagia, unspecified: Secondary | ICD-10-CM | POA: Diagnosis present

## 2014-10-07 DIAGNOSIS — I10 Essential (primary) hypertension: Secondary | ICD-10-CM | POA: Insufficient documentation

## 2014-10-07 DIAGNOSIS — E785 Hyperlipidemia, unspecified: Secondary | ICD-10-CM | POA: Insufficient documentation

## 2014-10-07 DIAGNOSIS — F17210 Nicotine dependence, cigarettes, uncomplicated: Secondary | ICD-10-CM | POA: Diagnosis not present

## 2014-10-07 HISTORY — DX: Gastro-esophageal reflux disease without esophagitis: K21.9

## 2014-10-07 HISTORY — DX: Type 2 diabetes mellitus with other specified complication: E11.69

## 2014-10-07 HISTORY — DX: Essential (primary) hypertension: I10

## 2014-10-07 HISTORY — PX: ESOPHAGOGASTRODUODENOSCOPY (EGD) WITH PROPOFOL: SHX5813

## 2014-10-07 HISTORY — DX: Personal history of other diseases of the nervous system and sense organs: Z86.69

## 2014-10-07 HISTORY — DX: Hyperlipidemia, unspecified: E78.5

## 2014-10-07 HISTORY — DX: Personal history of other infectious and parasitic diseases: Z86.19

## 2014-10-07 LAB — GLUCOSE, CAPILLARY: GLUCOSE-CAPILLARY: 169 mg/dL — AB (ref 65–99)

## 2014-10-07 SURGERY — ESOPHAGOGASTRODUODENOSCOPY (EGD) WITH PROPOFOL
Anesthesia: General

## 2014-10-07 MED ORDER — PROPOFOL INFUSION 10 MG/ML OPTIME
INTRAVENOUS | Status: DC | PRN
  Administered 2014-10-07: 100 ug/kg/min via INTRAVENOUS

## 2014-10-07 MED ORDER — PHENYLEPHRINE HCL 10 MG/ML IJ SOLN
INTRAMUSCULAR | Status: DC | PRN
  Administered 2014-10-07 (×2): 50 ug via INTRAVENOUS

## 2014-10-07 MED ORDER — SODIUM CHLORIDE 0.9 % IV SOLN
INTRAVENOUS | Status: DC
  Administered 2014-10-07: 09:00:00 via INTRAVENOUS

## 2014-10-07 MED ORDER — FENTANYL CITRATE (PF) 100 MCG/2ML IJ SOLN
INTRAMUSCULAR | Status: DC | PRN
  Administered 2014-10-07: 50 ug via INTRAVENOUS

## 2014-10-07 MED ORDER — GLYCOPYRROLATE 0.2 MG/ML IJ SOLN
INTRAMUSCULAR | Status: DC | PRN
  Administered 2014-10-07: 0.1 mg via INTRAVENOUS

## 2014-10-07 MED ORDER — SODIUM CHLORIDE 0.9 % IV SOLN: INTRAVENOUS | Status: DC

## 2014-10-07 MED ORDER — ONDANSETRON HCL 4 MG/2ML IJ SOLN
INTRAMUSCULAR | Status: DC | PRN
  Administered 2014-10-07: 4 mg via INTRAVENOUS

## 2014-10-07 MED ORDER — LIDOCAINE HCL (CARDIAC) 20 MG/ML IV SOLN
INTRAVENOUS | Status: DC | PRN
  Administered 2014-10-07: 40 mg via INTRAVENOUS

## 2014-10-07 MED ORDER — MIDAZOLAM HCL 2 MG/2ML IJ SOLN
INTRAMUSCULAR | Status: DC | PRN
  Administered 2014-10-07: 1 mg via INTRAVENOUS

## 2014-10-07 NOTE — OR Nursing (Signed)
Patient given Zofran 4mg . Iv per anesthesia with relief.  Dr. Candace Cruise talked with patient at length.  And will go home with some residual pain.  Brother with pateint .  Now baseline lethargic, but alert and oriented.

## 2014-10-07 NOTE — Transfer of Care (Signed)
Immediate Anesthesia Transfer of Care Note  Patient: Gloria Cole  Procedure(s) Performed: Procedure(s): ESOPHAGOGASTRODUODENOSCOPY (EGD) WITH PROPOFOL (N/A)  Patient Location: PACU  Anesthesia Type:General  Level of Consciousness: awake and sedated  Airway & Oxygen Therapy: Patient Spontanous Breathing  Post-op Assessment: Report given to RN and Post -op Vital signs reviewed and stable  Post vital signs: Reviewed and stable  Last Vitals:  Filed Vitals:   10/07/14 0825  BP: 133/72  Pulse: 76  Temp: 36.6 C  Resp: 18    Complications: No apparent anesthesia complications none

## 2014-10-07 NOTE — Op Note (Signed)
[facility] Gastroenterology Patient Name: Gloria Cole Procedure Date: 10/07/2014 8:47 AM MRN: [id number] Account #: [id number] Date of Birth: [birth year] Admit Type: Outpatient Age: 69 Room: [facility] ENDO ROOM 4 Gender: Female Note Status: Finalized Procedure:         Upper GI endoscopy Indications:       Dysphagia, liver cirrhosis, hx of grade 1 esophageal                     varices in the past. Providers:         [name]. Candace Cruise, MD Referring MD:      Ocie Cornfield. Ouida Sills, MD (Referring MD) Medicines:         Monitored Anesthesia Care Complications:     No immediate complications. Procedure:         Pre-Anesthesia Assessment:                    - Prior to the procedure, a History and Physical was                     performed, and patient medications, allergies and                     sensitivities were reviewed. The patient's tolerance of                     previous anesthesia was reviewed.                    - The risks and benefits of the procedure and the sedation                     options and risks were discussed with the patient. All                     questions were answered and informed consent was obtained.                    - After reviewing the risks and benefits, the patient was                     deemed in satisfactory condition to undergo the procedure.                    After obtaining informed consent, the endoscope was passed                     under direct vision. Throughout the procedure, the                     patient's blood pressure, pulse, and oxygen saturations                     were monitored continuously. The Endoscope was introduced                     through the mouth, and advanced to the second part of                     duodenum. The upper GI endoscopy was accomplished without                     difficulty. The upper GI endoscopy was accomplished  without difficulty. The patient tolerated the procedure                      well. Findings:      Grade III varices were found in the lower third of the esophagus. They       were large in largest diameter. Six bands were successfully placed with       complete eradication, resulting in deflation of varices. There was no       bleeding during the procedure.      The exam was otherwise without abnormality.      Localized mildly erythematous mucosa without bleeding was found in the       gastric antrum. Biopsies were taken with a cold forceps for histology.      Moderate portal hypertensive gastropathy was found in the gastric fundus.      The exam was otherwise without abnormality.      The examined duodenum was normal. Impression:        - Grade III esophageal varices. Completely eradicated.                     Banded.                    - The examination was otherwise normal.                    - Erythematous mucosa in the antrum. Biopsied.                    - Portal hypertensive gastropathy.                    - The examination was otherwise normal.                    - Normal examined duodenum. Recommendation:    - Discharge patient to home.                    - Observe patient's clinical course.                    - Continue present medications.                    - The findings and recommendations were discussed with the                     patient.                    - Repeat the upper endoscopy in 2 months.                    - Full liquid diet today. Procedure Code(s): --- Professional ---                    [phone], Esophagogastroduodenoscopy, flexible, transoral;                     with band ligation of esophageal/gastric varices                    43239, Esophagogastroduodenoscopy, flexible, transoral;                     with biopsy, single or multiple Diagnosis Code(s): --- Professional ---  I85.00, Esophageal varices without bleeding                    K31.9, Disease of stomach and duodenum, unspecified                     K76.6, Portal hypertension                    K31.89, Other diseases of stomach and duodenum                    R13.10, Dysphagia, unspecified CPT copyright 2014 American Medical Association. All rights reserved. The codes documented in this report are preliminary and upon coder review may  be revised to meet current compliance requirements. Hulen Luster, MD 10/07/2014 9:04:20 AM This report has been signed electronically. Number of Addenda: 0 Note Initiated On: 10/07/2014 8:47 AM      [facility]

## 2014-10-07 NOTE — Anesthesia Procedure Notes (Signed)
Date/Time: 10/07/2014 8:22 AM Performed by: Vaughan Sine Pre-anesthesia Checklist: Patient identified, Emergency Drugs available, Suction available, Patient being monitored and Timeout performed Patient Re-evaluated:Patient Re-evaluated prior to inductionOxygen Delivery Method: Nasal cannula Preoxygenation: Pre-oxygenation with 100% oxygen Intubation Type: IV induction Airway Equipment and Method: Bite block Placement Confirmation: CO2 detector and positive ETCO2

## 2014-10-07 NOTE — Anesthesia Preprocedure Evaluation (Signed)
Anesthesia Evaluation  Patient identified by MRN, date of birth, ID band Patient awake    Reviewed: Allergy & Precautions, H&P , NPO status , Patient's Chart, lab work & pertinent test results, reviewed documented beta blocker date and time   History of Anesthesia Complications Negative for: history of anesthetic complications  Airway Mallampati: II  TM Distance: >3 FB Neck ROM: full    Dental no notable dental hx. (+) Edentulous Upper, Edentulous Lower, Upper Dentures, Lower Dentures   Pulmonary neg sleep apnea, neg COPDneg recent URI, Current Smoker,  breath sounds clear to auscultation  Pulmonary exam normal       Cardiovascular Exercise Tolerance: Good hypertension, - angina- CAD, - Past MI and - CABG Normal cardiovascular exam- dysrhythmias - Valvular Problems/MurmursRhythm:regular Rate:Normal     Neuro/Psych negative neurological ROS  negative psych ROS   GI/Hepatic GERD-  Medicated,(+) Hepatitis -, C  Endo/Other  diabetes, Well Controlled, Oral Hypoglycemic Agents  Renal/GU negative Renal ROS  negative genitourinary   Musculoskeletal   Abdominal   Peds  Hematology negative hematology ROS (+)   Anesthesia Other Findings Past Medical History:   Esophageal varices                              March 2014   Diabetes                                        2006         History of hepatitis C                                       History of encephalopathy                                    Hypertension                                                 Hyperlipidemia associated with type 2 diabetes*              GERD (gastroesophageal reflux disease)                       Reproductive/Obstetrics negative OB ROS                             Anesthesia Physical Anesthesia Plan  ASA: III  Anesthesia Plan: General   Post-op Pain Management:    Induction:   Airway Management Planned:    Additional Equipment:   Intra-op Plan:   Post-operative Plan:   Informed Consent: I have reviewed the patients History and Physical, chart, labs and discussed the procedure including the risks, benefits and alternatives for the proposed anesthesia with the patient or authorized representative who has indicated his/her understanding and acceptance.   Dental Advisory Given  Plan Discussed with: Anesthesiologist, CRNA and Surgeon  Anesthesia Plan Comments:         Anesthesia Quick Evaluation

## 2014-10-07 NOTE — H&P (Signed)
Primary Care Physician:  Kirk Ruths., MD Primary Gastroenterologist:  Dr. Candace Cruise  Pre-Procedure History & Physical: HPI:  Gloria Cole is a 69 y.o. female is here for an EGD with possible dilation.   Past Medical History  Diagnosis Date  . Esophageal varices March 2014  . Diabetes 2006  . History of hepatitis C   . History of encephalopathy   . Hypertension   . Hyperlipidemia associated with type 2 diabetes mellitus   . GERD (gastroesophageal reflux disease)     Past Surgical History  Procedure Laterality Date  . Abdominal hysterectomy  1994  . Colonoscopy  2010  . Upper gi endoscopy  2014  . Ercp  April 2014    Dr Candace Cruise  . Cholecystectomy  April 2014    Dr Bary Castilla    Prior to Admission medications   Medication Sig Start Date End Date Taking? Authorizing Provider  cholecalciferol (VITAMIN D) 400 UNITS TABS Take 400 Units by mouth daily.   Yes Historical Provider, MD  estradiol (ESTRACE) 1 MG tablet Take 1 tablet by mouth. 04/16/12  Yes Historical Provider, MD  glimepiride (AMARYL) 2 MG tablet Take 2 mg by mouth daily before breakfast.   Yes Historical Provider, MD  metFORMIN (GLUCOPHAGE-XR) 500 MG 24 hr tablet Take 1,500 mg by mouth daily with breakfast.   Yes Historical Provider, MD  Multiple Vitamins-Minerals (HM COMPLETE 50+) TABS Take 1 tablet by mouth daily.   Yes Historical Provider, MD  nadolol (CORGARD) 20 MG tablet  06/17/12  Yes Historical Provider, MD  ondansetron (ZOFRAN ODT) 8 MG disintegrating tablet Take 1 tablet (8 mg total) by mouth every 8 (eight) hours as needed for nausea. 07/03/12  Yes Robert Bellow, MD  pantoprazole (PROTONIX) 40 MG tablet Take 40 mg by mouth 2 (two) times daily.   Yes Historical Provider, MD  pravastatin (PRAVACHOL) 40 MG tablet  06/20/12  Yes Historical Provider, MD  vitamin B-12 (CYANOCOBALAMIN) 500 MCG tablet Take 500 mcg by mouth daily.   Yes Historical Provider, MD  potassium citrate (UROCIT-K) 10 MEQ (1080 MG) SR  tablet Take 10 mEq by mouth daily as needed.    Historical Provider, MD    Allergies as of 09/14/2014 - Review Complete 07/03/2012  Allergen Reaction Noted  . Penicillins Swelling 05/14/2012    History reviewed. No pertinent family history.  Social History   Social History  . Marital Status: Widowed    Spouse Name: N/A  . Number of Children: N/A  . Years of Education: N/A   Occupational History  . Not on file.   Social History Main Topics  . Smoking status: Current Some Day Smoker -- 1.00 packs/day for 30 years    Types: Cigarettes  . Smokeless tobacco: Not on file     Comment: 1 pack in 2 weeks  . Alcohol Use: Yes     Comment: occasionally  . Drug Use: No  . Sexual Activity: Not on file   Other Topics Concern  . Not on file   Social History Narrative    Review of Systems: See HPI, otherwise negative ROS  Physical Exam: BP 133/72 mmHg  Pulse 76  Temp(Src) 97.8 F (36.6 C) (Tympanic)  Resp 18  Ht 5\' 6"  (1.676 m)  Wt 67.132 kg (148 lb)  BMI 23.90 kg/m2  SpO2 100% General:   Alert,  pleasant and cooperative in NAD Head:  Normocephalic and atraumatic. Neck:  Supple; no masses or thyromegaly. Lungs:  Clear throughout to  auscultation.    Heart:  Regular rate and rhythm. Abdomen:  Soft, nontender and nondistended. Normal bowel sounds, without guarding, and without rebound.   Neurologic:  Alert and  oriented x4;  grossly normal neurologically.  Impression/Plan: Gloria Cole is here for an  EGD with possible dilation to be performed for dysphagia.  Risks, benefits, limitations, and alternatives regarding  EGD have been reviewed with the patient.  Questions have been answered.  All parties agreeable.   Gloria Cole, [name], MD  10/07/2014, 8:29 AM

## 2014-10-08 ENCOUNTER — Encounter: Payer: Self-pay | Admitting: Gastroenterology

## 2014-10-08 LAB — SURGICAL PATHOLOGY

## 2014-10-08 NOTE — Anesthesia Postprocedure Evaluation (Signed)
  Anesthesia Post-op Note  Patient: Gloria Cole  Procedure(s) Performed: Procedure(s): ESOPHAGOGASTRODUODENOSCOPY (EGD) WITH PROPOFOL (N/A)  Anesthesia type:General  Patient location: PACU  Post pain: Pain level controlled  Post assessment: Post-op Vital signs reviewed, Patient's Cardiovascular Status Stable, Respiratory Function Stable, Patent Airway and No signs of Nausea or vomiting  Post vital signs: Reviewed and stable  Last Vitals:  Filed Vitals:   10/07/14 1005  BP: 103/51  Pulse:   Temp:   Resp: 23    Level of consciousness: awake, alert  and patient cooperative  Complications: No apparent anesthesia complications

## 2014-11-17 ENCOUNTER — Encounter: Payer: Self-pay | Admitting: Emergency Medicine

## 2014-11-17 ENCOUNTER — Inpatient Hospital Stay
Admission: EM | Admit: 2014-11-17 | Discharge: 2014-11-19 | DRG: 443 | Disposition: A | Discharge status: Home / Self Care | Payer: Medicare Other | Attending: Internal Medicine | Admitting: Internal Medicine

## 2014-11-17 DIAGNOSIS — G9340 Encephalopathy, unspecified: Secondary | ICD-10-CM | POA: Diagnosis not present

## 2014-11-17 DIAGNOSIS — F17210 Nicotine dependence, cigarettes, uncomplicated: Secondary | ICD-10-CM | POA: Diagnosis present

## 2014-11-17 DIAGNOSIS — E119 Type 2 diabetes mellitus without complications: Secondary | ICD-10-CM

## 2014-11-17 DIAGNOSIS — E876 Hypokalemia: Secondary | ICD-10-CM | POA: Diagnosis present

## 2014-11-17 DIAGNOSIS — R4182 Altered mental status, unspecified: Secondary | ICD-10-CM

## 2014-11-17 DIAGNOSIS — R109 Unspecified abdominal pain: Secondary | ICD-10-CM

## 2014-11-17 DIAGNOSIS — B1920 Unspecified viral hepatitis C without hepatic coma: Secondary | ICD-10-CM | POA: Diagnosis present

## 2014-11-17 DIAGNOSIS — Z9114 Patient's other noncompliance with medication regimen: Secondary | ICD-10-CM | POA: Diagnosis present

## 2014-11-17 DIAGNOSIS — K7682 Hepatic encephalopathy: Secondary | ICD-10-CM | POA: Diagnosis present

## 2014-11-17 DIAGNOSIS — Z79899 Other long term (current) drug therapy: Secondary | ICD-10-CM

## 2014-11-17 DIAGNOSIS — R7989 Other specified abnormal findings of blood chemistry: Secondary | ICD-10-CM

## 2014-11-17 DIAGNOSIS — K7460 Unspecified cirrhosis of liver: Secondary | ICD-10-CM | POA: Diagnosis present

## 2014-11-17 DIAGNOSIS — R1030 Lower abdominal pain, unspecified: Secondary | ICD-10-CM | POA: Diagnosis present

## 2014-11-17 DIAGNOSIS — K7200 Acute and subacute hepatic failure without coma: Principal | ICD-10-CM | POA: Diagnosis present

## 2014-11-17 DIAGNOSIS — K219 Gastro-esophageal reflux disease without esophagitis: Secondary | ICD-10-CM | POA: Diagnosis present

## 2014-11-17 DIAGNOSIS — G8929 Other chronic pain: Secondary | ICD-10-CM | POA: Diagnosis present

## 2014-11-17 DIAGNOSIS — Z880 Allergy status to penicillin: Secondary | ICD-10-CM

## 2014-11-17 DIAGNOSIS — E785 Hyperlipidemia, unspecified: Secondary | ICD-10-CM | POA: Diagnosis present

## 2014-11-17 DIAGNOSIS — I10 Essential (primary) hypertension: Secondary | ICD-10-CM

## 2014-11-17 HISTORY — DX: Unspecified cirrhosis of liver: K74.60

## 2014-11-17 HISTORY — DX: Inflammatory liver disease, unspecified: K75.9

## 2014-11-17 LAB — AMMONIA: Ammonia: 95 umol/L — ABNORMAL HIGH (ref 9–35)

## 2014-11-17 LAB — COMPREHENSIVE METABOLIC PANEL
ALT: 20 U/L (ref 14–54)
AST: 52 U/L — AB (ref 15–41)
Albumin: 3.1 g/dL — ABNORMAL LOW (ref 3.5–5.0)
Alkaline Phosphatase: 126 U/L (ref 38–126)
Anion gap: 8 (ref 5–15)
BILIRUBIN TOTAL: 1.8 mg/dL — AB (ref 0.3–1.2)
BUN: 12 mg/dL (ref 6–20)
CO2: 24 mmol/L (ref 22–32)
CREATININE: 0.88 mg/dL (ref 0.44–1.00)
Calcium: 9.1 mg/dL (ref 8.9–10.3)
Chloride: 110 mmol/L (ref 101–111)
Glucose, Bld: 170 mg/dL — ABNORMAL HIGH (ref 65–99)
POTASSIUM: 3.2 mmol/L — AB (ref 3.5–5.1)
Sodium: 142 mmol/L (ref 135–145)
TOTAL PROTEIN: 8.1 g/dL (ref 6.5–8.1)

## 2014-11-17 LAB — URINALYSIS COMPLETE WITH MICROSCOPIC (ARMC ONLY)
Bacteria, UA: NONE SEEN
Bilirubin Urine: NEGATIVE
GLUCOSE, UA: 50 mg/dL — AB
Hgb urine dipstick: NEGATIVE
KETONES UR: NEGATIVE mg/dL
LEUKOCYTES UA: NEGATIVE
NITRITE: NEGATIVE
Protein, ur: NEGATIVE mg/dL
SPECIFIC GRAVITY, URINE: 1.014 (ref 1.005–1.030)
pH: 6 (ref 5.0–8.0)

## 2014-11-17 LAB — CBC
HEMATOCRIT: 36.8 % (ref 35.0–47.0)
Hemoglobin: 12.5 g/dL (ref 12.0–16.0)
MCH: 33.2 pg (ref 26.0–34.0)
MCHC: 33.9 g/dL (ref 32.0–36.0)
MCV: 98.2 fL (ref 80.0–100.0)
PLATELETS: 123 10*3/uL — AB (ref 150–440)
RBC: 3.75 MIL/uL — AB (ref 3.80–5.20)
RDW: 15.2 % — AB (ref 11.5–14.5)
WBC: 6 10*3/uL (ref 3.6–11.0)

## 2014-11-17 MED ORDER — LACTULOSE 10 GM/15ML PO SOLN
30.0000 g | Freq: Once | ORAL | Status: AC
  Administered 2014-11-17: 30 g via ORAL
  Filled 2014-11-17: qty 60

## 2014-11-17 NOTE — ED Notes (Signed)
Pt here with niece, reports pt with altered mental status since Monday; pt reports lower abdominal pain, vomiting, diarrhea, burning with urination. Pt with no noticeable facial droop, pt alert and oriented, grip strengths equal.

## 2014-11-17 NOTE — ED Provider Notes (Signed)
[facility] Emergency Department Provider Note    ____________________________________________  Time seen: 2130  I have reviewed the triage vital signs and the nursing notes.   HISTORY  Chief Complaint Altered Mental Status   History limited by: Confusion    HPI Gloria Cole is a 69 y.o. female who presents to the emergency department today accompanied by family because of concerns for increased confusion. Per the family the patient was in her normal state of health over the weekend and was able to be active over the weekend. Starting Monday they did notice that the patient started to become more confused. The patient does report some lower abdominal pain and burning with urination. Additionally the patient is on lactulose and states she has been having one or 2 bowel movements per day. Patient denies any fevers. Denies any chest pain or shortness breath.   Past Medical History  Diagnosis Date  . Esophageal varices March 2014  . Diabetes 2006  . History of hepatitis C   . History of encephalopathy   . Hypertension   . Hyperlipidemia associated with type 2 diabetes mellitus   . GERD (gastroesophageal reflux disease)   . Cirrhosis     Patient Active Problem List   Diagnosis Date Noted  . Nausea and vomiting 07/03/2012  . Gallstones 06/09/2012  . Cirrhosis of liver without mention of alcohol 06/09/2012    Past Surgical History  Procedure Laterality Date  . Abdominal hysterectomy  1994  . Colonoscopy  2010  . Upper gi endoscopy  2014  . Ercp  April 2014    Dr Candace Cruise  . Cholecystectomy  April 2014    Dr Bary Castilla  . Esophagogastroduodenoscopy (egd) with propofol N/A 10/07/2014    Procedure: ESOPHAGOGASTRODUODENOSCOPY (EGD) WITH PROPOFOL;  Surgeon: Hulen Luster, MD;  Location: [facility] ENDOSCOPY;  Service: Gastroenterology;  Laterality: N/A;    Current Outpatient Rx  Name  Route  Sig  Dispense  Refill  . cholecalciferol (VITAMIN D) 400 UNITS  TABS   Oral   Take 400 Units by mouth daily.         Marland Kitchen estradiol (ESTRACE) 1 MG tablet   Oral   Take 1 tablet by mouth.         Marland Kitchen glimepiride (AMARYL) 2 MG tablet   Oral   Take 2 mg by mouth daily before breakfast.         . metFORMIN (GLUCOPHAGE-XR) 500 MG 24 hr tablet   Oral   Take 1,500 mg by mouth daily with breakfast.         . Multiple Vitamins-Minerals (HM COMPLETE 50+) TABS   Oral   Take 1 tablet by mouth daily.         . nadolol (CORGARD) 20 MG tablet               . ondansetron (ZOFRAN ODT) 8 MG disintegrating tablet   Oral   Take 1 tablet (8 mg total) by mouth every 8 (eight) hours as needed for nausea.   50 tablet   3   . pantoprazole (PROTONIX) 40 MG tablet   Oral   Take 40 mg by mouth 2 (two) times daily.         . potassium citrate (UROCIT-K) 10 MEQ (1080 MG) SR tablet   Oral   Take 10 mEq by mouth daily as needed.         . pravastatin (PRAVACHOL) 40 MG tablet               .  vitamin B-12 (CYANOCOBALAMIN) 500 MCG tablet   Oral   Take 500 mcg by mouth daily.           Allergies Penicillins  No family history on file.  Social History Social History  Substance Use Topics  . Smoking status: Current Some Day Smoker -- 1.00 packs/day for 30 years    Types: Cigarettes  . Smokeless tobacco: None     Comment: 1 pack in 2 weeks  . Alcohol Use: Yes     Comment: occasionally    Review of Systems  Constitutional: Negative for fever. Cardiovascular: Negative for chest pain. Respiratory: Negative for shortness of breath. Gastrointestinal: Positive for lower abdominal pain Genitourinary: Positive for dysuria Musculoskeletal: Negative for back pain. Skin: Negative for rash. Neurological: Negative for headaches, focal weakness or numbness.   10-point ROS otherwise negative.  ____________________________________________   PHYSICAL EXAM:  VITAL SIGNS: ED Triage Vitals  Enc Vitals Group     BP 11/17/14 1857 148/75  mmHg     Pulse Rate 11/17/14 1857 75     Resp --      Temp 11/17/14 1857 98.4 F (36.9 C)     Temp Source 11/17/14 1857 Oral     SpO2 11/17/14 1857 94 %     Weight 11/17/14 1857 145 lb (65.772 kg)     Height 11/17/14 1857 5\' 6"  (1.676 m)     Head Cir --      Peak Flow --      Pain Score 11/17/14 1903 8   Constitutional: Awake and alert, slightly confused, speech slightly hard to understand. Eyes: Conjunctivae are normal. PERRL. Normal extraocular movements. ENT   Head: Normocephalic and atraumatic.   Nose: No congestion/rhinnorhea.   Mouth/Throat: Mucous membranes are moist.   Neck: No stridor. Hematological/Lymphatic/Immunilogical: No cervical lymphadenopathy. Cardiovascular: Normal rate, regular rhythm.  No murmurs, rubs, or gallops. Respiratory: Normal respiratory effort without tachypnea nor retractions. Breath sounds are clear and equal bilaterally. No wheezes/rales/rhonchi. Gastrointestinal: Soft and minimally tender in the lower abdomen. Genitourinary: Deferred Musculoskeletal: Normal range of motion in all extremities. No joint effusions.  No lower extremity tenderness nor edema. Neurologic:  Slightly difficult to understand speech, some confusion. Patient is awake and alert. No focal neuro deficits. Skin:  Skin is warm, dry and intact. No rash noted.  ____________________________________________    LABS (pertinent positives/negatives)  Labs Reviewed  COMPREHENSIVE METABOLIC PANEL - Abnormal; Notable for the following:    Potassium 3.2 (*)    Glucose, Bld 170 (*)    Albumin 3.1 (*)    AST 52 (*)    Total Bilirubin 1.8 (*)    All other components within normal limits  CBC - Abnormal; Notable for the following:    RBC 3.75 (*)    RDW 15.2 (*)    Platelets 123 (*)    All other components within normal limits  AMMONIA - Abnormal; Notable for the following:    Ammonia 95 (*)    All other components within normal limits      ____________________________________________   EKG  I, Nance Pear, attending physician, personally viewed and interpreted this EKG  EKG Time: 1908 Rate: 76 Rhythm: normal sinus rhythm Axis: normal Intervals: qtc 474 QRS: narrow ST changes: no st elevation Impression: normal ekg   ____________________________________________    RADIOLOGY  None   ____________________________________________   PROCEDURES  Procedure(s) performed: None  Critical Care performed: No  ____________________________________________   INITIAL IMPRESSION / ASSESSMENT AND PLAN / ED COURSE  Pertinent labs & imaging results that were available during my care of the patient were reviewed by me and considered in my medical decision making (see chart for details).  Patient presented to the emergency department today accompanied by family because of some increased confusion. Blood work is notable for an increased ammonia level. No focal drift deficits at this point less I will hold off on emergent neuroimaging. Patient was complaining of some dysuria and lower abdominal pain however urine without any obvious infection. No leukocytosis and the serum to suggest infection. Will give lactulose and plan on admission for further management and workup.  ____________________________________________   FINAL CLINICAL IMPRESSION(S) / ED DIAGNOSES  Final diagnoses:  Altered mental status, unspecified altered mental status type  Increased ammonia level     Nance Pear, MD 11/17/14 [phone]

## 2014-11-18 ENCOUNTER — Inpatient Hospital Stay: Payer: Medicare Other

## 2014-11-18 ENCOUNTER — Encounter: Payer: Self-pay | Admitting: Internal Medicine

## 2014-11-18 DIAGNOSIS — R1030 Lower abdominal pain, unspecified: Secondary | ICD-10-CM | POA: Diagnosis present

## 2014-11-18 DIAGNOSIS — Z880 Allergy status to penicillin: Secondary | ICD-10-CM | POA: Diagnosis not present

## 2014-11-18 DIAGNOSIS — I10 Essential (primary) hypertension: Secondary | ICD-10-CM | POA: Diagnosis present

## 2014-11-18 DIAGNOSIS — K7460 Unspecified cirrhosis of liver: Secondary | ICD-10-CM | POA: Diagnosis present

## 2014-11-18 DIAGNOSIS — B1920 Unspecified viral hepatitis C without hepatic coma: Secondary | ICD-10-CM | POA: Diagnosis present

## 2014-11-18 DIAGNOSIS — E785 Hyperlipidemia, unspecified: Secondary | ICD-10-CM | POA: Diagnosis present

## 2014-11-18 DIAGNOSIS — G8929 Other chronic pain: Secondary | ICD-10-CM | POA: Diagnosis present

## 2014-11-18 DIAGNOSIS — F17210 Nicotine dependence, cigarettes, uncomplicated: Secondary | ICD-10-CM | POA: Diagnosis present

## 2014-11-18 DIAGNOSIS — E119 Type 2 diabetes mellitus without complications: Secondary | ICD-10-CM

## 2014-11-18 DIAGNOSIS — K7200 Acute and subacute hepatic failure without coma: Secondary | ICD-10-CM | POA: Diagnosis present

## 2014-11-18 DIAGNOSIS — K219 Gastro-esophageal reflux disease without esophagitis: Secondary | ICD-10-CM | POA: Diagnosis present

## 2014-11-18 DIAGNOSIS — E876 Hypokalemia: Secondary | ICD-10-CM | POA: Diagnosis present

## 2014-11-18 DIAGNOSIS — G9340 Encephalopathy, unspecified: Secondary | ICD-10-CM | POA: Diagnosis present

## 2014-11-18 DIAGNOSIS — K7682 Hepatic encephalopathy: Secondary | ICD-10-CM | POA: Diagnosis present

## 2014-11-18 DIAGNOSIS — Z79899 Other long term (current) drug therapy: Secondary | ICD-10-CM | POA: Diagnosis not present

## 2014-11-18 DIAGNOSIS — Z9114 Patient's other noncompliance with medication regimen: Secondary | ICD-10-CM | POA: Diagnosis present

## 2014-11-18 LAB — GLUCOSE, CAPILLARY
GLUCOSE-CAPILLARY: 117 mg/dL — AB (ref 65–99)
GLUCOSE-CAPILLARY: 112 mg/dL — AB (ref 65–99)
GLUCOSE-CAPILLARY: 149 mg/dL — AB (ref 65–99)
Glucose-Capillary: 99 mg/dL (ref 65–99)
Glucose-Capillary: 104 mg/dL — ABNORMAL HIGH (ref 65–99)

## 2014-11-18 LAB — MAGNESIUM: Magnesium: 1.7 mg/dL (ref 1.7–2.4)

## 2014-11-18 LAB — COMPREHENSIVE METABOLIC PANEL
ALBUMIN: 2.6 g/dL — AB (ref 3.5–5.0)
ALT: 18 U/L (ref 14–54)
AST: 41 U/L (ref 15–41)
Alkaline Phosphatase: 96 U/L (ref 38–126)
Anion gap: 8 (ref 5–15)
BUN: 11 mg/dL (ref 6–20)
CHLORIDE: 115 mmol/L — AB (ref 101–111)
CO2: 22 mmol/L (ref 22–32)
Calcium: 8.4 mg/dL — ABNORMAL LOW (ref 8.9–10.3)
Creatinine, Ser: 0.67 mg/dL (ref 0.44–1.00)
GFR calc Af Amer: >60 mL/min (ref >60)
GFR calc non Af Amer: >60 mL/min (ref >60)
GLUCOSE: 105 mg/dL — AB (ref 65–99)
POTASSIUM: 3 mmol/L — AB (ref 3.5–5.1)
Sodium: 145 mmol/L (ref 135–145)
Total Bilirubin: 1.9 mg/dL — ABNORMAL HIGH (ref 0.3–1.2)
Total Protein: 7 g/dL (ref 6.5–8.1)

## 2014-11-18 LAB — AMMONIA: Ammonia: 63 umol/L — ABNORMAL HIGH (ref 9–35)

## 2014-11-18 LAB — POTASSIUM: POTASSIUM: 4.2 mmol/L (ref 3.5–5.1)

## 2014-11-18 MED ORDER — POTASSIUM CHLORIDE 20 MEQ PO PACK
40.0000 meq | PACK | ORAL | Status: AC
  Administered 2014-11-18 (×2): 40 meq via ORAL
  Filled 2014-11-18 (×2): qty 2

## 2014-11-18 MED ORDER — ASPIRIN EC 81 MG PO TBEC
81.0000 mg | DELAYED_RELEASE_TABLET | Freq: Every day | ORAL | Status: DC
  Administered 2014-11-18 – 2014-11-19 (×2): 81 mg via ORAL
  Filled 2014-11-18 (×2): qty 1

## 2014-11-18 MED ORDER — LACTULOSE 10 GM/15ML PO SOLN
30.0000 g | Freq: Four times a day (QID) | ORAL | Status: DC
  Administered 2014-11-18 – 2014-11-19 (×5): 30 g via ORAL
  Filled 2014-11-18 (×5): qty 60

## 2014-11-18 MED ORDER — RIFAXIMIN 550 MG PO TABS
550.0000 mg | ORAL_TABLET | Freq: Two times a day (BID) | ORAL | Status: DC
  Administered 2014-11-18 – 2014-11-19 (×3): 550 mg via ORAL
  Filled 2014-11-18 (×3): qty 1

## 2014-11-18 MED ORDER — SODIUM CHLORIDE 0.9 % IV SOLN
INTRAVENOUS | Status: DC
  Administered 2014-11-18: 02:00:00 via INTRAVENOUS

## 2014-11-18 MED ORDER — POTASSIUM CITRATE ER 10 MEQ (1080 MG) PO TBCR
10.0000 meq | EXTENDED_RELEASE_TABLET | Freq: Every day | ORAL | Status: DC
  Administered 2014-11-18 – 2014-11-19 (×2): 10 meq via ORAL
  Filled 2014-11-18 (×4): qty 1

## 2014-11-18 MED ORDER — PANTOPRAZOLE SODIUM 40 MG PO TBEC
40.0000 mg | DELAYED_RELEASE_TABLET | Freq: Two times a day (BID) | ORAL | Status: DC
  Administered 2014-11-18 – 2014-11-19 (×3): 40 mg via ORAL
  Filled 2014-11-18 (×3): qty 1

## 2014-11-18 MED ORDER — POTASSIUM CHLORIDE 10 MEQ/100ML IV SOLN
10.0000 meq | INTRAVENOUS | Status: DC
  Administered 2014-11-18: 10 meq via INTRAVENOUS
  Filled 2014-11-18 (×4): qty 100

## 2014-11-18 MED ORDER — ENOXAPARIN SODIUM 40 MG/0.4ML ~~LOC~~ SOLN
40.0000 mg | Freq: Every day | SUBCUTANEOUS | Status: DC
  Administered 2014-11-18: 40 mg via SUBCUTANEOUS
  Filled 2014-11-18: qty 0.4

## 2014-11-18 MED ORDER — ONDANSETRON HCL 4 MG PO TABS: 4.0000 mg | ORAL_TABLET | Freq: Four times a day (QID) | ORAL | Status: DC | PRN

## 2014-11-18 MED ORDER — PRAVASTATIN SODIUM 40 MG PO TABS
40.0000 mg | ORAL_TABLET | Freq: Every day | ORAL | Status: DC
  Administered 2014-11-18 – 2014-11-19 (×2): 40 mg via ORAL
  Filled 2014-11-18 (×3): qty 1

## 2014-11-18 MED ORDER — SODIUM CHLORIDE 0.9 % IJ SOLN
3.0000 mL | Freq: Two times a day (BID) | INTRAMUSCULAR | Status: DC
  Administered 2014-11-18 – 2014-11-19 (×2): 3 mL via INTRAVENOUS

## 2014-11-18 MED ORDER — ADULT MULTIVITAMIN W/MINERALS CH
1.0000 | ORAL_TABLET | Freq: Every day | ORAL | Status: DC
  Administered 2014-11-18 – 2014-11-19 (×2): 1 via ORAL
  Filled 2014-11-18 (×2): qty 1

## 2014-11-18 MED ORDER — ONDANSETRON 8 MG PO TBDP
8.0000 mg | ORAL_TABLET | Freq: Three times a day (TID) | ORAL | Status: DC | PRN
  Filled 2014-11-18: qty 1

## 2014-11-18 MED ORDER — ONDANSETRON HCL 4 MG/2ML IJ SOLN: 4.0000 mg | Freq: Four times a day (QID) | INTRAMUSCULAR | Status: DC | PRN

## 2014-11-18 MED ORDER — CYANOCOBALAMIN 500 MCG PO TABS
500.0000 ug | ORAL_TABLET | Freq: Every day | ORAL | Status: DC
  Administered 2014-11-18 – 2014-11-19 (×2): 500 ug via ORAL
  Filled 2014-11-18 (×2): qty 1

## 2014-11-18 MED ORDER — RIFAXIMIN 550 MG PO TABS: 550.0000 mg | ORAL_TABLET | Freq: Three times a day (TID) | ORAL | Status: DC

## 2014-11-18 MED ORDER — INSULIN ASPART 100 UNIT/ML ~~LOC~~ SOLN
0.0000 [IU] | SUBCUTANEOUS | Status: DC
  Administered 2014-11-18: 1 [IU] via SUBCUTANEOUS
  Administered 2014-11-19: 2 [IU] via SUBCUTANEOUS
  Filled 2014-11-18: qty 2
  Filled 2014-11-18: qty 1

## 2014-11-18 MED ORDER — NADOLOL 20 MG PO TABS
20.0000 mg | ORAL_TABLET | Freq: Every day | ORAL | Status: DC
  Administered 2014-11-18 – 2014-11-19 (×2): 20 mg via ORAL
  Filled 2014-11-18 (×2): qty 1

## 2014-11-18 MED ORDER — POTASSIUM CHLORIDE CRYS ER 20 MEQ PO TBCR: 40.0000 meq | EXTENDED_RELEASE_TABLET | ORAL | Status: DC

## 2014-11-18 MED ORDER — POTASSIUM CHLORIDE IN NACL 20-0.45 MEQ/L-% IV SOLN
INTRAVENOUS | Status: DC
  Administered 2014-11-18: 14:00:00 via INTRAVENOUS
  Filled 2014-11-18 (×3): qty 1000

## 2014-11-18 MED ORDER — CHOLECALCIFEROL 10 MCG (400 UNIT) PO TABS
400.0000 [IU] | ORAL_TABLET | Freq: Every day | ORAL | Status: DC
  Administered 2014-11-18 – 2014-11-19 (×2): 400 [IU] via ORAL
  Filled 2014-11-18 (×2): qty 1

## 2014-11-18 NOTE — ED Notes (Signed)
Reddy MD at bedside.

## 2014-11-18 NOTE — Progress Notes (Signed)
Notified Dr sudini of patient complaining of IV site pain with potassium infusion, RN changed IV site, another IV started by unit manager, restarted infusion but still complaining of site pain.  MD states "she looked okay to take ok, im gonna put something else in, you can stop potassium IVBP"

## 2014-11-18 NOTE — Progress Notes (Signed)
Patient with up to bathroom often for bm, continues on lactulose for high ammonia level, potassium replaced for 3.2, last potassium 4.2.  Patient with 1/2NS with 50meq of potassium continuous at 36ml/hr.  Currently in no distress, family at bedside.

## 2014-11-18 NOTE — Consult Note (Signed)
Pt with Hep C and cirrhosis, non compliant with lactulose and became lethargic and encephalopathic.  She may need assisted living if not able to care for her self by taking her meds.  Pt more alert and oriented than on admission.  No further action needed on our part.  It would be ideal if family members could become involved in being sure she takes her meds by rotating visits.  Regular home health visits may accomplish the same thing. No further GI imput needed in this case and I will sign off.

## 2014-11-18 NOTE — Progress Notes (Signed)
Per telemetry clerk

## 2014-11-18 NOTE — Progress Notes (Signed)
[place] [facility] [place] NAME: Gloria Cole    MR#:  [id number]  DATE OF BIRTH:  02-Jun-1945  SUBJECTIVE:  CHIEF COMPLAINT:   Chief Complaint  Patient presents with  . Altered Mental Status   Admitted for confusion. More awake today. But still confused. Has chronic abdominal pain. REVIEW OF SYSTEMS:    Review of Systems  Unable to perform ROS: mental status change      DRUG ALLERGIES:   Allergies  Allergen Reactions  . Penicillins Swelling and Other (See Comments)    Patient is unable to answer questions about her allergy    VITALS:  Blood pressure 91/36, pulse 73, temperature 98.1 F (36.7 C), temperature source Oral, resp. rate 14, height 5\' 6"  (1.676 m), weight 63.866 kg (140 lb 12.8 oz), SpO2 100 %.  PHYSICAL EXAMINATION:   Physical Exam  GENERAL:  69 y.o.-year-old patient lying in the bed with no acute distress.  EYES: Pupils equal, round, reactive to light and accommodation. No scleral icterus. Extraocular muscles intact.  HEENT: Head atraumatic, normocephalic. Oropharynx and nasopharynx clear.  NECK:  Supple, no jugular venous distention. No thyroid enlargement, no tenderness.  LUNGS: Normal breath sounds bilaterally, no wheezing, rales, rhonchi. No use of accessory muscles of respiration.  CARDIOVASCULAR: S1, S2 normal. No murmurs, rubs, or gallops.  ABDOMEN: Soft, nontender, nondistended. Bowel sounds present. No organomegaly or mass.  EXTREMITIES: No cyanosis, clubbing or edema b/l.    NEUROLOGIC: Cranial nerves II through XII are intact. No focal Motor or sensory deficits b/l.   PSYCHIATRIC: The patient is drowsy. Confused SKIN: No obvious rash, lesion, or ulcer.    LABORATORY PANEL:   CBC  Recent Labs Lab 11/17/14 1908  WBC 6.0  HGB 12.5  HCT 36.8  PLT 123*   ------------------------------------------------------------------------------------------------------------------  Chemistries    Recent Labs Lab 11/18/14 0557  NA 145  K 3.0*  CL 115*  CO2 22  GLUCOSE 105*  BUN 11  CREATININE 0.67  CALCIUM 8.4*  AST 41  ALT 18  ALKPHOS 96  BILITOT 1.9*   ------------------------------------------------------------------------------------------------------------------  Cardiac Enzymes No results for input(s): TROPONINI in the last 168 hours. ------------------------------------------------------------------------------------------------------------------  RADIOLOGY:  US Abdomen Complete  11/18/2014   CLINICAL DATA:  Abdominal pain for 3 days.  EXAM: ULTRASOUND ABDOMEN COMPLETE  COMPARISON:  Ultrasound 07/14/2013 CT 01/19/2013  FINDINGS: Gallbladder: Surgically absent.  Common bile duct: Diameter: 6.5 mm  Liver: Nodular hepatic contours. No focal lesion identified. Mildly coarsened in parenchymal echogenicity. Normal directional flow in the main portal vein.  IVC: No abnormality visualized.  Pancreas: Visualized portion unremarkable.  Spleen: Size and appearance within normal limits.  Right Kidney: Length: 11.6 cm. Echogenicity within normal limits. No mass or hydronephrosis visualized.  Left Kidney: Length: 10.8 cm. Echogenicity within normal limits. No mass or hydronephrosis visualized.  Abdominal aorta: No aneurysm visualized.  2.1 cm maximal dimension.  Other findings: None.  No ascites.  IMPRESSION: 1. Nodular hepatic contours consistent with cirrhosis. No evidence of focal hepatic lesion. 2. Postcholecystectomy. Biliary prominence is likely secondary 2 postcholecystectomy state, the common bile duct has diminished in caliber from prior exam.   Electronically Signed   By: Jeb Levering M.D.   On: 11/18/2014 03:34     ASSESSMENT AND PLAN:   69 year old female with history of hepatitis C, cirrhosis, esophageal varices, history of hepatic encephalopathy, diabetes mellitus type 2, hypertension was brought in by the family members to the emergency room with the  complaints of  increasing confusion and disorientation for the past 2-3 days.  * Acute encephalopathy secondary to hepatic encephalopathy Patient started on lactulose. Continue rifaximin. Goal 3-4 stools per day. Ammonia levels have decreased but patient is still confused. Will continue to need inpatient care. Possible discharge in 1-2 days depending on her mental status and ammonia levels. Elevated LFTs due to cirrhosis.  * Abdominal pain, chronic  * Hypokalemia. Patient did not tolerate IV replacement. Added oral replacement.  * Diabetes mellitus type 2. Oral medications held. Sliding scale insulin.  * Hypertension, stable.  * DVT prophylaxis with Lovenox  All the records are reviewed and case discussed with Care Management/Social Workerr. Management plans discussed with the patient, family and they are in agreement.  CODE STATUS: Full code  DVT Prophylaxis: SCDs and Lovenox  TOTAL TIME TAKING CARE OF THIS PATIENT: 35 minutes.   POSSIBLE D/C IN 1-2 DAYS, DEPENDING ON CLINICAL CONDITION.   Hillary Bow R M.D on 11/18/2014 at 2:08 PM  Between 7am to 6pm - Pager - [phone]  After 6pm go to [email or web address] - password [email or web address] [email or web address]  Office  [phone]  CC: Primary care physician; Kirk Ruths., MD    Note: This dictation was prepared with Dragon dictation along with smaller phrase technology. Any transcriptional errors that result from this process are unintentional.

## 2014-11-18 NOTE — Progress Notes (Signed)
Notified Dr Darvin Neighbours that patient potassium from am lab was 3, patient only have potassium 23mEq ordered in [place]. MD will put orders in.

## 2014-11-18 NOTE — Consult Note (Signed)
Pt sees Dr. Candace Cruise and should be scheduled to see him after she is discharged.

## 2014-11-18 NOTE — H&P (Signed)
[facility] NAME: Gloria Cole    MR#:  [id number]  DATE OF BIRTH:  [birth year]  DATE OF ADMISSION:  11/17/2014  PRIMARY CARE PHYSICIAN: Kirk Ruths., MD   REQUESTING/REFERRING PHYSICIAN: Dr. Archie Balboa  CHIEF COMPLAINT:   Chief Complaint  Patient presents with  . Altered Mental Status    HISTORY OF PRESENT ILLNESS:  Gloria Cole  is a 69 y.o. female with a known history of hepatitis C, cirrhosis, esophageal varices, history of hepatic encephalopathy, diabetes mellitus type 2, hypertension, hyperlipidemia, GERD was brought in by the family with the complaints of increased confusion with disorientation since past 3 days. No history of fever, cough, chest pain, shortness of breath, nausea, vomiting, diarrhea. She does complain of vague abdominal pain. Per patient's niece who is with her at this time, patient is compliant with with her medications but not sure. Patient was evaluated by the ED physician and found to be with stable vital signs, lethargic/drowsy but arousable. Workup revealed normal CBC, UA unremarkable, elevated serum ammonia level of 95, total bili 1.8, AST/ALT 52/20, potassium 3.2. EKG normal sinus rhythm with ventricular rate of 76 bpm. Patient was given by mouth lactulose and hospitalist service was consulted for further management. At the current time patient is comfortably resting in the bed and not in any distress, lethargic and drowsy but arousable and alert and awake and oriented 3, moving all 4 limbs well.  PAST MEDICAL HISTORY:   Past Medical History  Diagnosis Date  . Esophageal varices March 2014  . Diabetes 2006  . History of hepatitis C   . History of encephalopathy   . Hypertension   . Hyperlipidemia associated with type 2 diabetes mellitus   . GERD (gastroesophageal reflux disease)   . Cirrhosis   . Hepatitis     PAST SURGICAL HISTORY:   Past Surgical History  Procedure  Laterality Date  . Abdominal hysterectomy  1994  . Colonoscopy  2010  . Upper gi endoscopy  2014  . Ercp  April 2014    Dr Candace Cruise  . Cholecystectomy  April 2014    Dr Bary Castilla  . Esophagogastroduodenoscopy (egd) with propofol N/A 10/07/2014    Procedure: ESOPHAGOGASTRODUODENOSCOPY (EGD) WITH PROPOFOL;  Surgeon: Hulen Luster, MD;  Location: [facility] ENDOSCOPY;  Service: Gastroenterology;  Laterality: N/A;    SOCIAL HISTORY:   Social History  Substance Use Topics  . Smoking status: Current Some Day Smoker -- 1.00 packs/day for 30 years    Types: Cigarettes  . Smokeless tobacco: Not on file     Comment: 1 pack in 2 weeks  . Alcohol Use: Yes     Comment: occasionally    FAMILY HISTORY:   Family History  Problem Relation Age of Onset  . Diabetes Sister   . Diabetes Brother     DRUG ALLERGIES:   Allergies  Allergen Reactions  . Penicillins Swelling and Other (See Comments)    Patient is unable to answer questions about her allergy    REVIEW OF SYSTEMS:   Review of Systems  Constitutional: Positive for malaise/fatigue. Negative for fever and chills.  HENT: Negative for ear pain, hearing loss, nosebleeds, sore throat and tinnitus.   Eyes: Negative for blurred vision, double vision, pain, discharge and redness.  Respiratory: Negative for cough, hemoptysis, sputum production, shortness of breath and wheezing.   Cardiovascular: Negative for chest pain, palpitations, orthopnea and leg swelling.  Gastrointestinal: Positive for abdominal pain. Negative for nausea,  vomiting, diarrhea, constipation, blood in stool and melena.  Genitourinary: Negative for dysuria, urgency, frequency and hematuria.  Musculoskeletal: Negative for back pain, joint pain and neck pain.  Skin: Negative for itching and rash.  Neurological: Negative for dizziness, tingling, sensory change, focal weakness and seizures.       Disorientation as mentioned in history of present illness  Endo/Heme/Allergies: Does not  bruise/bleed easily.  Psychiatric/Behavioral: Negative for depression. The patient is not nervous/anxious.     MEDICATIONS AT HOME:   Prior to Admission medications   Medication Sig Start Date End Date Taking? Authorizing Provider  cholecalciferol (VITAMIN D) 400 UNITS TABS Take 400 Units by mouth daily.    Historical Provider, MD  estradiol (ESTRACE) 1 MG tablet Take 1 tablet by mouth. 04/16/12   Historical Provider, MD  glimepiride (AMARYL) 2 MG tablet Take 2 mg by mouth daily before breakfast.    Historical Provider, MD  metFORMIN (GLUCOPHAGE-XR) 500 MG 24 hr tablet Take 1,500 mg by mouth daily with breakfast.    Historical Provider, MD  Multiple Vitamins-Minerals (HM COMPLETE 50+) TABS Take 1 tablet by mouth daily.    Historical Provider, MD  nadolol (CORGARD) 20 MG tablet  06/17/12   Historical Provider, MD  ondansetron (ZOFRAN ODT) 8 MG disintegrating tablet Take 1 tablet (8 mg total) by mouth every 8 (eight) hours as needed for nausea. 07/03/12   Robert Bellow, MD  pantoprazole (PROTONIX) 40 MG tablet Take 40 mg by mouth 2 (two) times daily.    Historical Provider, MD  potassium citrate (UROCIT-K) 10 MEQ (1080 MG) SR tablet Take 10 mEq by mouth daily as needed.    Historical Provider, MD  pravastatin (PRAVACHOL) 40 MG tablet  06/20/12   Historical Provider, MD  vitamin B-12 (CYANOCOBALAMIN) 500 MCG tablet Take 500 mcg by mouth daily.    Historical Provider, MD      VITAL SIGNS:  Blood pressure 119/53, pulse 78, temperature 98.4 F (36.9 C), temperature source Oral, resp. rate 19, height 5\' 6"  (1.676 m), weight 65.772 kg (145 lb), SpO2 96 %.  PHYSICAL EXAMINATION:  Physical Exam  Constitutional: She is oriented to person, place, and time. She appears well-developed and well-nourished. No distress.  She is drowsy/lethargic but easily arousable, answering questions and following commands appropriately.  HENT:  Head: Normocephalic and atraumatic.  Right Ear: External ear normal.   Left Ear: External ear normal.  Nose: Nose normal.  Mouth/Throat: Oropharynx is clear and moist. No oropharyngeal exudate.  Eyes: EOM are normal. Pupils are equal, round, and reactive to light. No scleral icterus.  Neck: Normal range of motion. Neck supple. No JVD present. No thyromegaly present.  Cardiovascular: Normal rate, regular rhythm, normal heart sounds and intact distal pulses.  Exam reveals no friction rub.   No murmur heard. Respiratory: Effort normal and breath sounds normal. No respiratory distress. She has no wheezes. She has no rales. She exhibits no tenderness.  GI: Soft. Bowel sounds are normal. She exhibits no distension and no mass. There is no tenderness. There is no rebound and no guarding.  Musculoskeletal: Normal range of motion. She exhibits no edema.  Lymphadenopathy:    She has no cervical adenopathy.  Neurological: She is alert and oriented to person, place, and time. She has normal reflexes. She displays normal reflexes. No cranial nerve deficit. She exhibits normal muscle tone.  Skin: Skin is warm. No rash noted. No erythema.  Psychiatric: She has a normal mood and affect. Her behavior is normal.  Thought content normal.   LABORATORY PANEL:   CBC  Recent Labs Lab 11/17/14 1908  WBC 6.0  HGB 12.5  HCT 36.8  PLT 123*   ------------------------------------------------------------------------------------------------------------------  Chemistries   Recent Labs Lab 11/17/14 1908  NA 142  K 3.2*  CL 110  CO2 24  GLUCOSE 170*  BUN 12  CREATININE 0.88  CALCIUM 9.1  AST 52*  ALT 20  ALKPHOS 126  BILITOT 1.8*   ------------------------------------------------------------------------------------------------------------------  Cardiac Enzymes No results for input(s): TROPONINI in the last 168 hours. ------------------------------------------------------------------------------------------------------------------  RADIOLOGY:  No results  found.  EKG:   Orders placed or performed during the hospital encounter of 11/17/14  . EKG 12-Lead  . EKG 12-Lead  . ED EKG  . ED EKG  Normal sinus rhythm with ventricular rate of 76 bpm.  IMPRESSION AND PLAN:   69 year old female with history of hepatitis C, cirrhosis, esophageal varices, history of hepatic encephalopathy, diabetes mellitus type 2, hypertension was brought in by the family members to the emergency room with the complaints of increasing confusion and disorientation for the past 2-3 days. 1. Acute encephalopathy secondary to hepatic encephalopathy. ? Compliance to medical management. Elevated serum ammonia of 95. Rest of labs unremarkable. 2. History of hep C with cirrhosis, esophageal varices. LFTs mild elevation, patient stable clinically. Plan: Admit, monitor neuro status, continue by mouth lactulose, nothing by mouth except meds, IV fluids, continue home medications FOR cirrhosis of liver. Will request abdominal sonogram because of complaining of abdominal pain. Follow-up CMP, serum ammonia levels. GI consultation requested. 3. Hypokalemia, mild. Replace potassium, follow-up CMP. 4. Diabetes mellitus type 2. Hold home medication for now, sliding scale insulin, follow up blood sugars closely. 5. Hypertension, stable. 6. GERD, stable on PPI. Continue same. DVT prophylaxis: Subcutaneous Lovenox   All the records are reviewed and case discussed with ED provider. Management plans discussed with the patient, family and they are in agreement.  CODE STATUS: Full code  TOTAL TIME TAKING CARE OF THIS PATIENT: 50 minutes.    Azucena Freed N M.D on 11/18/2014 at 12:45 AM  Between 7am to 6pm - Pager - [phone]  After 6pm go to [email or web address] - password [email or web address] [facility]  Office  [phone]  CC: Primary care physician; Kirk Ruths., MD

## 2014-11-18 NOTE — Consult Note (Signed)
GI Inpatient Consult Note  Reason for Consult:  Hepatic Encephalopathy   Attending Requesting Consult: Dr. Reece Levy  History of Present Illness: Gloria Cole is a 69 y.o. female with a history of cirrhosis, hep c, diabetes type 2, hypertension, hyperlipidemia is being treated for hepatic encephalopathy.  She was recently seen in our office on November 15, 2014 for follow-up of her cirrhosis.  Previous to this office visit she was last seen in July, 2016 in between there she did have an upper endoscopy.  During the office visit she was encouraged to take all of her medications.  She did admit during a previous office visit in July, 2016 that she did not like to take lactulose and that she had weaned herself off of it for a couple of months, but she had been taking Xifaxin twice daily.   her ammonia level was checked after her office visit and was 147 three days ago.  Two months ago was 149, 1 year ago was 144. Her ammonia on admission was 95, is currently 63.  Her brother was present during my exam and contribute to the history.  Reports that patient's confusion started Monday.  Patient reports she had been using the lactulose and having 1-2 bowel movements a day at home.  She denies any abdominal swelling.    Her upper endoscopy was completed on September 27, 2014. Dr. Candace Cruise.  Impression ; grade 3 esophageal varices.  Completely eradicated.  Banded.  Examination was otherwise normal.  Erythematous mucosa in the antrum.  Biopsy.  Portal hypertensive gastroscopy.  The examination was otherwise normal.  Normal examined duodenum.  She is due for a repeat upper endoscopy in October, 2016  Past Medical History:  Past Medical History  Diagnosis Date  . Esophageal varices March 2014  . Diabetes 2006  . History of hepatitis C   . History of encephalopathy   . Hypertension   . Hyperlipidemia associated with type 2 diabetes mellitus   . GERD (gastroesophageal reflux disease)   . Cirrhosis   .  Hepatitis     Problem List: Patient Active Problem List   Diagnosis Date Noted  . Acute hepatic encephalopathy 11/18/2014  . DM (diabetes mellitus) 11/18/2014  . HTN (hypertension) 11/18/2014  . Nausea and vomiting 07/03/2012  . Gallstones 06/09/2012  . Hepatic cirrhosis 06/09/2012    Past Surgical History: Past Surgical History  Procedure Laterality Date  . Abdominal hysterectomy  1994  . Colonoscopy  2010  . Upper gi endoscopy  2014  . Ercp  April 2014    Dr Candace Cruise  . Cholecystectomy  April 2014    Dr Bary Castilla  . Esophagogastroduodenoscopy (egd) with propofol N/A 10/07/2014    Procedure: ESOPHAGOGASTRODUODENOSCOPY (EGD) WITH PROPOFOL;  Surgeon: Hulen Luster, MD;  Location: [facility] ENDOSCOPY;  Service: Gastroenterology;  Laterality: N/A;    Allergies: Allergies  Allergen Reactions  . Penicillins Swelling and Other (See Comments)    Patient is unable to answer questions about her allergy    Home Medications: Prescriptions prior to admission  Medication Sig Dispense Refill Last Dose  . metFORMIN (GLUCOPHAGE-XR) 500 MG 24 hr tablet Take 1,500 mg by mouth daily with breakfast.   unknown at unknown  . nadolol (CORGARD) 20 MG tablet Take 20 mg by mouth daily.    unknown at unknown  . pantoprazole (PROTONIX) 40 MG tablet Take 40 mg by mouth daily.    unknown at unknown  . rifaximin (XIFAXAN) 550 MG TABS tablet Take 550 mg  by mouth 2 (two) times daily.   unknown at unknown  . spironolactone (ALDACTONE) 25 MG tablet Take 25 mg by mouth daily.   unknown at unknown  . ondansetron (ZOFRAN ODT) 8 MG disintegrating tablet Take 1 tablet (8 mg total) by mouth every 8 (eight) hours as needed for nausea. (Patient not taking: Reported on 11/18/2014) 50 tablet 3 Not Taking at Unknown time   Home medication reconciliation was completed with the patient.   Scheduled Inpatient Medications:   . aspirin EC  81 mg Oral Daily  . cholecalciferol  400 Units Oral Daily  . cyanocobalamin  500 mcg Oral Daily  .  enoxaparin (LOVENOX) injection  40 mg Subcutaneous QHS  . insulin aspart  0-9 Units Subcutaneous 6 times per day  . lactulose  30 g Oral QID  . multivitamin with minerals  1 tablet Oral Daily  . nadolol  20 mg Oral Daily  . pantoprazole  40 mg Oral BID  . potassium chloride  40 mEq Oral Q4H  . potassium citrate  10 mEq Oral Daily  . pravastatin  40 mg Oral Daily  . rifaximin  550 mg Oral BID  . sodium chloride  3 mL Intravenous Q12H    Continuous Inpatient Infusions:   . 0.45 % NaCl with KCl 20 mEq / L 50 mL/hr at 11/18/14 1356    PRN Inpatient Medications:  ondansetron **OR** ondansetron (ZOFRAN) IV, ondansetron  Family History: family history includes Diabetes in her brother and sister.  The patient's family history is negative for inflammatory bowel disorders, GI malignancy, or solid organ transplantation.  Social History:   reports that she has been smoking Cigarettes.  She has a 30 pack-year smoking history. She does not have any smokeless tobacco history on file. She reports that she drinks alcohol. She reports that she does not use illicit drugs. The patient denies ETOH, tobacco, or drug use.   Review of Systems: Constitutional: Weight is stable.  Eyes: No changes in vision. ENT: No oral lesions, sore throat.  GI: see HPI.  Heme/Lymph: No easy bruising.  CV: No chest pain.  GU: No hematuria.  Integumentary: No rashes.  Neuro: No headaches.  Psych: No depression/anxiety.  Endocrine: No heat/cold intolerance.  Allergic/Immunologic: No urticaria.  Resp: No cough, SOB.  Musculoskeletal: No joint swelling.    Physical Examination: BP 142/70 mmHg  Pulse 28  Temp(Src) 98.3 F (36.8 C) (Oral)  Resp 18  Ht 5\' 6"  (1.676 m)  Wt 63.866 kg (140 lb 12.8 oz)  BMI 22.74 kg/m2  SpO2 82% Gen: NAD, alert and oriented x 4. Appropriate responses to questions. HEENT: PEERLA, EOMI, slight scleral icterus noted Neck: supple, no JVD or thyromegaly Chest: CTA bilaterally, no  wheezes, crackles, or other adventitious sounds CV: RRR, no m/g/c/r Abd: soft, NT, ND, +BS in all four quadrants; no HSM, guarding, ridigity, or rebound tenderness Ext: no edema, well perfused with 2+ pulses, Skin: no rash or lesions noted Lymph: no LAD  Data: Lab Results  Component Value Date   WBC 6.0 11/17/2014   HGB 12.5 11/17/2014   HCT 36.8 11/17/2014   MCV 98.2 11/17/2014   PLT 123* 11/17/2014    Recent Labs Lab 11/17/14 1908  HGB 12.5   Lab Results  Component Value Date   NA 145 11/18/2014   K 3.0* 11/18/2014   CL 115* 11/18/2014   CO2 22 11/18/2014   BUN 11 11/18/2014   CREATININE 0.67 11/18/2014   Lab Results  Component Value  Date   ALT 18 11/18/2014   AST 41 11/18/2014   ALKPHOS 96 11/18/2014   BILITOT 1.9* 11/18/2014   No results for input(s): APTT, INR, PTT in the last 168 hours. Assessment/Plan: Ms. Mcadoo is a 69 y.o. female with history of cirrhosis, hep C , hepatic encephalopathy, recently seen in our office, possibly non-compliant to medications.  Ammonia level was 147 when tested in our office 3 days ago, 95 on admission and currently 63.  Patient has had several bowel movements since admission.  Recommendations: We agree with continue with the lactulose and Xifaxin.   We recommend checking magnesium in the morning.  We agree with continuing to treat potassium levels.  Patient needs to follow up in our office after discharge.  Thank you for the consult. Please call with questions or concerns.  Salvadore Farber, PA-C  I personally performed these services.

## 2014-11-19 LAB — GLUCOSE, CAPILLARY
GLUCOSE-CAPILLARY: 107 mg/dL — AB (ref 65–99)
GLUCOSE-CAPILLARY: 113 mg/dL — AB (ref 65–99)
GLUCOSE-CAPILLARY: 124 mg/dL — AB (ref 65–99)
Glucose-Capillary: 181 mg/dL — ABNORMAL HIGH (ref 65–99)

## 2014-11-19 LAB — COMPREHENSIVE METABOLIC PANEL
ALBUMIN: 2.8 g/dL — AB (ref 3.5–5.0)
ALT: 19 U/L (ref 14–54)
ANION GAP: 5 (ref 5–15)
AST: 45 U/L — ABNORMAL HIGH (ref 15–41)
Alkaline Phosphatase: 103 U/L (ref 38–126)
BUN: 9 mg/dL (ref 6–20)
CHLORIDE: 116 mmol/L — AB (ref 101–111)
CO2: 19 mmol/L — ABNORMAL LOW (ref 22–32)
Calcium: 8.9 mg/dL (ref 8.9–10.3)
Creatinine, Ser: 0.69 mg/dL (ref 0.44–1.00)
Glucose, Bld: 110 mg/dL — ABNORMAL HIGH (ref 65–99)
POTASSIUM: 4.1 mmol/L (ref 3.5–5.1)
SODIUM: 140 mmol/L (ref 135–145)
TOTAL PROTEIN: 7.1 g/dL (ref 6.5–8.1)
Total Bilirubin: 2.1 mg/dL — ABNORMAL HIGH (ref 0.3–1.2)

## 2014-11-19 LAB — AMMONIA: AMMONIA: 47 umol/L — AB (ref 9–35)

## 2014-11-19 MED ORDER — LACTULOSE 10 GM/15ML PO SOLN: 30.0000 g | Freq: Three times a day (TID) | ORAL | Status: AC

## 2014-11-19 MED ORDER — POTASSIUM CITRATE ER 10 MEQ (1080 MG) PO TBCR: 20.0000 meq | EXTENDED_RELEASE_TABLET | Freq: Every day | ORAL | Status: DC

## 2014-11-19 NOTE — Discharge Planning (Signed)
Pt IV X2  and tele removed. Pt given DC papers, explained and educated.  Told of sugegsted FU appts and given scripts. Pt VSS and RN assessment revealed stability for DC to home.  When ride arrives, pt will be wheeled to front and traken home via car, by family.

## 2014-11-19 NOTE — Discharge Summary (Signed)
[facility] [place] NAME: Gloria Cole    MR#:  [id number]  DATE OF BIRTH:  1945-11-20  DATE OF ADMISSION:  11/17/2014 ADMITTING PHYSICIAN: Juluis Mire, MD  DATE OF DISCHARGE: 11/19/2014  2:03 PM  PRIMARY CARE PHYSICIAN: Kirk Ruths., MD    ADMISSION DIAGNOSIS:  Increased ammonia level [R79.89] Abdominal pain [R10.9] Altered mental status, unspecified altered mental status type [R41.82]  DISCHARGE DIAGNOSIS:  Principal Problem:   Acute hepatic encephalopathy Active Problems:   Hepatic cirrhosis   DM (diabetes mellitus)   HTN (hypertension)   SECONDARY DIAGNOSIS:   Past Medical History  Diagnosis Date  . Esophageal varices March 2014  . Diabetes 2006  . History of hepatitis C   . History of encephalopathy   . Hypertension   . Hyperlipidemia associated with type 2 diabetes mellitus   . GERD (gastroesophageal reflux disease)   . Cirrhosis   . Hepatitis      ADMITTING HISTORY Gloria Cole is a 69 y.o. female with a known history of hepatitis C, cirrhosis, esophageal varices, history of hepatic encephalopathy, diabetes mellitus type 2, hypertension, hyperlipidemia, GERD was brought in by the family with the complaints of increased confusion with disorientation since past 3 days. No history of fever, cough, chest pain, shortness of breath, nausea, vomiting, diarrhea. She does complain of vague abdominal pain. Per patient's niece who is with her at this time, patient is compliant with with her medications but not sure. Patient was evaluated by the ED physician and found to be with stable vital signs, lethargic/drowsy but arousable. Workup revealed normal CBC, UA unremarkable, elevated serum ammonia level of 95, total bili 1.8, AST/ALT 52/20, potassium 3.2. EKG normal sinus rhythm with ventricular rate of 76 bpm. Patient was given by mouth lactulose and hospitalist service was consulted for further management.  At the current time patient is comfortably resting in the bed and not in any distress, lethargic and drowsy but arousable and alert and awake and oriented 3, moving all 4 limbs well.    HOSPITAL COURSE:   69 year old female with history of hepatitis C, cirrhosis, esophageal varices, history of hepatic encephalopathy, diabetes mellitus type 2, hypertension was brought in by the family members to the emergency room with the complaints of increasing confusion and disorientation for the past 2-3 days.  * Acute encephalopathy secondary to hepatic encephalopathy  patient was on rifaximin at home. But not taking her lactulose.  Started on lactulose with multiple bowel movements and decrease in ammonia. By the day of discharge patient is back to normal and is alert and oriented 3. Counseled to be compliant with her medications. Prescription for lactulose given.  * Abdominal pain, chronic  * Hypokalemia. Patient did not tolerate IV replacement. Added oral replacement. Repeat showed normal range. Prescription for potassium chloride orally given at discharge.  * Diabetes mellitus type 2. Oral medications held. Sliding scale insulin.  * Hypertension, stable.   discussed care with her brother and discharged home.  CONSULTS OBTAINED:  Treatment Team:  Manya Silvas, MD  DRUG ALLERGIES:   Allergies  Allergen Reactions  . Penicillins Swelling and Other (See Comments)    Patient is unable to answer questions about her allergy    DISCHARGE MEDICATIONS:   Discharge Medication List as of 11/19/2014 12:39 PM    START taking these medications   Details  lactulose (CHRONULAC) 10 GM/15ML solution Take 45 mLs (30 g total) by mouth 3 (three) times daily.,  Starting 11/19/2014, Until Discontinued, Print      CONTINUE these medications which have CHANGED   Details  potassium citrate (UROCIT-K) 10 MEQ (1080 MG) SR tablet Take 2 tablets (20 mEq total) by mouth daily., Starting 11/19/2014, Until  Discontinued, Print      CONTINUE these medications which have NOT CHANGED   Details  metFORMIN (GLUCOPHAGE-XR) 500 MG 24 hr tablet Take 1,500 mg by mouth daily with breakfast., Until Discontinued, Historical Med    nadolol (CORGARD) 20 MG tablet Take 20 mg by mouth daily. , Starting 06/17/2012, Until Discontinued, Historical Med    pantoprazole (PROTONIX) 40 MG tablet Take 40 mg by mouth daily. , Until Discontinued, Historical Med    rifaximin (XIFAXAN) 550 MG TABS tablet Take 550 mg by mouth 2 (two) times daily., Until Discontinued, Historical Med    spironolactone (ALDACTONE) 25 MG tablet Take 25 mg by mouth daily., Until Discontinued, Historical Med    ondansetron (ZOFRAN ODT) 8 MG disintegrating tablet Take 1 tablet (8 mg total) by mouth every 8 (eight) hours as needed for nausea., Starting 07/03/2012, Until Discontinued, Normal      STOP taking these medications     cholecalciferol (VITAMIN D) 400 UNITS TABS      Multiple Vitamins-Minerals (HM COMPLETE 50+) TABS      pravastatin (PRAVACHOL) 40 MG tablet      vitamin B-12 (CYANOCOBALAMIN) 500 MCG tablet          Today    VITAL SIGNS:  Blood pressure 117/44, pulse 71, temperature 98.3 F (36.8 C), temperature source Oral, resp. rate 18, height 5\' 6"  (1.676 m), weight 63.005 kg (138 lb 14.4 oz), SpO2 99 %.  I/O:   Intake/Output Summary (Last 24 hours) at 11/19/14 1453 Last data filed at 11/19/14 1148  Gross per 24 hour  Intake 253.33 ml  Output    100 ml  Net 153.33 ml    PHYSICAL EXAMINATION:  Physical Exam  GENERAL:  69 y.o.-year-old patient lying in the bed with no acute distress.  LUNGS: Normal breath sounds bilaterally, no wheezing, rales,rhonchi or crepitation. No use of accessory muscles of respiration.  CARDIOVASCULAR: S1, S2 normal. No murmurs, rubs, or gallops.  ABDOMEN: Soft, non-tender, non-distended. Bowel sounds present. No organomegaly or mass.  NEUROLOGIC: Moves all 4 extremities. PSYCHIATRIC:  The patient is alert and oriented x 3.  SKIN: No obvious rash, lesion, or ulcer.   DATA REVIEW:   CBC  Recent Labs Lab 11/17/14 1908  WBC 6.0  HGB 12.5  HCT 36.8  PLT 123*    Chemistries   Recent Labs Lab 11/18/14 1808 11/19/14 0502  NA  --  140  K 4.2 4.1  CL  --  116*  CO2  --  19*  GLUCOSE  --  110*  BUN  --  9  CREATININE  --  0.69  CALCIUM  --  8.9  MG 1.7  --   AST  --  45*  ALT  --  19  ALKPHOS  --  103  BILITOT  --  2.1*    Cardiac Enzymes No results for input(s): TROPONINI in the last 168 hours.  Microbiology Results  Results for orders placed or performed in visit on 01/13/13  Urine culture     Status: None   Collection Time: 01/13/13  7:33 PM  Result Value Ref Range Status   Micro Text Report   Final       SOURCE: CLEAN CATCH    COMMENT  MIXED BACTERIAL ORGANISMS   COMMENT                   RESULTS SUGGESTIVE OF CONTAMINATION   ANTIBIOTIC                                                        RADIOLOGY:  US Abdomen Complete  11/18/2014   CLINICAL DATA:  Abdominal pain for 3 days.  EXAM: ULTRASOUND ABDOMEN COMPLETE  COMPARISON:  Ultrasound 07/14/2013 CT 01/19/2013  FINDINGS: Gallbladder: Surgically absent.  Common bile duct: Diameter: 6.5 mm  Liver: Nodular hepatic contours. No focal lesion identified. Mildly coarsened in parenchymal echogenicity. Normal directional flow in the main portal vein.  IVC: No abnormality visualized.  Pancreas: Visualized portion unremarkable.  Spleen: Size and appearance within normal limits.  Right Kidney: Length: 11.6 cm. Echogenicity within normal limits. No mass or hydronephrosis visualized.  Left Kidney: Length: 10.8 cm. Echogenicity within normal limits. No mass or hydronephrosis visualized.  Abdominal aorta: No aneurysm visualized.  2.1 cm maximal dimension.  Other findings: None.  No ascites.  IMPRESSION: 1. Nodular hepatic contours consistent with cirrhosis. No evidence of focal hepatic lesion.  2. Postcholecystectomy. Biliary prominence is likely secondary 2 postcholecystectomy state, the common bile duct has diminished in caliber from prior exam.   Electronically Signed   By: Jeb Levering M.D.   On: 11/18/2014 03:34      Follow up with PCP in 1 week.  Management plans discussed with the patient, family and they are in agreement.  CODE STATUS:     Code Status Orders        Start     Ordered   11/18/14 0121  Full code   Continuous     11/18/14 0120      TOTAL TIME TAKING CARE OF THIS PATIENT ON DAY OF DISCHARGE: more than 30 minutes.    Gloria Cole R M.D on 11/19/2014 at 2:53 PM  Between 7am to 6pm - Pager - [phone]  After 6pm go to [email or web address] - password [email or web address] [facility]  Office  [phone]  CC: Primary care physician; Kirk Ruths., MD     Note: This dictation was prepared with Dragon dictation along with smaller phrase technology. Any transcriptional errors that result from this process are unintentional.

## 2014-11-19 NOTE — Discharge Instructions (Addendum)
°  DIET:  Diabetic diet  DISCHARGE CONDITION:  Stable  ACTIVITY:  Activity as tolerated  OXYGEN:  Home Oxygen: No.   Oxygen Delivery: room air  DISCHARGE LOCATION:  home   If you experience worsening of your admission symptoms, develop shortness of breath, life threatening emergency, suicidal or homicidal thoughts you must seek medical attention immediately by calling 911 or calling your MD immediately  if symptoms less severe.  You Must read complete instructions/literature along with all the possible adverse reactions/side effects for all the Medicines you take and that have been prescribed to you. Take any new Medicines after you have completely understood and accpet all the possible adverse reactions/side effects.   Please note  You were cared for by a hospitalist during your hospital stay. If you have any questions about your discharge medications or the care you received while you were in the hospital after you are discharged, you can call the unit and asked to speak with the hospitalist on call if the hospitalist that took care of you is not available. Once you are discharged, your primary care physician will handle any further medical issues. Please note that NO REFILLS for any discharge medications will be authorized once you are discharged, as it is imperative that you return to your primary care physician (or establish a relationship with a primary care physician if you do not have one) for your aftercare needs so that they can reassess your need for medications and monitor your lab values.  Take lactulose 3 times a day as recommended. Decrease to 2 times a day if you have more than bowel movements a day.

## 2014-11-19 NOTE — Care Management Note (Signed)
Case Management Note  Patient Details  Name: Gloria Cole MRN: [id number] Date of Birth: [birth year]  Subjective/Objective:                 Patient admitted from home with Acute encephalopathy.  Patient lives at home alone, however from time to time her brother and sister in law stay with her from time to time.  Patient states that she has a lot of family support.  Patient states that she uses [facility] on [address], and is able to obtain all of her home medications.  Patient states that she does not have any home equipment , and drives herself when needed.  No home needs anticipated.    Action/Plan: RNCM signing off   Expected Discharge Date:                  Expected Discharge Plan:     In-House Referral:     Discharge planning Services     Post Acute Care Choice:    Choice offered to:     DME Arranged:    DME Agency:     HH Arranged:    [place] Agency:     Status of Service:     Medicare Important Message Given:    Date Medicare IM Given:    Medicare IM give by:    Date Additional Medicare IM Given:    Additional Medicare Important Message give by:     If discussed at [place] of Stay Meetings, dates discussed:    Additional CommentsBeverly Sessions, RN 11/19/2014, 2:04 PM

## 2014-12-15 ENCOUNTER — Encounter: Payer: Self-pay | Admitting: *Deleted

## 2014-12-16 ENCOUNTER — Ambulatory Visit: Payer: Medicare Other | Admitting: Anesthesiology

## 2014-12-16 ENCOUNTER — Ambulatory Visit
Admission: RE | Admit: 2014-12-16 | Discharge: 2014-12-16 | Disposition: A | Discharge status: Home / Self Care | Payer: Medicare Other | Source: Ambulatory Visit | Attending: Gastroenterology | Admitting: Gastroenterology

## 2014-12-16 ENCOUNTER — Encounter
Admission: RE | Disposition: A | Discharge status: Home / Self Care | Payer: Self-pay | Source: Ambulatory Visit | Attending: Gastroenterology

## 2014-12-16 DIAGNOSIS — Z9889 Other specified postprocedural states: Secondary | ICD-10-CM | POA: Diagnosis not present

## 2014-12-16 DIAGNOSIS — K7460 Unspecified cirrhosis of liver: Secondary | ICD-10-CM | POA: Insufficient documentation

## 2014-12-16 DIAGNOSIS — Z880 Allergy status to penicillin: Secondary | ICD-10-CM | POA: Insufficient documentation

## 2014-12-16 DIAGNOSIS — R109 Unspecified abdominal pain: Secondary | ICD-10-CM | POA: Insufficient documentation

## 2014-12-16 DIAGNOSIS — Z86010 Personal history of colonic polyps: Secondary | ICD-10-CM | POA: Diagnosis not present

## 2014-12-16 DIAGNOSIS — F17210 Nicotine dependence, cigarettes, uncomplicated: Secondary | ICD-10-CM | POA: Diagnosis not present

## 2014-12-16 DIAGNOSIS — G8929 Other chronic pain: Secondary | ICD-10-CM | POA: Insufficient documentation

## 2014-12-16 DIAGNOSIS — E876 Hypokalemia: Secondary | ICD-10-CM | POA: Diagnosis not present

## 2014-12-16 DIAGNOSIS — Z90710 Acquired absence of both cervix and uterus: Secondary | ICD-10-CM | POA: Insufficient documentation

## 2014-12-16 DIAGNOSIS — K3189 Other diseases of stomach and duodenum: Secondary | ICD-10-CM | POA: Insufficient documentation

## 2014-12-16 DIAGNOSIS — Z8349 Family history of other endocrine, nutritional and metabolic diseases: Secondary | ICD-10-CM | POA: Diagnosis not present

## 2014-12-16 DIAGNOSIS — Z833 Family history of diabetes mellitus: Secondary | ICD-10-CM | POA: Insufficient documentation

## 2014-12-16 DIAGNOSIS — I10 Essential (primary) hypertension: Secondary | ICD-10-CM | POA: Insufficient documentation

## 2014-12-16 DIAGNOSIS — Z79899 Other long term (current) drug therapy: Secondary | ICD-10-CM | POA: Insufficient documentation

## 2014-12-16 DIAGNOSIS — Z9049 Acquired absence of other specified parts of digestive tract: Secondary | ICD-10-CM | POA: Insufficient documentation

## 2014-12-16 DIAGNOSIS — Z794 Long term (current) use of insulin: Secondary | ICD-10-CM | POA: Diagnosis not present

## 2014-12-16 DIAGNOSIS — E119 Type 2 diabetes mellitus without complications: Secondary | ICD-10-CM | POA: Insufficient documentation

## 2014-12-16 DIAGNOSIS — E785 Hyperlipidemia, unspecified: Secondary | ICD-10-CM | POA: Diagnosis not present

## 2014-12-16 DIAGNOSIS — K766 Portal hypertension: Secondary | ICD-10-CM | POA: Diagnosis not present

## 2014-12-16 DIAGNOSIS — I8500 Esophageal varices without bleeding: Secondary | ICD-10-CM | POA: Diagnosis not present

## 2014-12-16 DIAGNOSIS — R1310 Dysphagia, unspecified: Secondary | ICD-10-CM | POA: Diagnosis present

## 2014-12-16 DIAGNOSIS — B1920 Unspecified viral hepatitis C without hepatic coma: Secondary | ICD-10-CM | POA: Diagnosis not present

## 2014-12-16 HISTORY — DX: Other diseases of stomach and duodenum: K31.89

## 2014-12-16 HISTORY — DX: Portal hypertension: K76.6

## 2014-12-16 HISTORY — PX: ESOPHAGOGASTRODUODENOSCOPY (EGD) WITH PROPOFOL: SHX5813

## 2014-12-16 LAB — GLUCOSE, CAPILLARY: Glucose-Capillary: 116 mg/dL — ABNORMAL HIGH (ref 65–99)

## 2014-12-16 SURGERY — ESOPHAGOGASTRODUODENOSCOPY (EGD) WITH PROPOFOL
Anesthesia: General

## 2014-12-16 MED ORDER — MIDAZOLAM HCL 2 MG/2ML IJ SOLN
INTRAMUSCULAR | Status: DC | PRN
  Administered 2014-12-16: 1 mg via INTRAVENOUS

## 2014-12-16 MED ORDER — LIDOCAINE HCL (CARDIAC) 20 MG/ML IV SOLN
INTRAVENOUS | Status: DC | PRN
  Administered 2014-12-16: 50 mg via INTRAVENOUS

## 2014-12-16 MED ORDER — SODIUM CHLORIDE 0.9 % IV SOLN
INTRAVENOUS | Status: DC
Start: 2014-12-16 — End: 2014-12-16

## 2014-12-16 MED ORDER — PROPOFOL 500 MG/50ML IV EMUL: INTRAVENOUS | Status: DC | PRN

## 2014-12-16 MED ORDER — FENTANYL CITRATE (PF) 100 MCG/2ML IJ SOLN
INTRAMUSCULAR | Status: DC | PRN
  Administered 2014-12-16: 50 ug via INTRAVENOUS
  Administered 2014-12-16 (×2): 25 ug via INTRAVENOUS

## 2014-12-16 MED ORDER — SODIUM CHLORIDE 0.9 % IV SOLN
INTRAVENOUS | Status: DC
  Administered 2014-12-16: 1000 mL via INTRAVENOUS

## 2014-12-16 MED ORDER — PROPOFOL 500 MG/50ML IV EMUL
INTRAVENOUS | Status: DC | PRN
  Administered 2014-12-16: 120 ug/kg/min via INTRAVENOUS

## 2014-12-16 NOTE — Transfer of Care (Signed)
Immediate Anesthesia Transfer of Care Note  Patient: Gloria Cole  Procedure(s) Performed: Procedure(s): ESOPHAGOGASTRODUODENOSCOPY (EGD) WITH PROPOFOL (N/A)  Patient Location: PACU  Anesthesia Type:General  Level of Consciousness: awake, alert  and sedated  Airway & Oxygen Therapy: Patient Spontanous Breathing and Patient connected to nasal cannula oxygen  Post-op Assessment: Report given to RN  Post vital signs: Reviewed and stable  Last Vitals:  Filed Vitals:   12/16/14 0935  BP: 130/64  Pulse: 62  Temp:   Resp: 17    Complications: No apparent anesthesia complications

## 2014-12-16 NOTE — Anesthesia Postprocedure Evaluation (Signed)
  Anesthesia Post-op Note  Patient: Gloria Cole  Procedure(s) Performed: Procedure(s): ESOPHAGOGASTRODUODENOSCOPY (EGD) WITH PROPOFOL (N/A)  Anesthesia type:General  Patient location: PACU  Post pain: Pain level controlled  Post assessment: Post-op Vital signs reviewed, Patient's Cardiovascular Status Stable, Respiratory Function Stable, Patent Airway and No signs of Nausea or vomiting  Post vital signs: Reviewed and stable  Last Vitals:  Filed Vitals:   12/16/14 1040  BP: 123/74  Pulse: 95  Temp:   Resp: 18    Level of consciousness: awake, alert  and patient cooperative  Complications: No apparent anesthesia complications

## 2014-12-16 NOTE — H&P (Signed)
  Date of Initial H&P: 11/29/2014  History reviewed, patient examined, no change in status, stable for surgery.

## 2014-12-16 NOTE — Anesthesia Procedure Notes (Signed)
Performed by: COOK-MARTIN, Lucia Mccreadie Pre-anesthesia Checklist: Patient identified, Emergency Drugs available, Suction available, Patient being monitored and Timeout performed Patient Re-evaluated:Patient Re-evaluated prior to inductionOxygen Delivery Method: Nasal cannula Preoxygenation: Pre-oxygenation with 100% oxygen Intubation Type: IV induction Airway Equipment and Method: Bite block Placement Confirmation: positive ETCO2     

## 2014-12-16 NOTE — Op Note (Signed)
[facility] Patient Name: Gloria Cole Procedure Date: 12/16/2014 10:14 AM MRN: [id number] Account #: [id number] Date of Birth: 19-Nov-1945 Admit Type: Outpatient Age: 69 Room: [facility] ENDO ROOM 4 Gender: Female Note Status: Finalized Procedure:         Upper GI endoscopy Indications:       Follow-up of esophageal varices, liver cirrhosis Providers:         [name]. Candace Cruise, MD Referring MD:      Ocie Cornfield. Ouida Sills, MD (Referring MD) Medicines:         Monitored Anesthesia Care Complications:     No immediate complications. Procedure:         Pre-Anesthesia Assessment:                    - Prior to the procedure, a History and Physical was                     performed, and patient medications, allergies and                     sensitivities were reviewed. The patient's tolerance of                     previous anesthesia was reviewed.                    - The risks and benefits of the procedure and the sedation                     options and risks were discussed with the patient. All                     questions were answered and informed consent was obtained.                    - After reviewing the risks and benefits, the patient was                     deemed in satisfactory condition to undergo the procedure.                    After obtaining informed consent, the endoscope was passed                     under direct vision. Throughout the procedure, the                     patient's blood pressure, pulse, and oxygen saturations                     were monitored continuously. The Endoscope was introduced                     through the mouth, and advanced to the second part of                     duodenum. The upper GI endoscopy was accomplished without                     difficulty. The patient tolerated the procedure well. Findings:      Grade II varices were found in the lower third of the esophagus. They       were medium in largest  diameter. Two bands were successfully placed with  complete eradication, resulting in deflation of varices. There was no       bleeding during the procedure.      The exam was otherwise without abnormality.      Mild portal hypertensive gastropathy was found in the gastric body.      The exam was otherwise without abnormality.      The examined duodenum was normal. Impression:        - Grade II esophageal varices. Completely eradicated.                     Banded.                    - The examination was otherwise normal.                    - Portal hypertensive gastropathy.                    - The examination was otherwise normal.                    - Normal examined duodenum.                    - No specimens collected. Recommendation:    - Discharge patient to home.                    - Observe patient's clinical course.                    - Continue present medications.                    - Full liquid diet today.                    - The findings and recommendations were discussed with the                     patient.                    - Repeat EGD in 2 months. Procedure Code(s): --- Professional ---                    [phone], Esophagogastroduodenoscopy, flexible, transoral;                     with band ligation of esophageal/gastric varices Diagnosis Code(s): --- Professional ---                    I85.00, Esophageal varices without bleeding                    K76.6, Portal hypertension                    K31.89, Other diseases of stomach and duodenum CPT copyright 2014 American Medical Association. All rights reserved. The codes documented in this report are preliminary and upon coder review may  be revised to meet current compliance requirements. Hulen Luster, MD 12/16/2014 10:29:37 AM This report has been signed electronically. Number of Addenda: 0 Note Initiated On: 12/16/2014 10:14 AM      [facility]

## 2014-12-16 NOTE — Anesthesia Preprocedure Evaluation (Signed)
Anesthesia Evaluation  Patient identified by MRN, date of birth, ID band Patient awake    Reviewed: Allergy & Precautions, NPO status , Patient's Chart, lab work & pertinent test results  Airway Mallampati: II       Dental  (+) Missing   Pulmonary COPD, Current Smoker,     + decreased breath sounds      Cardiovascular Exercise Tolerance: Poor hypertension, Pt. on home beta blockers  Rate:Normal     Neuro/Psych    GI/Hepatic   Endo/Other  diabetes, Type 2, Oral Hypoglycemic Agents  Renal/GU      Musculoskeletal   Abdominal   Peds  Hematology   Anesthesia Other Findings   Reproductive/Obstetrics                             Anesthesia Physical Anesthesia Plan  ASA: III  Anesthesia Plan: General   Post-op Pain Management:    Induction: Intravenous  Airway Management Planned: Nasal Cannula  Additional Equipment:   Intra-op Plan:   Post-operative Plan:   Informed Consent: I have reviewed the patients History and Physical, chart, labs and discussed the procedure including the risks, benefits and alternatives for the proposed anesthesia with the patient or authorized representative who has indicated his/her understanding and acceptance.     Plan Discussed with: CRNA  Anesthesia Plan Comments:         Anesthesia Quick Evaluation

## 2014-12-19 ENCOUNTER — Encounter: Payer: Self-pay | Admitting: Gastroenterology

## 2015-01-18 ENCOUNTER — Other Ambulatory Visit: Payer: Self-pay | Admitting: Internal Medicine

## 2015-01-18 DIAGNOSIS — Z1231 Encounter for screening mammogram for malignant neoplasm of breast: Secondary | ICD-10-CM

## 2015-01-31 ENCOUNTER — Ambulatory Visit: Payer: Medicare Other | Attending: Internal Medicine

## 2015-02-02 ENCOUNTER — Ambulatory Visit
Admission: RE | Admit: 2015-02-02 | Discharge: 2015-02-02 | Disposition: A | Discharge status: Home / Self Care | Payer: Medicare Other | Source: Ambulatory Visit | Attending: Internal Medicine | Admitting: Internal Medicine

## 2015-02-02 ENCOUNTER — Other Ambulatory Visit: Payer: Self-pay | Admitting: Internal Medicine

## 2015-02-02 DIAGNOSIS — Z1231 Encounter for screening mammogram for malignant neoplasm of breast: Secondary | ICD-10-CM

## 2015-02-07 ENCOUNTER — Other Ambulatory Visit: Payer: Self-pay | Admitting: Internal Medicine

## 2015-02-07 DIAGNOSIS — R928 Other abnormal and inconclusive findings on diagnostic imaging of breast: Secondary | ICD-10-CM

## 2015-02-10 ENCOUNTER — Ambulatory Visit
Admission: RE | Admit: 2015-02-10 | Discharge: 2015-02-10 | Disposition: A | Discharge status: Home / Self Care | Payer: Medicare Other | Source: Ambulatory Visit | Attending: Internal Medicine | Admitting: Internal Medicine

## 2015-02-10 DIAGNOSIS — R928 Other abnormal and inconclusive findings on diagnostic imaging of breast: Secondary | ICD-10-CM | POA: Insufficient documentation

## 2015-02-17 ENCOUNTER — Ambulatory Visit: Payer: Medicare Other | Admitting: Dietician

## 2015-03-03 ENCOUNTER — Encounter: Payer: Self-pay | Admitting: *Deleted

## 2015-03-04 ENCOUNTER — Ambulatory Visit
Admission: RE | Admit: 2015-03-04 | Discharge: 2015-03-04 | Disposition: A | Discharge status: Home / Self Care | Payer: Medicare Other | Source: Ambulatory Visit | Attending: Gastroenterology | Admitting: Gastroenterology

## 2015-03-04 ENCOUNTER — Encounter
Admission: RE | Disposition: A | Discharge status: Home / Self Care | Payer: Self-pay | Source: Ambulatory Visit | Attending: Gastroenterology

## 2015-03-04 ENCOUNTER — Encounter: Payer: Self-pay | Admitting: *Deleted

## 2015-03-04 ENCOUNTER — Ambulatory Visit: Payer: Medicare Other | Admitting: Anesthesiology

## 2015-03-04 DIAGNOSIS — K766 Portal hypertension: Secondary | ICD-10-CM | POA: Diagnosis not present

## 2015-03-04 DIAGNOSIS — I8500 Esophageal varices without bleeding: Secondary | ICD-10-CM | POA: Diagnosis not present

## 2015-03-04 DIAGNOSIS — K635 Polyp of colon: Secondary | ICD-10-CM | POA: Insufficient documentation

## 2015-03-04 DIAGNOSIS — K5730 Diverticulosis of large intestine without perforation or abscess without bleeding: Secondary | ICD-10-CM | POA: Diagnosis not present

## 2015-03-04 DIAGNOSIS — K625 Hemorrhage of anus and rectum: Secondary | ICD-10-CM | POA: Diagnosis present

## 2015-03-04 DIAGNOSIS — R110 Nausea: Secondary | ICD-10-CM | POA: Diagnosis not present

## 2015-03-04 DIAGNOSIS — N189 Chronic kidney disease, unspecified: Secondary | ICD-10-CM | POA: Diagnosis not present

## 2015-03-04 DIAGNOSIS — R1310 Dysphagia, unspecified: Secondary | ICD-10-CM | POA: Diagnosis present

## 2015-03-04 DIAGNOSIS — K648 Other hemorrhoids: Secondary | ICD-10-CM | POA: Diagnosis not present

## 2015-03-04 DIAGNOSIS — Z7984 Long term (current) use of oral hypoglycemic drugs: Secondary | ICD-10-CM | POA: Insufficient documentation

## 2015-03-04 DIAGNOSIS — Z87891 Personal history of nicotine dependence: Secondary | ICD-10-CM | POA: Insufficient documentation

## 2015-03-04 DIAGNOSIS — E1122 Type 2 diabetes mellitus with diabetic chronic kidney disease: Secondary | ICD-10-CM | POA: Diagnosis not present

## 2015-03-04 DIAGNOSIS — K7460 Unspecified cirrhosis of liver: Secondary | ICD-10-CM | POA: Diagnosis not present

## 2015-03-04 DIAGNOSIS — I129 Hypertensive chronic kidney disease with stage 1 through stage 4 chronic kidney disease, or unspecified chronic kidney disease: Secondary | ICD-10-CM | POA: Insufficient documentation

## 2015-03-04 DIAGNOSIS — Z79899 Other long term (current) drug therapy: Secondary | ICD-10-CM | POA: Diagnosis not present

## 2015-03-04 DIAGNOSIS — D124 Benign neoplasm of descending colon: Secondary | ICD-10-CM | POA: Insufficient documentation

## 2015-03-04 DIAGNOSIS — D123 Benign neoplasm of transverse colon: Secondary | ICD-10-CM | POA: Diagnosis not present

## 2015-03-04 DIAGNOSIS — D122 Benign neoplasm of ascending colon: Secondary | ICD-10-CM | POA: Diagnosis not present

## 2015-03-04 DIAGNOSIS — K222 Esophageal obstruction: Secondary | ICD-10-CM | POA: Insufficient documentation

## 2015-03-04 DIAGNOSIS — Z86010 Personal history of colonic polyps: Secondary | ICD-10-CM | POA: Insufficient documentation

## 2015-03-04 DIAGNOSIS — E785 Hyperlipidemia, unspecified: Secondary | ICD-10-CM | POA: Insufficient documentation

## 2015-03-04 DIAGNOSIS — K3189 Other diseases of stomach and duodenum: Secondary | ICD-10-CM | POA: Insufficient documentation

## 2015-03-04 HISTORY — PX: COLONOSCOPY WITH PROPOFOL: SHX5780

## 2015-03-04 HISTORY — PX: ESOPHAGOGASTRODUODENOSCOPY (EGD) WITH PROPOFOL: SHX5813

## 2015-03-04 LAB — GLUCOSE, CAPILLARY: GLUCOSE-CAPILLARY: 102 mg/dL — AB (ref 65–99)

## 2015-03-04 SURGERY — ESOPHAGOGASTRODUODENOSCOPY (EGD) WITH PROPOFOL
Anesthesia: General

## 2015-03-04 MED ORDER — PROPOFOL 10 MG/ML IV BOLUS
INTRAVENOUS | Status: DC | PRN
  Administered 2015-03-04: 40 mg via INTRAVENOUS

## 2015-03-04 MED ORDER — SODIUM CHLORIDE 0.9 % IV SOLN
INTRAVENOUS | Status: DC
  Administered 2015-03-04: 1000 mL via INTRAVENOUS

## 2015-03-04 MED ORDER — PHENYLEPHRINE HCL 10 MG/ML IJ SOLN
INTRAMUSCULAR | Status: DC | PRN
  Administered 2015-03-04: 200 ug via INTRAVENOUS
  Administered 2015-03-04 (×2): 100 ug via INTRAVENOUS

## 2015-03-04 MED ORDER — LIDOCAINE HCL (CARDIAC) 20 MG/ML IV SOLN
INTRAVENOUS | Status: DC | PRN
  Administered 2015-03-04: 60 mg via INTRAVENOUS

## 2015-03-04 MED ORDER — MIDAZOLAM HCL 2 MG/2ML IJ SOLN
INTRAMUSCULAR | Status: DC | PRN
  Administered 2015-03-04: 1 mg via INTRAVENOUS

## 2015-03-04 MED ORDER — PROPOFOL 500 MG/50ML IV EMUL
INTRAVENOUS | Status: DC | PRN
  Administered 2015-03-04: 100 ug/kg/min via INTRAVENOUS

## 2015-03-04 MED ORDER — SODIUM CHLORIDE 0.9 % IV SOLN
INTRAVENOUS | Status: DC
  Administered 2015-03-04: 10:00:00 via INTRAVENOUS

## 2015-03-04 MED ORDER — GLYCOPYRROLATE 0.2 MG/ML IJ SOLN
INTRAMUSCULAR | Status: DC | PRN
  Administered 2015-03-04: 0.1 mg via INTRAVENOUS

## 2015-03-04 NOTE — Transfer of Care (Signed)
Immediate Anesthesia Transfer of Care Note  Patient: Winola Bechard  Procedure(s) Performed: Procedure(s): ESOPHAGOGASTRODUODENOSCOPY (EGD) WITH PROPOFOL (N/A) COLONOSCOPY WITH PROPOFOL (N/A)  Patient Location: Endoscopy Unit  Anesthesia Type:General  Level of Consciousness: awake, alert , oriented and patient cooperative  Airway & Oxygen Therapy: Patient Spontanous Breathing and Patient connected to nasal cannula oxygen  Post-op Assessment: Report given to RN, Post -op Vital signs reviewed and stable and Patient moving all extremities X 4  Post vital signs: Reviewed and stable  Last Vitals:  Filed Vitals:   03/04/15 0922  BP: 139/59  Pulse: 66  Temp: 36.1 C  Resp: 18    Complications: No apparent anesthesia complications

## 2015-03-04 NOTE — H&P (Signed)
  Date of Initial H&P: 02/02/2015  History reviewed, patient examined, no change in status, stable for surgery.

## 2015-03-04 NOTE — Op Note (Signed)
[facility] Patient Name: Gloria Cole Procedure Date: 03/04/2015 9:51 AM MRN: [id number] Account #: [id number] Date of Birth: Jul 03, 1945 Admit Type: Outpatient Age: 70 Room: [facility] ENDO ROOM 4 Gender: Female Note Status: Finalized Procedure:         Colonoscopy Indications:       Rectal bleeding, Personal history of colonic polyps Providers:         [name]. Candace Cruise, MD Referring MD:      Ocie Cornfield. Ouida Sills, MD (Referring MD) Medicines:         Monitored Anesthesia Care Complications:     No immediate complications. Procedure:         Pre-Anesthesia Assessment:                    - Prior to the procedure, a History and Physical was                     performed, and patient medications, allergies and                     sensitivities were reviewed. The patient's tolerance of                     previous anesthesia was reviewed.                    - The risks and benefits of the procedure and the sedation                     options and risks were discussed with the patient. All                     questions were answered and informed consent was obtained.                    - After reviewing the risks and benefits, the patient was                     deemed in satisfactory condition to undergo the procedure.                    After obtaining informed consent, the colonoscope was                     passed under direct vision. Throughout the procedure, the                     patient's blood pressure, pulse, and oxygen saturations                     were monitored continuously. The Olympus CF-Q160AL                     colonoscope (S#. [id number]) was introduced through the anus                     and advanced to the the cecum, identified by appendiceal                     orifice and ileocecal valve. The colonoscopy was performed                     without difficulty. The patient tolerated the procedure  well. The quality of the  bowel preparation was fair. Findings:      The perianal exam findings include non-thrombosed internal hemorrhoids.      Multiple small and large-mouthed diverticula were found in the sigmoid       colon.      Eight sessile polyps were found in the sigmoid colon, in the descending       colon, at the splenic flexure, in the transverse colon and at the       hepatic flexure. The polyps were small in size. These polyps were       removed with a cold snare. Resection and retrieval were complete.      The exam was otherwise without abnormality. Impression:        - Non-thrombosed internal hemorrhoids found on perianal                     exam.                    - Diverticulosis in the sigmoid colon.                    - Eight small polyps in the sigmoid colon, in the                     descending colon, at the splenic flexure, in the                     transverse colon and at the hepatic flexure. Resected and                     retrieved.                    - The examination was otherwise normal. Recommendation:    - Discharge patient to home.                    - Await pathology results.                    - Repeat colonoscopy in 3 years for surveillance based on                     pathology results.                    - The findings and recommendations were discussed with the                     patient. Procedure Code(s): --- Professional ---                    [phone], Colonoscopy, flexible; with removal of tumor(s),                     polyp(s), or other lesion(s) by snare technique Diagnosis Code(s): --- Professional ---                    K64.8, Other hemorrhoids                    D12.5, Benign neoplasm of sigmoid colon                    D12.4, Benign neoplasm of descending colon  D12.3, Benign neoplasm of transverse colon                    K62.5, Hemorrhage of anus and rectum                    Z86.010, Personal history of colonic polyps                     K57.30, Diverticulosis of large intestine without                     perforation or abscess without bleeding CPT copyright 2014 American [employer]. All rights reserved. The codes documented in this report are preliminary and upon coder review may  be revised to meet current compliance requirements. Hulen Luster, MD 03/04/2015 10:30:20 AM This report has been signed electronically. Number of Addenda: 0 Note Initiated On: 03/04/2015 9:51 AM Scope Withdrawal Time: 0 hours 11 minutes 38 seconds  Total Procedure Duration: 0 hours 21 minutes 59 seconds       [facility]

## 2015-03-04 NOTE — Op Note (Signed)
[facility] Patient Name: Gloria Cole Procedure Date: 03/04/2015 9:51 AM MRN: [id number] Account #: [id number] Date of Birth: [birth year] Admit Type: Outpatient Age: 70 Room: [facility] ENDO ROOM 4 Gender: Female Note Status: Finalized Procedure:         Upper GI endoscopy Indications:       Dysphagia, Hx of cirrhosis Providers:         [name]. Candace Cruise, MD Referring MD:      Ocie Cornfield. Ouida Sills, MD (Referring MD) Medicines:         Monitored Anesthesia Care Complications:     No immediate complications. Procedure:         Pre-Anesthesia Assessment:                    - Prior to the procedure, a History and Physical was                     performed, and patient medications, allergies and                     sensitivities were reviewed. The patient's tolerance of                     previous anesthesia was reviewed.                    - The risks and benefits of the procedure and the sedation                     options and risks were discussed with the patient. All                     questions were answered and informed consent was obtained.                    - After reviewing the risks and benefits, the patient was                     deemed in satisfactory condition to undergo the procedure.                    After obtaining informed consent, the endoscope was passed                     under direct vision. Throughout the procedure, the                     patient's blood pressure, pulse, and oxygen saturations                     were monitored continuously. The Endoscope was introduced                     through the mouth, and advanced to the second part of                     duodenum. The upper GI endoscopy was accomplished without                     difficulty. The patient tolerated the procedure well. Findings:      Grade I varices were found in the lower third of the esophagus. They       were small in largest diameter.      One mild  benign-appearing,  intrinsic stenosis was found at the       gastroesophageal junction. The scope was withdrawn. Dilation was       performed with a Maloney dilator with mild resistance at 28 Fr.      The exam was otherwise without abnormality.      The entire examined stomach was normal.      The examined duodenum was normal. Impression:        - Grade I esophageal varices.                    - Benign-appearing esophageal stenosis. Dilated.                    - The examination was otherwise normal.                    - Normal stomach.                    - Normal examined duodenum.                    - No specimens collected. Recommendation:    - Discharge patient to home.                    - Observe patient's clinical course.                    - The findings and recommendations were discussed with the                     patient. Procedure Code(s): --- Professional ---                    [id number], Esophagogastroduodenoscopy, flexible, transoral;                     diagnostic, including collection of specimen(s) by                     brushing or washing, when performed (separate procedure)                    43450, Dilation of esophagus, by unguided sound or bougie,                     single or multiple passes Diagnosis Code(s): --- Professional ---                    I85.00, Esophageal varices without bleeding                    K22.2, Esophageal obstruction                    R13.10, Dysphagia, unspecified CPT copyright 2014 American Medical Association. All rights reserved. The codes documented in this report are preliminary and upon coder review may  be revised to meet current compliance requirements. Hulen Luster, MD 03/04/2015 10:03:24 AM This report has been signed electronically. Number of Addenda: 0 Note Initiated On: 03/04/2015 9:51 AM      [facility]

## 2015-03-04 NOTE — Anesthesia Preprocedure Evaluation (Signed)
Anesthesia Evaluation  Patient identified by MRN, date of birth, ID band Patient awake    Reviewed: Allergy & Precautions, H&P , NPO status , Patient's Chart, lab work & pertinent test results, reviewed documented beta blocker date and time   Airway Mallampati: II  TM Distance: >3 FB Neck ROM: full    Dental no notable dental hx.    Pulmonary neg pulmonary ROS, Current Smoker,    Pulmonary exam normal breath sounds clear to auscultation       Cardiovascular Exercise Tolerance: Good hypertension, negative cardio ROS   Rhythm:regular Rate:Normal     Neuro/Psych negative neurological ROS  negative psych ROS   GI/Hepatic negative GI ROS, Neg liver ROS, GERD  ,(+) Hepatitis -  Endo/Other  negative endocrine ROSdiabetes  Renal/GU negative Renal ROS  negative genitourinary   Musculoskeletal   Abdominal   Peds  Hematology negative hematology ROS (+)   Anesthesia Other Findings   Reproductive/Obstetrics negative OB ROS                             Anesthesia Physical Anesthesia Plan  ASA: III  Anesthesia Plan: General   Post-op Pain Management:    Induction:   Airway Management Planned:   Additional Equipment:   Intra-op Plan:   Post-operative Plan:   Informed Consent: I have reviewed the patients History and Physical, chart, labs and discussed the procedure including the risks, benefits and alternatives for the proposed anesthesia with the patient or authorized representative who has indicated his/her understanding and acceptance.   Dental Advisory Given  Plan Discussed with: CRNA  Anesthesia Plan Comments:         Anesthesia Quick Evaluation

## 2015-03-06 NOTE — Anesthesia Postprocedure Evaluation (Signed)
Anesthesia Post Note  Patient: Gloria Cole  Procedure(s) Performed: Procedure(s) (LRB): ESOPHAGOGASTRODUODENOSCOPY (EGD) WITH PROPOFOL (N/A) COLONOSCOPY WITH PROPOFOL (N/A)  Patient location during evaluation: PACU Anesthesia Type: General Level of consciousness: awake and alert Pain management: pain level controlled Vital Signs Assessment: post-procedure vital signs reviewed and stable Respiratory status: spontaneous breathing, nonlabored ventilation, respiratory function stable and patient connected to nasal cannula oxygen Cardiovascular status: blood pressure returned to baseline and stable Postop Assessment: no signs of nausea or vomiting Anesthetic complications: no    Last Vitals:  Filed Vitals:   03/04/15 1110 03/04/15 1120  BP: 122/58 131/66  Pulse: 79 76  Temp:    Resp: 18 19    Last Pain:  Filed Vitals:   03/05/15 1552  PainSc: 0-No pain                 Molli Barrows

## 2015-03-07 LAB — SURGICAL PATHOLOGY

## 2015-03-09 ENCOUNTER — Encounter: Payer: Self-pay | Admitting: Gastroenterology

## 2015-03-17 ENCOUNTER — Encounter: Payer: Medicare Other | Attending: Physician Assistant | Admitting: Dietician

## 2015-03-17 VITALS — Ht 66.0 in | Wt 149.9 lb

## 2015-03-17 DIAGNOSIS — E119 Type 2 diabetes mellitus without complications: Secondary | ICD-10-CM | POA: Insufficient documentation

## 2015-03-17 DIAGNOSIS — K7460 Unspecified cirrhosis of liver: Secondary | ICD-10-CM | POA: Insufficient documentation

## 2015-03-17 DIAGNOSIS — K7469 Other cirrhosis of liver: Secondary | ICD-10-CM

## 2015-03-17 NOTE — Patient Instructions (Signed)
Balance meals with protein (meat or eggs or peanut butter or nuts or cheese or dried beans), starch (refer to list) and "free vegetables" (refer to list). Read labels for sodium with goal of no more than 2000 mg sodium per day. Use fresh or frozen vegetables rather than canned vegetables. Use Ms. DASH in place of salt. If eating a meal out, limit the sodium at the other meals. Include protein with all meals.  Limit sugar sweetened beverages. Refer to handout that shows examples of balancing meals and sample menu. Consider Boost Glucose control if can't eat a meal.

## 2015-03-17 NOTE — Progress Notes (Signed)
Medical Nutrition Therapy: Visit start time: [id number]  end time: 1640 Assessment:  Diagnosis: Type 2 diabetes, liver cirrhosis Past medical history: hx of Hepatitis C, HTN, hyperlipidemia Psychosocial issues/ stress concerns: Patient rates her stress as moderate and indicates she is dealing "ok" with her stress. Preferred learning method:  . Auditory . Visual Current weight: 149.9 lbs  Height: 66 in  Medications, supplements: see list Progress and evaluation:  Patient in for initial nutrition assessment. She reports that her fasting glucose is usually in the 120's. She states that she doesn't know what she should eat with the diagnosis of cirrhosis. She reports a relatively stable weight in recent months. She also states that she has decreased energy and questions if a better diet could help. Based on information given, her present intake is  possibly low in protein  on some days.She has decreased her sodium intake but continues to add salt in cooking and makes high sodium choices when dining "out". Includes salty snacks on a daily basis.She reports decreased appetite several days per week. 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  Physical activity:walks for exercise 2 days per week for 30 minutes.   Dietary Intake:  Usual eating pattern includes 3 meals and 3 snacks per day. Dining out frequency: 3 meals per week + school lunch  Breakfast: poptart/coffee with sugar or egg/toast Snack: package of peanut butter and crackers Lunch: salad; sometimes adds chicken, crackers, water, 12 oz. soda Snack: popcorn Supper: tacos or taco salad from [employer] or baked chicken, baked potato, salad, sugar sweetened tea Snack: cookies or ice cream Beverages: water, coffee with sugar, soda, sweet tea  Nutrition Care Education: Cirrhosis: Instructed on dietary guidelines for cirrhosis: low sodium diet, adequate protein,  low fat choices and adequate servings from all food groups Diabetes:  Discussed general diet guidelines for diabetes. Instructed on balancing protein, carbohydrate and non-starchy vegetables to be sure she has adequate intake of these nutrients. Nutritional Diagnosis:  Excessive sodium intake as related to salty snacks, high sodium choices when dining out as evidenced by diet history. Also, low intake of protein related to frequent meals that do not include a significant protein source as evidenced by diet history. Intervention:  Balance meals with protein (meat or eggs or peanut butter or nuts or cheese or dried beans), starch (refer to list) and "free vegetables" (refer to list). Read labels for sodium with goal of no more than 2000 mg sodium per day. Use fresh or frozen vegetables rather than canned vegetables. Use Ms. DASH in place of salt. If eating a meal out, limit the sodium at the other meals. Include protein with all meals.  Limit sugar sweetened beverages. Refer to handout that shows examples of balancing meals and sample menu. Consider Boost Glucose control if can't eat a meal.   Education Materials given:  . General diet guidelines for Diabetes . General diet guidelines for liver cirrhosis . Food lists/ Planning A Balanced Meal . Sample meal pattern/ menus . List of high sodium foods and substitutes to make . Goals/ instructions Learner/ who was taught:  . Patient   Level of understanding: . Partial understanding; needs review/ practice Learning barriers: . None  Willingness to learn/ readiness for change: . Acceptance, ready for change  Monitoring and Evaluation:  Patient prefers to wait before scheduling a follow-up appointment. Gave her my phone number and encouraged her to call if has questions or desires  further help regarding her diet/nutrition.

## 2015-05-09 ENCOUNTER — Other Ambulatory Visit
Admission: RE | Admit: 2015-05-09 | Discharge: 2015-05-09 | Disposition: A | Discharge status: Home / Self Care | Payer: Medicare Other | Source: Ambulatory Visit | Attending: Nurse Practitioner | Admitting: Nurse Practitioner

## 2015-05-09 DIAGNOSIS — R7989 Other specified abnormal findings of blood chemistry: Secondary | ICD-10-CM | POA: Diagnosis present

## 2015-05-09 LAB — AMMONIA: AMMONIA: 48 umol/L — AB (ref 9–35)

## 2015-06-10 ENCOUNTER — Encounter: Payer: Self-pay | Admitting: Emergency Medicine

## 2015-06-10 ENCOUNTER — Emergency Department: Payer: Medicare Other

## 2015-06-10 ENCOUNTER — Inpatient Hospital Stay
Admission: EM | Admit: 2015-06-10 | Discharge: 2015-06-13 | DRG: 443 | Disposition: A | Discharge status: Home Health | Payer: Medicare Other | Attending: Internal Medicine | Admitting: Internal Medicine

## 2015-06-10 DIAGNOSIS — K7460 Unspecified cirrhosis of liver: Secondary | ICD-10-CM | POA: Diagnosis present

## 2015-06-10 DIAGNOSIS — E876 Hypokalemia: Secondary | ICD-10-CM | POA: Diagnosis present

## 2015-06-10 DIAGNOSIS — R531 Weakness: Secondary | ICD-10-CM | POA: Diagnosis present

## 2015-06-10 DIAGNOSIS — K7290 Hepatic failure, unspecified without coma: Principal | ICD-10-CM | POA: Diagnosis present

## 2015-06-10 DIAGNOSIS — E785 Hyperlipidemia, unspecified: Secondary | ICD-10-CM | POA: Diagnosis present

## 2015-06-10 DIAGNOSIS — E1369 Other specified diabetes mellitus with other specified complication: Secondary | ICD-10-CM | POA: Diagnosis present

## 2015-06-10 DIAGNOSIS — Z880 Allergy status to penicillin: Secondary | ICD-10-CM

## 2015-06-10 DIAGNOSIS — F17210 Nicotine dependence, cigarettes, uncomplicated: Secondary | ICD-10-CM | POA: Diagnosis present

## 2015-06-10 DIAGNOSIS — Z79899 Other long term (current) drug therapy: Secondary | ICD-10-CM | POA: Diagnosis not present

## 2015-06-10 DIAGNOSIS — B1920 Unspecified viral hepatitis C without hepatic coma: Secondary | ICD-10-CM | POA: Diagnosis present

## 2015-06-10 DIAGNOSIS — K7682 Hepatic encephalopathy: Secondary | ICD-10-CM | POA: Diagnosis present

## 2015-06-10 DIAGNOSIS — Z7984 Long term (current) use of oral hypoglycemic drugs: Secondary | ICD-10-CM

## 2015-06-10 DIAGNOSIS — K219 Gastro-esophageal reflux disease without esophagitis: Secondary | ICD-10-CM | POA: Diagnosis present

## 2015-06-10 LAB — CBC
HCT: 39.1 % (ref 35.0–47.0)
Hemoglobin: 13.1 g/dL (ref 12.0–16.0)
MCH: 32.1 pg (ref 26.0–34.0)
MCHC: 33.4 g/dL (ref 32.0–36.0)
MCV: 96 fL (ref 80.0–100.0)
PLATELETS: 118 10*3/uL — AB (ref 150–440)
RBC: 4.08 MIL/uL (ref 3.80–5.20)
RDW: 15.1 % — AB (ref 11.5–14.5)
WBC: 4.7 10*3/uL (ref 3.6–11.0)

## 2015-06-10 LAB — COMPREHENSIVE METABOLIC PANEL
ALT: 24 U/L (ref 14–54)
AST: 50 U/L — AB (ref 15–41)
Albumin: 3 g/dL — ABNORMAL LOW (ref 3.5–5.0)
Alkaline Phosphatase: 142 U/L — ABNORMAL HIGH (ref 38–126)
Anion gap: 7 (ref 5–15)
BILIRUBIN TOTAL: 2.3 mg/dL — AB (ref 0.3–1.2)
BUN: 12 mg/dL (ref 6–20)
CO2: 25 mmol/L (ref 22–32)
CREATININE: 0.85 mg/dL (ref 0.44–1.00)
Calcium: 9.2 mg/dL (ref 8.9–10.3)
Chloride: 108 mmol/L (ref 101–111)
GFR calc Af Amer: >60 mL/min (ref >60)
Glucose, Bld: 135 mg/dL — ABNORMAL HIGH (ref 65–99)
Potassium: 3.9 mmol/L (ref 3.5–5.1)
Sodium: 140 mmol/L (ref 135–145)
TOTAL PROTEIN: 8.2 g/dL — AB (ref 6.5–8.1)

## 2015-06-10 LAB — URINALYSIS COMPLETE WITH MICROSCOPIC (ARMC ONLY)
Bilirubin Urine: NEGATIVE
Glucose, UA: NEGATIVE mg/dL
HGB URINE DIPSTICK: NEGATIVE
Leukocytes, UA: NEGATIVE
Nitrite: NEGATIVE
PH: 5 (ref 5.0–8.0)
Protein, ur: NEGATIVE mg/dL
Specific Gravity, Urine: 1.014 (ref 1.005–1.030)

## 2015-06-10 LAB — AMMONIA: AMMONIA: 52 umol/L — AB (ref 9–35)

## 2015-06-10 MED ORDER — VITAMIN D (ERGOCALCIFEROL) 1.25 MG (50000 UNIT) PO CAPS
50000.0000 [IU] | ORAL_CAPSULE | ORAL | Status: DC
  Administered 2015-06-10: 50000 [IU] via ORAL
  Filled 2015-06-10: qty 1

## 2015-06-10 MED ORDER — NICOTINE 21 MG/24HR TD PT24
21.0000 mg | MEDICATED_PATCH | Freq: Every day | TRANSDERMAL | Status: DC
  Administered 2015-06-10 – 2015-06-13 (×4): 21 mg via TRANSDERMAL
  Filled 2015-06-10 (×4): qty 1

## 2015-06-10 MED ORDER — ONDANSETRON HCL 4 MG PO TABS: 4.0000 mg | ORAL_TABLET | Freq: Four times a day (QID) | ORAL | Status: DC | PRN

## 2015-06-10 MED ORDER — ACETAMINOPHEN 325 MG PO TABS: 650.0000 mg | ORAL_TABLET | Freq: Four times a day (QID) | ORAL | Status: DC | PRN

## 2015-06-10 MED ORDER — RIFAXIMIN 550 MG PO TABS
550.0000 mg | ORAL_TABLET | Freq: Two times a day (BID) | ORAL | Status: DC
  Administered 2015-06-10 – 2015-06-13 (×6): 550 mg via ORAL
  Filled 2015-06-10 (×6): qty 1

## 2015-06-10 MED ORDER — ADULT MULTIVITAMIN W/MINERALS CH
1.0000 | ORAL_TABLET | Freq: Every day | ORAL | Status: DC
  Administered 2015-06-10 – 2015-06-13 (×4): 1 via ORAL
  Filled 2015-06-10 (×4): qty 1

## 2015-06-10 MED ORDER — POTASSIUM CHLORIDE CRYS ER 10 MEQ PO TBCR
10.0000 meq | EXTENDED_RELEASE_TABLET | Freq: Every day | ORAL | Status: DC
  Administered 2015-06-10 – 2015-06-12 (×3): 10 meq via ORAL
  Filled 2015-06-10 (×4): qty 1

## 2015-06-10 MED ORDER — MORPHINE SULFATE (PF) 2 MG/ML IV SOLN: 2.0000 mg | INTRAVENOUS | Status: DC | PRN

## 2015-06-10 MED ORDER — LACTULOSE 10 GM/15ML PO SOLN
30.0000 g | Freq: Three times a day (TID) | ORAL | Status: DC
  Administered 2015-06-10 – 2015-06-13 (×8): 30 g via ORAL
  Filled 2015-06-10 (×8): qty 60

## 2015-06-10 MED ORDER — ONDANSETRON HCL 4 MG/2ML IJ SOLN: 4.0000 mg | Freq: Four times a day (QID) | INTRAMUSCULAR | Status: DC | PRN

## 2015-06-10 MED ORDER — PANTOPRAZOLE SODIUM 40 MG PO TBEC
40.0000 mg | DELAYED_RELEASE_TABLET | Freq: Every day | ORAL | Status: DC
  Administered 2015-06-10 – 2015-06-13 (×4): 40 mg via ORAL
  Filled 2015-06-10 (×5): qty 1

## 2015-06-10 MED ORDER — ENOXAPARIN SODIUM 40 MG/0.4ML ~~LOC~~ SOLN
40.0000 mg | SUBCUTANEOUS | Status: DC
  Administered 2015-06-10 – 2015-06-12 (×3): 40 mg via SUBCUTANEOUS
  Filled 2015-06-10 (×3): qty 0.4

## 2015-06-10 MED ORDER — ACETAMINOPHEN 650 MG RE SUPP: 650.0000 mg | Freq: Four times a day (QID) | RECTAL | Status: DC | PRN

## 2015-06-10 MED ORDER — VITAMIN B-12 100 MCG PO TABS
500.0000 ug | ORAL_TABLET | Freq: Every day | ORAL | Status: DC
  Administered 2015-06-10 – 2015-06-13 (×4): 500 ug via ORAL
  Filled 2015-06-10 (×4): qty 5

## 2015-06-10 MED ORDER — SODIUM CHLORIDE 0.9 % IV BOLUS (SEPSIS)
1000.0000 mL | Freq: Once | INTRAVENOUS | Status: AC
  Administered 2015-06-10: 1000 mL via INTRAVENOUS

## 2015-06-10 MED ORDER — OXYCODONE HCL 5 MG PO TABS: 5.0000 mg | ORAL_TABLET | ORAL | Status: DC | PRN

## 2015-06-10 MED ORDER — NADOLOL 20 MG PO TABS
20.0000 mg | ORAL_TABLET | Freq: Every day | ORAL | Status: DC
  Administered 2015-06-10 – 2015-06-12 (×3): 20 mg via ORAL
  Filled 2015-06-10 (×3): qty 1

## 2015-06-10 MED ORDER — SPIRONOLACTONE 25 MG PO TABS
25.0000 mg | ORAL_TABLET | Freq: Every day | ORAL | Status: DC
Start: 2015-06-10 — End: 2015-06-13
  Administered 2015-06-10 – 2015-06-12 (×3): 25 mg via ORAL
  Filled 2015-06-10 (×3): qty 1

## 2015-06-10 MED ORDER — FUROSEMIDE 40 MG PO TABS
20.0000 mg | ORAL_TABLET | Freq: Every day | ORAL | Status: DC
  Administered 2015-06-10 – 2015-06-13 (×4): 20 mg via ORAL
  Filled 2015-06-10 (×4): qty 1

## 2015-06-10 MED ORDER — METFORMIN HCL ER 500 MG PO TB24
1000.0000 mg | ORAL_TABLET | Freq: Every day | ORAL | Status: DC
  Administered 2015-06-11 – 2015-06-13 (×3): 1000 mg via ORAL
  Filled 2015-06-10 (×4): qty 2

## 2015-06-10 NOTE — ED Provider Notes (Signed)
[facility] Emergency Department Provider Note  ____________________________________________  Time seen: Approximately 4:39 PM  I have reviewed the triage vital signs and the nursing notes.   HISTORY  Chief Complaint Altered Mental Status  Patient confused and disoriented, unable to give clear history. EM caveat  HPI Gloria Cole is a 70 y.o. female the previous history of hepatitis, diabetes, encephalopathy.  Patient presents today with her brother who provides history.  Patient's brother reports that since about Tuesday his sister's been seemingly slightly more confused, CBC and a chair and just kind of staring out into space. Reports his symptoms slowly seem to be worsening, she'll just sit staring at the wall. Today she was too weak to get up out of bed didn't eat and didn't take any of her medicine. They saw her doctor today who was concerned she had may have a high ammonia level and was sent to the ER for evaluation.  Brother states he has not noticed any fever, stop the complaining of pain, he has not noticed any trouble with weakness on one side or the other but he thinks that she's just progressively becoming more weak and confused and states she's had similar problems where she had to be admitted due to her ammonia level.   Past Medical History  Diagnosis Date  . Esophageal varices ([place]) March 2014  . Diabetes ([place]) 2006  . History of hepatitis C   . History of encephalopathy   . Hypertension   . Hyperlipidemia associated with type 2 diabetes mellitus ([place])   . GERD (gastroesophageal reflux disease)   . Cirrhosis ([place])   . Hepatitis   . Portal hypertensive gastropathy     Patient Active Problem List   Diagnosis Date Noted  . Acute hepatic encephalopathy ([place]) 11/18/2014  . DM (diabetes mellitus) ([place]) 11/18/2014  . HTN (hypertension) 11/18/2014  . Nausea and vomiting 07/03/2012  . Gallstones 06/09/2012  . Hepatic cirrhosis  ([place]) 06/09/2012    Past Surgical History  Procedure Laterality Date  . Abdominal hysterectomy  1994  . Colonoscopy  2010  . Upper gi endoscopy  2014  . Ercp  April 2014    Dr Candace Cruise  . Cholecystectomy  April 2014    Dr Bary Castilla  . Esophagogastroduodenoscopy (egd) with propofol N/A 10/07/2014    Procedure: ESOPHAGOGASTRODUODENOSCOPY (EGD) WITH PROPOFOL;  Surgeon: Hulen Luster, MD;  Location: [facility] ENDOSCOPY;  Service: Gastroenterology;  Laterality: N/A;  . Esophagogastroduodenoscopy (egd) with propofol N/A 12/16/2014    Procedure: ESOPHAGOGASTRODUODENOSCOPY (EGD) WITH PROPOFOL;  Surgeon: Hulen Luster, MD;  Location: [facility] ENDOSCOPY;  Service: Gastroenterology;  Laterality: N/A;  . Breast biopsy Right 1995    neg  . Esophagogastroduodenoscopy (egd) with propofol N/A 03/04/2015    Procedure: ESOPHAGOGASTRODUODENOSCOPY (EGD) WITH PROPOFOL;  Surgeon: Hulen Luster, MD;  Location: [facility] ENDOSCOPY;  Service: Gastroenterology;  Laterality: N/A;  . Colonoscopy with propofol N/A 03/04/2015    Procedure: COLONOSCOPY WITH PROPOFOL;  Surgeon: Hulen Luster, MD;  Location: [facility] ENDOSCOPY;  Service: Gastroenterology;  Laterality: N/A;    Current Outpatient Rx  Name  Route  Sig  Dispense  Refill  . Cyanocobalamin (VITAMIN B 12 PO)   Oral   Take by mouth daily.         Marland Kitchen lactulose (CHRONULAC) 10 GM/15ML solution   Oral   Take 45 mLs (30 g total) by mouth 3 (three) times daily.   946 mL   0   . metFORMIN (GLUCOPHAGE-XR) 500 MG  24 hr tablet   Oral   Take 1,000 mg by mouth daily with breakfast.          . Multiple Vitamin (MULTIVITAMIN) tablet   Oral   Take 1 tablet by mouth daily.         . nadolol (CORGARD) 20 MG tablet   Oral   Take 20 mg by mouth daily.          . ondansetron (ZOFRAN ODT) 8 MG disintegrating tablet   Oral   Take 1 tablet (8 mg total) by mouth every 8 (eight) hours as needed for nausea.   50 tablet   3   . pantoprazole (PROTONIX) 40 MG tablet   Oral   Take 40 mg by mouth  daily.          . potassium citrate (UROCIT-K) 10 MEQ (1080 MG) SR tablet   Oral   Take 2 tablets (20 mEq total) by mouth daily.   30 tablet   0   . pravastatin (PRAVACHOL) 40 MG tablet   Oral   Take 40 mg by mouth daily.         . rifaximin (XIFAXAN) 550 MG TABS tablet   Oral   Take 550 mg by mouth 2 (two) times daily.         Marland Kitchen spironolactone (ALDACTONE) 25 MG tablet   Oral   Take 25 mg by mouth daily.         . Vitamin D, Ergocalciferol, (DRISDOL) 50000 UNITS CAPS capsule   Oral   Take 50,000 Units by mouth every 7 (seven) days.           Allergies Penicillins  Family History  Problem Relation Age of Onset  . Diabetes Sister   . Diabetes Brother     Social History Social History  Substance Use Topics  . Smoking status: Current Some Day Smoker -- 1.00 packs/day for 30 years    Types: Cigarettes  . Smokeless tobacco: Never Used     Comment: 1 pack in 2 weeks  . Alcohol Use: No     Comment: occasionally    Review of Systems Patient is quite weak. She is able to deny pain, but otherwise unable to provide a review of systems.  ____________________________________________   PHYSICAL EXAM:  VITAL SIGNS: ED Triage Vitals  Enc Vitals Group     BP 06/10/15 1228 137/63 mmHg     Pulse Rate 06/10/15 1228 68     Resp 06/10/15 1228 16     Temp 06/10/15 1228 97.6 F (36.4 C)     Temp Source 06/10/15 1228 Oral     SpO2 06/10/15 1233 100 %     Weight 06/10/15 1228 145 lb (65.772 kg)     Height 06/10/15 1228 5\' 5"  (1.651 m)     Head Cir --      Peak Flow --      Pain Score 06/10/15 1232 0     Pain Loc --      Pain Edu? --      Excl. in [place]? --    Constitutional: Somnolent appearing and in no acute distress. Oriented to self, her brother, but reports it's November and does not know the year. Her thought processing seems very slow. Eyes: Conjunctivae are normal. PERRL. EOMI. Head: Atraumatic. Nose: No congestion/rhinnorhea. Mouth/Throat: Mucous  membranes are dry.  Oropharynx non-erythematous. Neck: No stridor.  No meningismus Cardiovascular: Normal rate, regular rhythm. Grossly normal heart sounds.  Good peripheral  circulation. Respiratory: Normal respiratory effort.  No retractions. Lungs CTAB. Gastrointestinal: Soft and nontender. No distention.  Musculoskeletal: No lower extremity tenderness nor edema.  No joint effusions. Neurologic:  Very slow and intentional speech and language. No gross focal neurologic deficits are appreciated, she is able to hold her arms up and has no pronator drift. She is able to move both feet with equal but reduced strength bilateral. Overall approximately 4-5 strength in all extremities.. Skin:  Skin is warm, dry and intact. No rash noted. Psychiatric: Mood and affect are flat and withdrawn.   No focal neurologic deficits are noted. Extraocular movements intact. Unable to assess for asterixis as the patient has trouble following this command.  ____________________________________________   LABS (all labs ordered are listed, but only abnormal results are displayed)  Labs Reviewed  COMPREHENSIVE METABOLIC PANEL - Abnormal; Notable for the following:    Glucose, Bld 135 (*)    Total Protein 8.2 (*)    Albumin 3.0 (*)    AST 50 (*)    Alkaline Phosphatase 142 (*)    Total Bilirubin 2.3 (*)    All other components within normal limits  CBC - Abnormal; Notable for the following:    RDW 15.1 (*)    Platelets 118 (*)    All other components within normal limits  AMMONIA - Abnormal; Notable for the following:    Ammonia 52 (*)    All other components within normal limits  URINALYSIS COMPLETEWITH MICROSCOPIC ([facility] ONLY)  CBG MONITORING, ED   ____________________________________________  EKG  EKG reviewed and interpreted by me at 1815  ED ECG REPORT I, Domnick Chervenak, the attending physician, personally viewed and interpreted this ECG.  Date: 06/10/2015 EKG Time: 1815 Rate: 65 Rhythm: normal  sinus rhythm QRS Axis: normal Intervals: normal ST/T Wave abnormalities: normal Conduction Disturbances: none Narrative Interpretation: unremarkable   ____________________________________________  RADIOLOGY   DG Chest Port 1 View (Final result) Result time: 06/10/15 17:29:01   Final result by Rad Results In Interface (06/10/15 17:29:01)   Narrative:   CLINICAL DATA: Altered mental status. Initial encounter.  EXAM: PORTABLE CHEST 1 VIEW  COMPARISON: None.  FINDINGS: The lungs are clear. Heart size is normal. No pneumothorax or pleural effusion. Aortic atherosclerosis noted.  IMPRESSION: No acute disease.   Electronically Signed By: Inge Rise M.D. On: 06/10/2015 17:29          CT Head Wo Contrast (Final result) Result time: 06/10/15 17:08:28   Final result by Rad Results In Interface (06/10/15 17:08:28)   Narrative:   CLINICAL DATA: Confusion.  EXAM: CT HEAD WITHOUT CONTRAST  TECHNIQUE: Contiguous axial images were obtained from the base of the skull through the vertex without intravenous contrast.  COMPARISON: 12/30/2012  FINDINGS: No acute cortical infarct, hemorrhage, or mass lesion ispresent. Ventricles are of normal size. No significant extra-axial fluid collection is present. The paranasal sinuses andmastoid air cells are clear. The osseous skull is intact.  IMPRESSION: 1. Normal brain. No acute intracranial abnormalities identified.   Electronically Signed By: Kerby Moors M.D. On: 06/10/2015 17:08          ____________________________________________   PROCEDURES  Procedure(s) performed: None  Critical Care performed: No  ____________________________________________   INITIAL IMPRESSION / ASSESSMENT AND PLAN / ED COURSE  Pertinent labs & imaging results that were available during my care of the patient were reviewed by me and considered in my medical decision making (see chart for  details).  Altered mental status. The patient appears to have  encephalopathic presentation, questionable delirium. She is not febrile and she is not complaining of pain. Her somewhat insidious increasing weakness with her previous history seem to suggest likely hepatic encephalopathy, there is no evidence of acute cardiac or pulmonary abnormality based on her clinical exam or history. I will obtain further evaluation including CT of the head to rule out stroke. Also evaluate for signs or symptoms of infection such as urinalysis/chest x-ray.  ----------------------------------------- 6:20 PM on 06/10/2015 ----------------------------------------- Labs reviewed, CT and chest x-ray reviewed. Ammonia slightly elevated and given presentation favor likely hepatic encephalopathy. No evidence of acute neurologic process. Plan to admit the patient, hydrate, and further disposition and treatment per the hospitalist service. Urinalysis is pending.  ____________________________________________   FINAL CLINICAL IMPRESSION(S) / ED DIAGNOSES  Final diagnoses:  Hepatic encephalopathy (HCC)      Delman Kitten, MD 06/10/15 1821

## 2015-06-10 NOTE — ED Notes (Signed)
Patient's brother states patient is confused.  Onset of symptoms Tuesday.  Went to see PCP today and sent patient to ED for evaluation for possible elevated ammonia.  Patient has liver disease and ammonia has been elevated in the past, last a few months ago.  Patient is Awake and alert.  Confused to time.

## 2015-06-10 NOTE — H&P (Signed)
[place] [facility] [place] NAME: Gloria Cole    MR#:  [id number]  DATE OF BIRTH:  [birth year]  DATE OF ADMISSION:  06/10/2015  PRIMARY CARE PHYSICIAN: Kirk Ruths., MD   REQUESTING/REFERRING PHYSICIAN: Dr. Delman Kitten  CHIEF COMPLAINT:   Chief Complaint  Patient presents with  . Altered Mental Status    HISTORY OF PRESENT ILLNESS:  Gloria Cole  is a 70 y.o. female with a known history of hepatitis C with liver cirrhosis, esophageal varices and prior history of hepatic encephalopathy, diabetes presents to the hospital secondary to weakness and confusion. Patient is slow to respond and oriented to self at this time. Brother at bedside White's most of the history. Patient had a similar admission last year for hepatic encephalopathy even though her ammonia was borderline elevated at the time. 2-3 days into the hospitalization, she was more alert and was discharged home. Now her ammonia is elevated at 52.  Patient's nephew checked on her 3 days ago and she was noted to be confused and slow to respond. It got worse over the last couple of days and she went to see her PCP and they have referred her to the emergency room for possible hepatic encephalopathy. At baseline patient is alert oriented and in independent. She say she has been compliant with her medications. Denies any fevers or chills. No nausea vomiting or constipation. Has diarrhea from her lactulose. Also has chronic abdominal pain.  PAST MEDICAL HISTORY:   Past Medical History  Diagnosis Date  . Esophageal varices ([place]) March 2014  . Diabetes ([place]) 2006  . History of hepatitis C   . History of encephalopathy   . Hypertension   . Hyperlipidemia associated with type 2 diabetes mellitus ([place])   . GERD (gastroesophageal reflux disease)   . Cirrhosis ([place])   . Hepatitis   . Portal hypertensive gastropathy     PAST SURGICAL HISTORY:   Past Surgical History  Procedure  Laterality Date  . Abdominal hysterectomy  1994  . Colonoscopy  2010  . Upper gi endoscopy  2014  . Ercp  April 2014    Dr Candace Cruise  . Cholecystectomy  April 2014    Dr Bary Castilla  . Esophagogastroduodenoscopy (egd) with propofol N/A 10/07/2014    Procedure: ESOPHAGOGASTRODUODENOSCOPY (EGD) WITH PROPOFOL;  Surgeon: Hulen Luster, MD;  Location: [facility] ENDOSCOPY;  Service: Gastroenterology;  Laterality: N/A;  . Esophagogastroduodenoscopy (egd) with propofol N/A 12/16/2014    Procedure: ESOPHAGOGASTRODUODENOSCOPY (EGD) WITH PROPOFOL;  Surgeon: Hulen Luster, MD;  Location: [facility] ENDOSCOPY;  Service: Gastroenterology;  Laterality: N/A;  . Breast biopsy Right 1995    neg  . Esophagogastroduodenoscopy (egd) with propofol N/A 03/04/2015    Procedure: ESOPHAGOGASTRODUODENOSCOPY (EGD) WITH PROPOFOL;  Surgeon: Hulen Luster, MD;  Location: [facility] ENDOSCOPY;  Service: Gastroenterology;  Laterality: N/A;  . Colonoscopy with propofol N/A 03/04/2015    Procedure: COLONOSCOPY WITH PROPOFOL;  Surgeon: Hulen Luster, MD;  Location: [facility] ENDOSCOPY;  Service: Gastroenterology;  Laterality: N/A;    SOCIAL HISTORY:   Social History  Substance Use Topics  . Smoking status: Current Some Day Smoker -- 1.00 packs/day for 30 years    Types: Cigarettes  . Smokeless tobacco: Never Used     Comment: 1 pack in 2 days  . Alcohol Use: No     Comment: occasionally    FAMILY HISTORY:   Family History  Problem Relation Age of Onset  . Diabetes Sister   .  Diabetes Brother     DRUG ALLERGIES:   Allergies  Allergen Reactions  . Penicillins Swelling and Other (See Comments)    Reaction:  Facial swelling  Has patient had a PCN reaction causing immediate rash, facial/tongue/throat swelling, SOB or lightheadedness with hypotension: Yes Has patient had a PCN reaction causing severe rash involving mucus membranes or skin necrosis: No Has patient had a PCN reaction that required hospitalization No Has patient had a PCN reaction occurring  within the last 10 years: No If all of the above answers are "NO", then may proceed with Cephalosporin use.    REVIEW OF SYSTEMS:   Review of Systems  Unable to perform ROS: mental status change    MEDICATIONS AT HOME:   Prior to Admission medications   Medication Sig Start Date End Date Taking? Authorizing Provider  cyanocobalamin 500 MCG tablet Take 500 mcg by mouth daily.   Yes Historical Provider, MD  furosemide (LASIX) 20 MG tablet Take 20 mg by mouth daily.   Yes Historical Provider, MD  lactulose (CHRONULAC) 10 GM/15ML solution Take 45 mLs (30 g total) by mouth 3 (three) times daily. 11/19/14  Yes Srikar Sudini, MD  metFORMIN (GLUCOPHAGE-XR) 500 MG 24 hr tablet Take 1,000 mg by mouth daily with breakfast.    Yes Historical Provider, MD  Multiple Vitamin (MULTIVITAMIN WITH MINERALS) TABS tablet Take 1 tablet by mouth daily.   Yes Historical Provider, MD  nadolol (CORGARD) 20 MG tablet Take 20 mg by mouth daily.    Yes Historical Provider, MD  ondansetron (ZOFRAN-ODT) 4 MG disintegrating tablet Take 4 mg by mouth every 8 (eight) hours as needed for nausea or vomiting.   Yes Historical Provider, MD  pantoprazole (PROTONIX) 40 MG tablet Take 40 mg by mouth daily.    Yes Historical Provider, MD  potassium chloride (K-DUR,KLOR-CON) 10 MEQ tablet Take 10 mEq by mouth daily.   Yes Historical Provider, MD  pravastatin (PRAVACHOL) 40 MG tablet Take 40 mg by mouth at bedtime.    Yes Historical Provider, MD  promethazine (PHENERGAN) 25 MG tablet Take 25 mg by mouth every 4 (four) hours as needed for nausea or vomiting.   Yes Historical Provider, MD  rifaximin (XIFAXAN) 550 MG TABS tablet Take 550 mg by mouth 2 (two) times daily.   Yes Historical Provider, MD  spironolactone (ALDACTONE) 25 MG tablet Take 25 mg by mouth daily.   Yes Historical Provider, MD  Vitamin D, Ergocalciferol, (DRISDOL) 50000 UNITS CAPS capsule Take 50,000 Units by mouth every 7 (seven) days. Pt takes on Thursday.   Yes  Historical Provider, MD      VITAL SIGNS:  Blood pressure 140/74, pulse 72, temperature 97.6 F (36.4 C), temperature source Oral, resp. rate 9, height 5\' 5"  (1.651 m), weight 65.772 kg (145 lb), SpO2 98 %.  PHYSICAL EXAMINATION:   Physical Exam  GENERAL:  70 y.o.-year-old patient lying in the bed with no acute distress.  EYES: Pupils equal, round, reactive to light and accommodation. No scleral icterus. Extraocular muscles intact.  HEENT: Head atraumatic, normocephalic. Oropharynx and nasopharynx clear.  NECK:  Supple, no jugular venous distention. No thyroid enlargement, no tenderness.  LUNGS: Normal breath sounds bilaterally, no wheezing, rales,rhonchi or crepitation. No use of accessory muscles of respiration.  CARDIOVASCULAR: S1, S2 normal. No murmurs, rubs, or gallops.  ABDOMEN: Soft, mild tenderness noted in epigastric, right upper quadrant and left upper quadrants, nondistended. Bowel sounds present. No organomegaly or mass.  EXTREMITIES: No pedal edema, cyanosis, or  clubbing.  NEUROLOGIC: Cranial nerves are intact. She is following commands and able to go all 4 extremities. The incision is intact as well. Unable to do a complete neuro exam due to her confusion. PSYCHIATRIC: The patient is alert and oriented to self.  SKIN: No obvious rash, lesion, or ulcer.   LABORATORY PANEL:   CBC  Recent Labs Lab 06/10/15 1235  WBC 4.7  HGB 13.1  HCT 39.1  PLT 118*   ------------------------------------------------------------------------------------------------------------------  Chemistries   Recent Labs Lab 06/10/15 1235  NA 140  K 3.9  CL 108  CO2 25  GLUCOSE 135*  BUN 12  CREATININE 0.85  CALCIUM 9.2  AST 50*  ALT 24  ALKPHOS 142*  BILITOT 2.3*   ------------------------------------------------------------------------------------------------------------------  Cardiac Enzymes No results for input(s): TROPONINI in the last 168  hours. ------------------------------------------------------------------------------------------------------------------  RADIOLOGY:  Ct Head Wo Contrast  06/10/2015  CLINICAL DATA:  Confusion. EXAM: CT HEAD WITHOUT CONTRAST TECHNIQUE: Contiguous axial images were obtained from the base of the skull through the vertex without intravenous contrast. COMPARISON:  12/30/2012 FINDINGS: No acute cortical infarct, hemorrhage, or mass lesion ispresent. Ventricles are of normal size. No significant extra-axial fluid collection is present. The paranasal sinuses andmastoid air cells are clear. The osseous skull is intact. IMPRESSION: 1. Normal brain.  No acute intracranial abnormalities identified. Electronically Signed   By: Kerby Moors M.D.   On: 06/10/2015 17:08   Dg Chest Port 1 View  06/10/2015  CLINICAL DATA:  Altered mental status.  Initial encounter. EXAM: PORTABLE CHEST 1 VIEW COMPARISON:  None. FINDINGS: The lungs are clear. Heart size is normal. No pneumothorax or pleural effusion. Aortic atherosclerosis noted. IMPRESSION: No acute disease. Electronically Signed   By: Inge Rise M.D.   On: 06/10/2015 17:29    EKG:   Orders placed or performed during the hospital encounter of 06/10/15  . EKG 12-Lead  . EKG 12-Lead  . EKG 12-Lead  . EKG 12-Lead  . EKG 12-Lead  . EKG 12-Lead    IMPRESSION AND PLAN:   Payslie Sharrard  is a 70 y.o. female with a known history of hepatitis C with liver cirrhosis, esophageal varices and prior history of hepatic encephalopathy, diabetes presents to the hospital secondary to weakness and confusion.  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 Ammonia is elevated. -Continue lactulose and rifaximin. -Recheck ammonia in a.m. Continue to monitor her mental status. -CT of the head negative for any acute findings. Urinalysis negative for infection. -If not improving, will consult GI  #2 liver cirrhosis-secondary to hepatitis C. Following with  GI as outpatient. -Continue rifaximin, lactulose. Also on  Lasix, Aldactone and nadolol  #3 GERD-continue Protonix  #4 diabetes mellitus-on metformin. Check A1c  #5 tobacco use disorder-started on nicotine patch  #6 DVT prophylaxis-on Lovenox    All the records are reviewed and case discussed with ED provider. Management plans discussed with the patient, family and they are in agreement.  CODE STATUS: Full Code  TOTAL TIME TAKING CARE OF THIS PATIENT: 50 minutes.    Gloria Cole M.D on 06/10/2015 at 7:05 PM  Between 7am to 6pm - Pager - [phone]  After 6pm go to [email or web address] - password [email or web address] [place] [facility]  Office  [phone]  CC: Primary care physician; Kirk Ruths., MD

## 2015-06-11 LAB — BASIC METABOLIC PANEL
Anion gap: 6 (ref 5–15)
BUN: 10 mg/dL (ref 6–20)
CHLORIDE: 111 mmol/L (ref 101–111)
CO2: 23 mmol/L (ref 22–32)
Calcium: 8.6 mg/dL — ABNORMAL LOW (ref 8.9–10.3)
Creatinine, Ser: 0.83 mg/dL (ref 0.44–1.00)
GFR calc Af Amer: >60 mL/min (ref >60)
Glucose, Bld: 190 mg/dL — ABNORMAL HIGH (ref 65–99)
POTASSIUM: 3.2 mmol/L — AB (ref 3.5–5.1)
SODIUM: 140 mmol/L (ref 135–145)

## 2015-06-11 LAB — HEMOGLOBIN A1C: HEMOGLOBIN A1C: 7.8 % — AB (ref 4.0–6.0)

## 2015-06-11 LAB — CBC
HEMATOCRIT: 34.3 % — AB (ref 35.0–47.0)
HEMOGLOBIN: 11.5 g/dL — AB (ref 12.0–16.0)
MCH: 32 pg (ref 26.0–34.0)
MCHC: 33.5 g/dL (ref 32.0–36.0)
MCV: 95.7 fL (ref 80.0–100.0)
Platelets: 105 10*3/uL — ABNORMAL LOW (ref 150–440)
RBC: 3.58 MIL/uL — AB (ref 3.80–5.20)
RDW: 15.1 % — ABNORMAL HIGH (ref 11.5–14.5)
WBC: 5.3 10*3/uL (ref 3.6–11.0)

## 2015-06-11 LAB — AMMONIA: Ammonia: 72 umol/L — ABNORMAL HIGH (ref 9–35)

## 2015-06-11 MED ORDER — GLUCERNA SHAKE PO LIQD
237.0000 mL | Freq: Two times a day (BID) | ORAL | Status: DC
  Administered 2015-06-12 (×2): 237 mL via ORAL

## 2015-06-11 MED ORDER — POTASSIUM CHLORIDE CRYS ER 20 MEQ PO TBCR
40.0000 meq | EXTENDED_RELEASE_TABLET | Freq: Once | ORAL | Status: AC
  Administered 2015-06-11: 40 meq via ORAL
  Filled 2015-06-11 (×2): qty 2

## 2015-06-11 NOTE — Progress Notes (Signed)
[facility] [place] NAME: Gloria Cole    MR#:  [id number]  DATE OF BIRTH:  10-Apr-1945  SUBJECTIVE:  CHIEF COMPLAINT:   Chief Complaint  Patient presents with  . Altered Mental Status   - patient with hepatitis C and liver cirrhosis admitted with confusion - still confused, ammonia remains elevated. Alert and following simple commands, but not oriented.  REVIEW OF SYSTEMS:  Review of Systems  Unable to perform ROS: mental status change    DRUG ALLERGIES:   Allergies  Allergen Reactions  . Penicillins Swelling and Other (See Comments)    Reaction:  Facial swelling  Has patient had a PCN reaction causing immediate rash, facial/tongue/throat swelling, SOB or lightheadedness with hypotension: Yes Has patient had a PCN reaction causing severe rash involving mucus membranes or skin necrosis: No Has patient had a PCN reaction that required hospitalization No Has patient had a PCN reaction occurring within the last 10 years: No If all of the above answers are "NO", then may proceed with Cephalosporin use.    VITALS:  Blood pressure 108/50, pulse 65, temperature 98 F (36.7 C), temperature source Oral, resp. rate 16, height 5\' 5"  (1.651 m), weight 64.184 kg (141 lb 8 oz), SpO2 100 %.  PHYSICAL EXAMINATION:  Physical Exam  GENERAL: 70 y.o.-year-old patient lying in the bed with no acute distress.  EYES: Pupils equal, round, reactive to light and accommodation. No scleral icterus. Extraocular muscles intact.  HEENT: Head atraumatic, normocephalic. Oropharynx and nasopharynx clear.  NECK: Supple, no jugular venous distention. No thyroid enlargement, no tenderness.  LUNGS: Normal breath sounds bilaterally, no wheezing, rales,rhonchi or crepitation. No use of accessory muscles of respiration.  CARDIOVASCULAR: S1, S2 normal. No murmurs, rubs, or gallops.  ABDOMEN: Soft, mild tenderness noted in epigastric, right upper  quadrant and left upper quadrants, nondistended. Bowel sounds present. No organomegaly or mass.  EXTREMITIES: No pedal edema, cyanosis, or clubbing.  NEUROLOGIC: Cranial nerves are intact. She is following commands and able to go all 4 extremities. The incision is intact as well. Unable to do a complete neuro exam due to her confusion. PSYCHIATRIC: The patient is alert and oriented to self.  SKIN: No obvious rash, lesion, or ulcer.    LABORATORY PANEL:   CBC  Recent Labs Lab 06/11/15 0510  WBC 5.3  HGB 11.5*  HCT 34.3*  PLT 105*   ------------------------------------------------------------------------------------------------------------------  Chemistries   Recent Labs Lab 06/10/15 1235 06/11/15 0510  NA 140 140  K 3.9 3.2*  CL 108 111  CO2 25 23  GLUCOSE 135* 190*  BUN 12 10  CREATININE 0.85 0.83  CALCIUM 9.2 8.6*  AST 50*  --   ALT 24  --   ALKPHOS 142*  --   BILITOT 2.3*  --    ------------------------------------------------------------------------------------------------------------------  Cardiac Enzymes No results for input(s): TROPONINI in the last 168 hours. ------------------------------------------------------------------------------------------------------------------  RADIOLOGY:  Ct Head Wo Contrast  06/10/2015  CLINICAL DATA:  Confusion. EXAM: CT HEAD WITHOUT CONTRAST TECHNIQUE: Contiguous axial images were obtained from the base of the skull through the vertex without intravenous contrast. COMPARISON:  12/30/2012 FINDINGS: No acute cortical infarct, hemorrhage, or mass lesion ispresent. Ventricles are of normal size. No significant extra-axial fluid collection is present. The paranasal sinuses andmastoid air cells are clear. The osseous skull is intact. IMPRESSION: 1. Normal brain.  No acute intracranial abnormalities identified. Electronically Signed   By: Kerby Moors M.D.   On: 06/10/2015 17:08  Dg Chest Port 1 View  06/10/2015  CLINICAL  DATA:  Altered mental status.  Initial encounter. EXAM: PORTABLE CHEST 1 VIEW COMPARISON:  None. FINDINGS: The lungs are clear. Heart size is normal. No pneumothorax or pleural effusion. Aortic atherosclerosis noted. IMPRESSION: No acute disease. Electronically Signed   By: Inge Rise M.D.   On: 06/10/2015 17:29    EKG:   Orders placed or performed during the hospital encounter of 06/10/15  . EKG 12-Lead  . EKG 12-Lead  . EKG 12-Lead  . EKG 12-Lead  . EKG 12-Lead  . EKG 12-Lead    ASSESSMENT AND PLAN:   Gloria Cole is a 70 y.o. female with a known history of hepatitis C with liver cirrhosis, esophageal varices and prior history of hepatic encephalopathy, diabetes presents to the hospital secondary to weakness and confusion.  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 Ammonia is elevated. Still at 70 today. -Continue lactulose and rifaximin. -Continue to monitor her mental status. -CT of the head negative for any acute findings. Urinalysis negative for infection. -If not improving, will consult GI  #2 liver cirrhosis-secondary to hepatitis C. Following with GI as outpatient. -Continue rifaximin, lactulose. Also on Lasix, Aldactone and nadolol  #3 GERD-continue Protonix  #4 diabetes mellitus- on metformin. Check A1c  #5 tobacco use disorder-started on nicotine patch  #6 DVT prophylaxis-on Lovenox  #7 hypokalemia- being replaced     All the records are reviewed and case discussed with Care Management/Social Workerr. Management plans discussed with the patient, family and they are in agreement.  CODE STATUS: full code  TOTAL TIME TAKING CARE OF THIS PATIENT: 37 minutes.   POSSIBLE D/C IN 1-2 DAYS, DEPENDING ON CLINICAL CONDITION.   Gladstone Lighter M.D on 06/11/2015 at 8:54 AM  Between 7am to 6pm - Pager - [phone]  After 6pm go to [email or web address] - password [email or web address] [facility]  Office  [phone]  CC: Primary  care physician; Kirk Ruths., MD

## 2015-06-11 NOTE — Progress Notes (Signed)
Initial Nutrition Assessment  DOCUMENTATION CODES:   Not applicable  INTERVENTION:   -Cater to pt preferences on 2g Na diet order. Pt may benefit from Carb Modified diet order when intake improved. -Recommend Glucerna Shake po BID, each supplement provides 220 kcal and 10 grams of protein   NUTRITION DIAGNOSIS:   Inadequate oral intake related to poor appetite as evidenced by per patient/family report (per MST on admission).  GOAL:   Patient will meet greater than or equal to 90% of their needs  MONITOR:   PO intake, Supplement acceptance, Labs, Weight trends, I & O's  REASON FOR ASSESSMENT:   Malnutrition Screening Tool    ASSESSMENT:   Pt admitted with confusion and elevated ammonia secondary to hepatic encephalopathy. Pt with h/o hepatitis C and cirrhosis.   Past Medical History  Diagnosis Date  . Esophageal varices ([place]) March 2014  . Diabetes ([place]) 2006  . History of hepatitis C   . History of encephalopathy   . Hypertension   . Hyperlipidemia associated with type 2 diabetes mellitus ([place])   . GERD (gastroesophageal reflux disease)   . Cirrhosis ([place])   . Hepatitis   . Portal hypertensive gastropathy     Diet Order:  Diet 2 gram sodium Room service appropriate?: Yes; Fluid consistency:: Thin  Pt confused on visit, unable to use call button for nurse aid so RD called so pt could be assisted to use bathroom. Pt had breakfast tray in front of her at chair.  Pt had eaten roughly 50%. Pt unable to clarify appetite trend secondary to confusion. Per MST decreased appetite PTA.  Medications: Lasix, Lactulose, Metformin, MVI KCl, Rifaxim, Protonix, Vitamin B12 and D Labs: Ammonia 72, K 3.2 (supplemented), Glucose 190   Gastrointestinal Profile: Last BM:  06/11/2015   Nutrition-Focused Physical Exam Findings:  Unable to complete Nutrition-Focused physical exam at this time.    Weight Change: Per CHL weight trend, pt with 5% weight loss in 4 months.   Skin:   Reviewed, no issues   Height:   Ht Readings from Last 1 Encounters:  06/10/15 5\' 5"  (1.651 m)    Weight:   Wt Readings from Last 1 Encounters:  06/10/15 141 lb 8 oz (64.184 kg)   Wt Readings from Last 10 Encounters:  06/10/15 141 lb 8 oz (64.184 kg)  03/17/15 149 lb 14.4 oz (67.994 kg)  03/04/15 145 lb (65.772 kg)  12/16/14 145 lb (65.772 kg)  11/19/14 138 lb 14.4 oz (63.005 kg)  10/07/14 148 lb (67.132 kg)  07/03/12 156 lb (70.761 kg)  06/09/12 159 lb (72.122 kg)  05/14/12 166 lb (75.297 kg)    BMI:  Body mass index is 23.55 kg/(m^2).  Estimated Nutritional Needs:   Kcal:  1600-1920kcals  Protein:  64-76g protein  Fluid:  >1.7L fluid  EDUCATION NEEDS:   No education needs identified at this time   Dwyane Luo, RD, LDN Pager [phone] Weekend/On-Call Pager [phone]

## 2015-06-11 NOTE — Evaluation (Signed)
Physical Therapy Evaluation Patient Details Name: Gloria Cole MRN: [id number] DOB: [birth year] Today's Date: 06/11/2015   History of Present Illness  70 yo female with significant AMS, has elevated ammonia from hep c, encephalopathy.  Has no AD used at home and lives alone.  PMHx:  DM, hep c  Clinical Impression  Pt is getting up to walk with PT and will benefit from use of RW as she is more alert and capable of doing more.  Chair alarm in place and will see her with consistency until dc to SNF.  Focus on standing and gait to increase independence    Follow Up Recommendations SNF    Equipment Recommendations  Rolling walker with 5" wheels    Recommendations for Other Services Rehab consult     Precautions / Restrictions Precautions Precautions: Fall (telemetry) Restrictions Weight Bearing Restrictions: No      Mobility  Bed Mobility Overal bed mobility: Needs Assistance Bed Mobility: Supine to Sit     Supine to sit: Mod assist     General bed mobility comments: help to pivot with trunk and legs  Transfers Overall transfer level: Needs assistance Equipment used: 1 person hand held assist Transfers: Sit to/from Stand;Stand Pivot Transfers Sit to Stand: Mod assist Stand pivot transfers: Mod assist       General transfer comment: pt was too lethargic for walker and used contact to prompt safety  Ambulation/Gait Ambulation/Gait assistance: Mod assist;Min assist Ambulation Distance (Feet): 3 Feet Assistive device: 1 person hand held assist Gait Pattern/deviations: Step-through pattern;Step-to pattern;Trunk flexed;Wide base of support Gait velocity: reduced Gait velocity interpretation: Below normal speed for age/gender General Gait Details: sidesteps to the chair  Stairs            Wheelchair Mobility    Modified Rankin (Stroke Patients Only)       Balance Overall balance assessment: History of Falls;Needs assistance Sitting-balance  support: Feet supported Sitting balance-Leahy Scale: Poor     Standing balance support: Single extremity supported Standing balance-Leahy Scale: Poor                               Pertinent Vitals/Pain Pain Assessment: No/denies pain    Home Living Family/patient expects to be discharged to:: Private residence Living Arrangements: Other relatives (lives with brother who is available at times) Available Help at Discharge: Family;Available PRN/intermittently Type of Home: House         Home Equipment: None      Prior Function Level of Independence: Independent               Hand Dominance        Extremity/Trunk Assessment   Upper Extremity Assessment: Generalized weakness           Lower Extremity Assessment: Generalized weakness      Cervical / Trunk Assessment: Normal  Communication   Communication: No difficulties  Cognition Arousal/Alertness: Lethargic Behavior During Therapy: Flat affect Overall Cognitive Status: Impaired/Different from baseline Area of Impairment: Memory;Following commands;Safety/judgement;Awareness;Problem solving     Memory: Decreased short-term memory Following Commands: Follows one step commands inconsistently Safety/Judgement: Decreased awareness of safety;Decreased awareness of deficits Awareness: Intellectual Problem Solving: Slow processing;Difficulty sequencing;Requires tactile cues;Requires verbal cues General Comments: dense cues and will not let PT assist her at first    General Comments General comments (skin integrity, edema, etc.): Pt is in need of cues and help to direct mobiltiy and will benefit from short  rehab stay as she is not going to be safe without 24/7 help    Exercises        Assessment/Plan    PT Assessment Patient needs continued PT services  PT Diagnosis Generalized weakness;Altered mental status   PT Problem List Decreased strength;Decreased range of motion;Decreased activity  tolerance;Decreased balance;Decreased mobility;Decreased coordination;Decreased cognition;Decreased knowledge of use of DME;Decreased safety awareness;Decreased knowledge of precautions;Cardiopulmonary status limiting activity  PT Treatment Interventions DME instruction;Gait training;Functional mobility training;Therapeutic activities;Therapeutic exercise;Balance training;Neuromuscular re-education;Patient/family education   PT Goals (Current goals can be found in the Care Plan section) Acute Rehab PT Goals Patient Stated Goal: to get up to eat breakfast PT Goal Formulation: With patient Time For Goal Achievement: 06/25/15 Potential to Achieve Goals: Good    Frequency Min 2X/week   Barriers to discharge Decreased caregiver support      Co-evaluation               End of Session Equipment Utilized During Treatment: Gait belt Activity Tolerance: Patient tolerated treatment well;Patient limited by fatigue;Patient limited by lethargy Patient left: in chair;with call bell/phone within reach;with chair alarm set Nurse Communication: Mobility status         Time: [id number] PT Time Calculation (min) (ACUTE ONLY): 35 min   Charges:   PT Evaluation $PT Eval Low Complexity: 1 Procedure PT Treatments $Therapeutic Activity: 8-22 mins   PT G Codes:        Ramond Dial 2015/07/11, 1:29 PM   Mee Hives, PT MS Acute Rehab Dept. Number: [phone] [phone] and [place] [phone]

## 2015-06-12 LAB — BASIC METABOLIC PANEL
Anion gap: 6 (ref 5–15)
BUN: 7 mg/dL (ref 6–20)
CALCIUM: 8.4 mg/dL — AB (ref 8.9–10.3)
CHLORIDE: 112 mmol/L — AB (ref 101–111)
CO2: 22 mmol/L (ref 22–32)
CREATININE: 0.77 mg/dL (ref 0.44–1.00)
GFR calc Af Amer: >60 mL/min (ref >60)
GLUCOSE: 126 mg/dL — AB (ref 65–99)
Potassium: 3.5 mmol/L (ref 3.5–5.1)
Sodium: 140 mmol/L (ref 135–145)

## 2015-06-12 LAB — AMMONIA: Ammonia: 38 umol/L — ABNORMAL HIGH (ref 9–35)

## 2015-06-12 MED ORDER — POTASSIUM CHLORIDE CRYS ER 20 MEQ PO TBCR
20.0000 meq | EXTENDED_RELEASE_TABLET | Freq: Every day | ORAL | Status: DC
  Administered 2015-06-13: 20 meq via ORAL
  Filled 2015-06-12: qty 1

## 2015-06-12 MED ORDER — ALUM & MAG HYDROXIDE-SIMETH 200-200-20 MG/5ML PO SUSP
30.0000 mL | Freq: Three times a day (TID) | ORAL | Status: DC
  Administered 2015-06-12 – 2015-06-13 (×3): 30 mL via ORAL
  Filled 2015-06-12 (×3): qty 30

## 2015-06-12 NOTE — NC FL2 (Signed)
[place] LEVEL OF CARE SCREENING TOOL     IDENTIFICATION  Patient Name: Gloria Cole Birthdate: October 24, 1945 Sex: female Admission Date (Current Location): 06/10/2015  [place] and [place] Number:  Engineering geologist and Address:  [facility] [address] [place], [place] [address]      Provider Number: [id number]  Attending Physician Name and Address:  Gladstone Lighter, MD  Relative Name and Phone Number:       Current Level of Care: Hospital Recommended Level of Care: [place] Prior Approval Number:    Date Approved/Denied:   PASRR Number: [id number]  Discharge Plan: SNF    Current Diagnoses: Patient Active Problem List   Diagnosis Date Noted  . Hepatic encephalopathy ([place]) 06/10/2015  . Acute hepatic encephalopathy ([place]) 11/18/2014  . DM (diabetes mellitus) ([place]) 11/18/2014  . HTN (hypertension) 11/18/2014  . Nausea and vomiting 07/03/2012  . Gallstones 06/09/2012  . Hepatic cirrhosis ([place]) 06/09/2012    Orientation RESPIRATION BLADDER Height & Weight     Self, Place  Normal Continent Weight: 141 lb 8 oz (64.184 kg) Height:  5\' 5"  (165.1 cm)  BEHAVIORAL SYMPTOMS/MOOD NEUROLOGICAL BOWEL NUTRITION STATUS      Continent Diet (2 grams sodium)  AMBULATORY STATUS COMMUNICATION OF NEEDS Skin   Extensive Assist Verbally Normal                       Personal Care Assistance Level of Assistance  Bathing, Feeding, Dressing Bathing Assistance: Limited assistance Feeding assistance: Independent Dressing Assistance: Limited assistance     Functional Limitations Info  Sight, Hearing, Speech Sight Info: Adequate Hearing Info: Adequate Speech Info: Adequate    SPECIAL CARE FACTORS FREQUENCY  PT (By licensed PT)     PT Frequency: 5              Contractures      Additional Factors Info  Code Status, Allergies Code Status Info: Full Code Allergies Info: Penicillins          Current Medications (06/12/2015):  This is the current hospital active medication list Current Facility-Administered Medications  Medication Dose Route Frequency Provider Last Rate Last Dose  . acetaminophen (TYLENOL) tablet 650 mg  650 mg Oral Q6H PRN Gladstone Lighter, MD       Or  . acetaminophen (TYLENOL) suppository 650 mg  650 mg Rectal Q6H PRN Gladstone Lighter, MD      . alum & mag hydroxide-simeth (MAALOX/MYLANTA) 200-200-20 MG/5ML suspension 30 mL  30 mL Oral Q8H Gladstone Lighter, MD   30 mL at 06/12/15 1319  . enoxaparin (LOVENOX) injection 40 mg  40 mg Subcutaneous Q24H Gladstone Lighter, MD   40 mg at 06/11/15 2150  . feeding supplement (GLUCERNA SHAKE) (GLUCERNA SHAKE) liquid 237 mL  237 mL Oral BID BM Gladstone Lighter, MD   237 mL at 06/12/15 1400  . furosemide (LASIX) tablet 20 mg  20 mg Oral Daily Gladstone Lighter, MD   20 mg at 06/12/15 1006  . lactulose (CHRONULAC) 10 GM/15ML solution 30 g  30 g Oral TID Gladstone Lighter, MD   30 g at 06/12/15 1553  . metFORMIN (GLUCOPHAGE-XR) 24 hr tablet 1,000 mg  1,000 mg Oral Q breakfast Gladstone Lighter, MD   1,000 mg at 06/12/15 0826  . morphine 2 MG/ML injection 2 mg  2 mg Intravenous Q4H PRN Gladstone Lighter, MD      . multivitamin with minerals tablet 1 tablet  1 tablet  Oral Daily Gladstone Lighter, MD   1 tablet at 06/12/15 1001  . nadolol (CORGARD) tablet 20 mg  20 mg Oral Daily Gladstone Lighter, MD   20 mg at 06/12/15 1000  . nicotine (NICODERM CQ - dosed in mg/24 hours) patch 21 mg  21 mg Transdermal Daily Gladstone Lighter, MD   21 mg at 06/12/15 1001  . ondansetron (ZOFRAN) tablet 4 mg  4 mg Oral Q6H PRN Gladstone Lighter, MD       Or  . ondansetron (ZOFRAN) injection 4 mg  4 mg Intravenous Q6H PRN Gladstone Lighter, MD      . oxyCODONE (Oxy IR/ROXICODONE) immediate release tablet 5 mg  5 mg Oral Q4H PRN Gladstone Lighter, MD      . pantoprazole (PROTONIX) EC tablet 40 mg  40 mg Oral Daily Gladstone Lighter, MD   40 mg  at 06/12/15 1000  . [START ON 06/13/2015] potassium chloride SA (K-DUR,KLOR-CON) CR tablet 20 mEq  20 mEq Oral Daily Gladstone Lighter, MD      . rifaximin Doreene Nest) tablet 550 mg  550 mg Oral BID Gladstone Lighter, MD   550 mg at 06/12/15 1000  . spironolactone (ALDACTONE) tablet 25 mg  25 mg Oral Daily Gladstone Lighter, MD   25 mg at 06/12/15 1001  . vitamin B-12 (CYANOCOBALAMIN) tablet 500 mcg  500 mcg Oral Daily Gladstone Lighter, MD   500 mcg at 06/12/15 0959  . Vitamin D (Ergocalciferol) (DRISDOL) capsule 50,000 Units  50,000 Units Oral Q7 days Gladstone Lighter, MD   50,000 Units at 06/10/15 2238     Discharge Medications: Please see discharge summary for a list of discharge medications.  Relevant Imaging Results:  Relevant Lab Results:   Additional Information SSN:  [id number]  Darden Dates, LCSW

## 2015-06-12 NOTE — Clinical Social Work Placement (Signed)
   CLINICAL SOCIAL WORK PLACEMENT  NOTE  Date:  06/12/2015  Patient Details  Name: [name] MRN: [id number] Date of Birth: 1945/11/12  Clinical Social Work is seeking post-discharge placement for this patient at the [place] level of care (*CSW will initial, date and re-position this form in  chart as items are completed):  Yes   Patient/family provided with [place] Work Department's list of facilities offering this level of care within the geographic area requested by the patient (or if unable, by the patient's family).  Yes   Patient/family informed of their freedom to choose among providers that offer the needed level of care, that participate in Medicare, Medicaid or managed care program needed by the patient, have an available bed and are willing to accept the patient.  Yes   Patient/family informed of [facility]'s ownership interest in [facility] and [facility], as well as of the fact that they are under no obligation to receive care at these facilities.  PASRR submitted to EDS on 06/12/15     PASRR number received on 06/12/15     Existing PASRR number confirmed on       FL2 transmitted to all facilities in geographic area requested by pt/family on 06/12/15     FL2 transmitted to all facilities within larger geographic area on       Patient informed that his/her managed care company has contracts with or will negotiate with certain facilities, including the following:            Patient/family informed of bed offers received.  Patient chooses bed at       Physician recommends and patient chooses bed at      Patient to be transferred to   on  .  Patient to be transferred to facility by       Patient family notified on   of transfer.  Name of family member notified:        PHYSICIAN       Additional Comment:    _______________________________________________ Darden Dates, LCSW 06/12/2015, 6:05 PM

## 2015-06-12 NOTE — Progress Notes (Signed)
Gloria Cole NAME: Gloria Cole    MR#:  [id number]  DATE OF BIRTH:  [birth year]  SUBJECTIVE:  CHIEF COMPLAINT:   Chief Complaint  Patient presents with  . Altered Mental Status   - patient with hepatitis C and liver cirrhosis admitted with confusion - Ammonia is improving. Patient is less confused today. Physical therapy recommended rehabilitation. Patient very adamant about going home. Continue to monitor progress  REVIEW OF SYSTEMS:  Review of Systems  Constitutional: Positive for malaise/fatigue. Negative for fever and chills.  Respiratory: Negative for cough, shortness of breath and wheezing.   Cardiovascular: Negative for chest pain and palpitations.  Gastrointestinal: Positive for nausea. Negative for heartburn, vomiting, abdominal pain, diarrhea and constipation.  Genitourinary: Negative for dysuria.  Musculoskeletal: Positive for myalgias.  Neurological: Negative for dizziness, sensory change, speech change, focal weakness, seizures and headaches.  Psychiatric/Behavioral: Negative for depression.    DRUG ALLERGIES:   Allergies  Allergen Reactions  . Penicillins Swelling and Other (See Comments)    Reaction:  Facial swelling  Has patient had a PCN reaction causing immediate rash, facial/tongue/throat swelling, SOB or lightheadedness with hypotension: Yes Has patient had a PCN reaction causing severe rash involving mucus membranes or skin necrosis: No Has patient had a PCN reaction that required hospitalization No Has patient had a PCN reaction occurring within the last 10 years: No If all of the above answers are "NO", then may proceed with Cephalosporin use.    VITALS:  Blood pressure 127/52, pulse 76, temperature 98.5 F (36.9 C), temperature source Oral, resp. rate 16, height 5\' 5"  (1.651 m), weight 64.184 kg (141 lb 8 oz), SpO2 97 %.  PHYSICAL EXAMINATION:  Physical Exam  GENERAL: 70 y.o.-year-old  patient lying in the bed with no acute distress.  EYES: Pupils equal, round, reactive to light and accommodation. No scleral icterus. Extraocular muscles intact.  HEENT: Head atraumatic, normocephalic. Oropharynx and nasopharynx clear.  NECK: Supple, no jugular venous distention. No thyroid enlargement, no tenderness.  LUNGS: Normal breath sounds bilaterally, no wheezing, rales,rhonchi or crepitation. No use of accessory muscles of respiration.  CARDIOVASCULAR: S1, S2 normal. No murmurs, rubs, or gallops.  ABDOMEN: Soft, mild tenderness noted in epigastric, right upper quadrant and left upper quadrants, nondistended. Bowel sounds present. No organomegaly or mass.  EXTREMITIES: No pedal edema, cyanosis, or clubbing.  NEUROLOGIC: Cranial nerves are intact. She is following commands and able to go all 4 extremities. Sensation is intact. Gait not checked. Global weakness noted. PSYCHIATRIC: The patient is alert and oriented 2-3 today  SKIN: No obvious rash, lesion, or ulcer.    LABORATORY PANEL:   CBC  Recent Labs Lab 06/11/15 0510  WBC 5.3  HGB 11.5*  HCT 34.3*  PLT 105*   ------------------------------------------------------------------------------------------------------------------  Chemistries   Recent Labs Lab 06/10/15 1235  06/12/15 0452  NA 140  < > 140  K 3.9  < > 3.5  CL 108  < > 112*  CO2 25  < > 22  GLUCOSE 135*  < > 126*  BUN 12  < > 7  CREATININE 0.85  < > 0.77  CALCIUM 9.2  < > 8.4*  AST 50*  --   --   ALT 24  --   --   ALKPHOS 142*  --   --   BILITOT 2.3*  --   --   < > = values in this interval not displayed. ------------------------------------------------------------------------------------------------------------------  Cardiac Enzymes  No results for input(s): TROPONINI in the last 168 hours. ------------------------------------------------------------------------------------------------------------------  RADIOLOGY:  Ct Head Wo  Contrast  06/10/2015  CLINICAL DATA:  Confusion. EXAM: CT HEAD WITHOUT CONTRAST TECHNIQUE: Contiguous axial images were obtained from the base of the skull through the vertex without intravenous contrast. COMPARISON:  12/30/2012 FINDINGS: No acute cortical infarct, hemorrhage, or mass lesion ispresent. Ventricles are of normal size. No significant extra-axial fluid collection is present. The paranasal sinuses andmastoid air cells are clear. The osseous skull is intact. IMPRESSION: 1. Normal brain.  No acute intracranial abnormalities identified. Electronically Signed   By: Kerby Moors M.D.   On: 06/10/2015 17:08   Dg Chest Port 1 View  06/10/2015  CLINICAL DATA:  Altered mental status.  Initial encounter. EXAM: PORTABLE CHEST 1 VIEW COMPARISON:  None. FINDINGS: The lungs are clear. Heart size is normal. No pneumothorax or pleural effusion. Aortic atherosclerosis noted. IMPRESSION: No acute disease. Electronically Signed   By: Inge Rise M.D.   On: 06/10/2015 17:29    EKG:   Orders placed or performed during the hospital encounter of 06/10/15  . EKG 12-Lead  . EKG 12-Lead  . EKG 12-Lead  . EKG 12-Lead  . EKG 12-Lead  . EKG 12-Lead    ASSESSMENT AND PLAN:   Gloria Cole is a 70 y.o. female with a known history of hepatitis C with liver cirrhosis, esophageal varices and prior history of hepatic encephalopathy, diabetes presents to the hospital secondary to weakness and confusion.  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 Ammonia is Improving. At 38 today. -Continue lactulose and rifaximin. -Continue to monitor her mental status. -CT of the head negative for any acute findings. Urinalysis negative for infection. -Physical therapy recommended rehabilitation. Patient wants to go home. -Improving so continue to monitor progress  #2 liver cirrhosis-secondary to hepatitis C. Following with GI as outpatient. -Continue rifaximin, lactulose. Also on Lasix, Aldactone and  nadolol  #3 GERD-continue Protonix  #4 diabetes mellitus- on metformin. A1c is 7.8  #5 tobacco use disorder-on nicotine patch  #6 DVT prophylaxis-on Lovenox  #7 hypokalemia- replaced Will add daily potassium supplements while on Lasix and also taking lactulose    All the records are reviewed and case discussed with Care Management/Social Workerr. Management plans discussed with the patient, family and they are in agreement.  CODE STATUS: full code  TOTAL TIME TAKING CARE OF THIS PATIENT: 37 minutes.   POSSIBLE D/C tomorrow, DEPENDING ON CLINICAL CONDITION.   Gladstone Lighter M.D on 06/12/2015 at 12:03 PM  Between 7am to 6pm - Pager - [phone]  After 6pm go to [email or web address] - password [email or web address] [facility]  Office  [phone]  CC: Primary care physician; Kirk Ruths., MD

## 2015-06-12 NOTE — Clinical Social Work Note (Signed)
CSW consulted for New SNF. CSW spoke with pt's brother, with pt's permission regarding discharge plan as pt declined SNF. Pt's brother is in agreement as it is a safe discharge plan. Full assessment to follow.   Darden Dates, MSW, LCSW  Clinical Social Worker  [phone]

## 2015-06-13 LAB — BASIC METABOLIC PANEL
Anion gap: 6 (ref 5–15)
BUN: 7 mg/dL (ref 6–20)
CO2: 22 mmol/L (ref 22–32)
CREATININE: 0.77 mg/dL (ref 0.44–1.00)
Calcium: 8.6 mg/dL — ABNORMAL LOW (ref 8.9–10.3)
Chloride: 109 mmol/L (ref 101–111)
GFR calc Af Amer: >60 mL/min (ref >60)
GLUCOSE: 158 mg/dL — AB (ref 65–99)
POTASSIUM: 3.6 mmol/L (ref 3.5–5.1)
SODIUM: 137 mmol/L (ref 135–145)

## 2015-06-13 LAB — AMMONIA: Ammonia: 39 umol/L — ABNORMAL HIGH (ref 9–35)

## 2015-06-13 MED ORDER — POTASSIUM CHLORIDE CRYS ER 20 MEQ PO TBCR: 20.0000 meq | EXTENDED_RELEASE_TABLET | Freq: Every day | ORAL | Status: DC

## 2015-06-13 NOTE — Progress Notes (Addendum)
Notified Dr. Tressia Miners that patient had low BP last night, per MD only give lasix and hold nadolol and spironolactone

## 2015-06-13 NOTE — Care Management Important Message (Signed)
Important Message  Patient Details  Name: Gloria Cole MRN: [id number] Date of Birth: 26-May-1945   Medicare Important Message Given:  Yes    [name] 06/13/2015, 11:25 AM

## 2015-06-13 NOTE — Care Management (Addendum)
Plan for patient to discharge today.  PT has reevaluated and recommended that patient received home health PT. Patient states that she lives independently in a home that her brother owns.  At her baseline she has no medical equipment and  Drives herself.  Home health PT and RN has been ordered.  Patient provided option of agency choice.  Patient does not have a preference of agency.  Referrals made to [facility] with [facility] care.  Notified patients brother Mr. Maren Beach of updated plan, brother will arrive in approximately 1 hour to transport patient home. RNCM signing off

## 2015-06-13 NOTE — Clinical Social Work Note (Signed)
Clinical Social Work Assessment  Patient Details  Name: Gloria Cole MRN: [id number] Date of Birth: November 02, 1945  Date of referral:  06/12/15               Reason for consult:  Facility Placement                Permission sought to share information with:  Family Supports Permission granted to share information::  Yes, Verbal Permission Granted  Name::     Michelene Heady, brother   Housing/Transportation Living arrangements for the past 2 months:  Single Family Home Source of Information:  Patient, Other (Comment Required) (pt's brother, Vicente Serene) Patient Interpreter Needed:  None Criminal Activity/Legal Involvement Pertinent to Current Situation/Hospitalization:  No - Comment as needed Significant Relationships:  Siblings Lives with:  Self Do you feel safe going back to the place where you live?  Yes Need for family participation in patient care:  Yes (Comment)  Care giving concerns:  Pt lives alone and has a supportive brother, however he is unable to provide 24 supervision.    Social Worker assessment / plan:  CSW met with pt to address consult for SNF as recommended by PT. CSW introduced herself and explained role of social work. CSW also explained the process of discharging to SNF. PT declined SNF placement, however pt is somewhat confused at time of assessment. Pt is agreeable to CSW speaking with her brother. Pt reported that she lives with her brother. CSW spoke with pt's brother to address discharge plan. Pt's brother reported that pt does not live with him, however she lives in a home that he owns, and he is lives in another home. Pt's brother does check in with pt, but is unable to provide 24 supervision. CSW addressed discharge plan for SNF. Pt's brother is in agreement as it is the recommendation and the safest discharge plan. Pt's brother stated that he will speak with pt regarding his agreement with SNF placement. CSW initiated a SNF search and will follow up with bed  offers. CSW will continue to follow.   Employment status:  Retired Nurse, adult PT Recommendations:  [place] / Referral to community resources:  [place]  Patient/Family's Response to care:  Pt's brother was Patent attorney of CSW support.   Patient/Family's Understanding of and Emotional Response to Diagnosis, Current Treatment, and Prognosis:  Pt's brother is agreeable to SNF for STR, however pt would like to go home, which is likely an unsafe discharge plan.   Emotional Assessment Appearance:  Appears younger than stated age Attitude/Demeanor/Rapport:  Guarded Affect (typically observed):  Blunt Orientation:  Oriented to Self, Oriented to Place Alcohol / Substance use:  Never Used Psych involvement (Current and /or in the community):  No (Comment)  Discharge Needs  Concerns to be addressed:  Adjustment to Illness Readmission within the last 30 days:  No Current discharge risk:  Chronically ill Barriers to Discharge:  Continued Medical Work up   [employer], LCSW 06/13/2015, 8:57 AM

## 2015-06-13 NOTE — Discharge Summary (Signed)
[facility] [place] NAME: Gloria Cole    MR#:  [id number]  DATE OF BIRTH:  Jun 02, 1945  DATE OF ADMISSION:  06/10/2015 ADMITTING PHYSICIAN: Gladstone Lighter, MD  DATE OF DISCHARGE: 06/13/15  PRIMARY CARE PHYSICIAN: Kirk Ruths., MD    ADMISSION DIAGNOSIS:  Hepatic encephalopathy ([place]) [K72.90]  DISCHARGE DIAGNOSIS:  Active Problems:   Hepatic encephalopathy ([place])   SECONDARY DIAGNOSIS:   Past Medical History  Diagnosis Date  . Esophageal varices ([place]) March 2014  . Diabetes ([place]) 2006  . History of hepatitis C   . History of encephalopathy   . Hypertension   . Hyperlipidemia associated with type 2 diabetes mellitus ([place])   . GERD (gastroesophageal reflux disease)   . Cirrhosis ([place])   . Hepatitis   . Portal hypertensive gastropathy     HOSPITAL COURSE:   Gloria Cole is a 70 y.o. female with a known history of hepatitis C with liver cirrhosis, esophageal varices and prior history of hepatic encephalopathy, diabetes presents to the hospital secondary to weakness and confusion.  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 Ammonia is Improved. -Continue lactulose and rifaximin. -CT of the head negative for any acute findings. Urinalysis negative for infection. -Physical therapy recommended rehabilitation. Patient wants to go home. - Reevaluate PT today as patient is more alert and feels stronger.  #2 liver cirrhosis-secondary to hepatitis C. Following with GI as outpatient. -Continue rifaximin, lactulose. Also on Lasix, Aldactone and nadolol  #3 GERD-continue Protonix  #4 diabetes mellitus- on metformin. A1c is 7.8  #5 tobacco use disorder-on nicotine patch in hospital, counseled against smoking  #7 hypokalemia- replaced Adjusted the dose of daily potassium supplements as patient is on lasix and also on lactulose/.   Discharge home with home health  DISCHARGE CONDITIONS:    Guarded  CONSULTS OBTAINED:   None  DRUG ALLERGIES:   Allergies  Allergen Reactions  . Penicillins Swelling and Other (See Comments)    Reaction:  Facial swelling  Has patient had a PCN reaction causing immediate rash, facial/tongue/throat swelling, SOB or lightheadedness with hypotension: Yes Has patient had a PCN reaction causing severe rash involving mucus membranes or skin necrosis: No Has patient had a PCN reaction that required hospitalization No Has patient had a PCN reaction occurring within the last 10 years: No If all of the above answers are "NO", then may proceed with Cephalosporin use.    DISCHARGE MEDICATIONS:   Current Discharge Medication List    CONTINUE these medications which have CHANGED   Details  potassium chloride (K-DUR,KLOR-CON) 20 MEQ tablet Take 1 tablet (20 mEq total) by mouth daily. Qty: 30 tablet, Refills: 2      CONTINUE these medications which have NOT CHANGED   Details  cyanocobalamin 500 MCG tablet Take 500 mcg by mouth daily.    furosemide (LASIX) 20 MG tablet Take 20 mg by mouth daily.    lactulose (CHRONULAC) 10 GM/15ML solution Take 45 mLs (30 g total) by mouth 3 (three) times daily. Qty: 946 mL, Refills: 0    metFORMIN (GLUCOPHAGE-XR) 500 MG 24 hr tablet Take 1,000 mg by mouth daily with breakfast.     Multiple Vitamin (MULTIVITAMIN WITH MINERALS) TABS tablet Take 1 tablet by mouth daily.    nadolol (CORGARD) 20 MG tablet Take 20 mg by mouth daily.     ondansetron (ZOFRAN-ODT) 4 MG disintegrating tablet Take 4 mg by mouth every 8 (eight) hours as needed for nausea or vomiting.  pantoprazole (PROTONIX) 40 MG tablet Take 40 mg by mouth daily.     pravastatin (PRAVACHOL) 40 MG tablet Take 40 mg by mouth at bedtime.     promethazine (PHENERGAN) 25 MG tablet Take 25 mg by mouth every 4 (four) hours as needed for nausea or vomiting.    rifaximin (XIFAXAN) 550 MG TABS tablet Take 550 mg by mouth 2 (two) times daily.     spironolactone (ALDACTONE) 25 MG tablet Take 25 mg by mouth daily.    Vitamin D, Ergocalciferol, (DRISDOL) 50000 UNITS CAPS capsule Take 50,000 Units by mouth every 7 (seven) days. Pt takes on Thursday.         DISCHARGE INSTRUCTIONS:   1. PCP f/u in 1-2 weeks 2. GI f/u in 2 weeks 3. Please take lactulose regularly as prescribed.   If you experience worsening of your admission symptoms, develop shortness of breath, life threatening emergency, suicidal or homicidal thoughts you must seek medical attention immediately by calling 911 or calling your MD immediately  if symptoms less severe.  You Must read complete instructions/literature along with all the possible adverse reactions/side effects for all the Medicines you take and that have been prescribed to you. Take any new Medicines after you have completely understood and accept all the possible adverse reactions/side effects.   Please note  You were cared for by a hospitalist during your hospital stay. If you have any questions about your discharge medications or the care you received while you were in the hospital after you are discharged, you can call the unit and asked to speak with the hospitalist on call if the hospitalist that took care of you is not available. Once you are discharged, your primary care physician will handle any further medical issues. Please note that NO REFILLS for any discharge medications will be authorized once you are discharged, as it is imperative that you return to your primary care physician (or establish a relationship with a primary care physician if you do not have one) for your aftercare needs so that they can reassess your need for medications and monitor your lab values.    Today   CHIEF COMPLAINT:   Chief Complaint  Patient presents with  . Altered Mental Status    VITAL SIGNS:  Blood pressure 97/47, pulse 74, temperature 98.2 F (36.8 C), temperature source Oral, resp. rate 20, height 5'  5" (1.651 m), weight 64.184 kg (141 lb 8 oz), SpO2 97 %.  I/O:   Intake/Output Summary (Last 24 hours) at 06/13/15 0834 Last data filed at 06/13/15 0702  Gross per 24 hour  Intake    240 ml  Output      1 ml  Net    239 ml    PHYSICAL EXAMINATION:   Physical Exam  GENERAL: 70 y.o.-year-old patient lying in the bed with no acute distress.  EYES: Pupils equal, round, reactive to light and accommodation. No scleral icterus. Extraocular muscles intact.  HEENT: Head atraumatic, normocephalic. Oropharynx and nasopharynx clear.  NECK: Supple, no jugular venous distention. No thyroid enlargement, no tenderness.  LUNGS: Normal breath sounds bilaterally, no wheezing, rales,rhonchi or crepitation. No use of accessory muscles of respiration.  CARDIOVASCULAR: S1, S2 normal. No murmurs, rubs, or gallops.  ABDOMEN: Soft, mild tenderness noted in epigastric, right upper quadrant and left upper quadrants, nondistended. Bowel sounds present. No organomegaly or mass.  EXTREMITIES: No pedal edema, cyanosis, or clubbing.  NEUROLOGIC: Cranial nerves are intact. She is following commands and able to go  all 4 extremities. Sensation is intact. Gait not checked. Global weakness noted. PSYCHIATRIC: The patient is alert and oriented  3 today  SKIN: No obvious rash, lesion, or ulcer.   DATA REVIEW:   CBC  Recent Labs Lab 06/11/15 0510  WBC 5.3  HGB 11.5*  HCT 34.3*  PLT 105*    Chemistries   Recent Labs Lab 06/10/15 1235  06/13/15 0435  NA 140  < > 137  K 3.9  < > 3.6  CL 108  < > 109  CO2 25  < > 22  GLUCOSE 135*  < > 158*  BUN 12  < > 7  CREATININE 0.85  < > 0.77  CALCIUM 9.2  < > 8.6*  AST 50*  --   --   ALT 24  --   --   ALKPHOS 142*  --   --   BILITOT 2.3*  --   --   < > = values in this interval not displayed.  Cardiac Enzymes No results for input(s): TROPONINI in the last 168 hours.  Microbiology Results  Results for orders placed or performed in visit on  01/13/13  Urine culture     Status: None   Collection Time: 01/13/13  7:33 PM  Result Value Ref Range Status   Micro Text Report   Final       SOURCE: CLEAN CATCH    COMMENT                   MIXED BACTERIAL ORGANISMS   COMMENT                   RESULTS SUGGESTIVE OF CONTAMINATION   ANTIBIOTIC                                                        RADIOLOGY:  No results found.  EKG:   Orders placed or performed during the hospital encounter of 06/10/15  . EKG 12-Lead  . EKG 12-Lead  . EKG 12-Lead  . EKG 12-Lead  . EKG 12-Lead  . EKG 12-Lead      Management plans discussed with the patient, family and they are in agreement.  CODE STATUS:     Code Status Orders        Start     Ordered   06/10/15 2154  Full code   Continuous     06/10/15 2153    Code Status History    Date Active Date Inactive Code Status Order ID Comments User Context   11/18/2014  1:20 AM 11/19/2014  5:04 PM Full Code [id number]  Juluis Mire, MD ED      TOTAL TIME TAKING CARE OF THIS PATIENT:37  minutes.    Gladstone Lighter M.D on 06/13/2015 at 8:34 AM  Between 7am to 6pm - Pager - [phone]  After 6pm go to [email or web address] - password [email or web address] [facility]  Office  [phone]  CC: Primary care physician; Kirk Ruths., MD

## 2015-06-13 NOTE — Progress Notes (Signed)
Physical Therapy Treatment Patient Details Name: Gloria Cole MRN: [id number] DOB: 1945-11-07 Today's Date: 06/13/2015    History of Present Illness 70 yo female with significant AMS, has elevated ammonia from hep c, encephalopathy.  Has no AD used at home and lives alone.  PMHx:  DM, hep c    PT Comments    Pt demonstrated significant improvements towards functional goals. She was able to increase ambulation to 400 ft without AD and supervision. Demonstrated steady gait with good speed of 1.7 ft/sec. Dynamic tasks performed with HT and external perturbations with no LOB. Due to significant improvements discharge recommendations are updated to home with HHPT and intermittent supervision.  Follow Up Recommendations  Home health PT;Supervision - Intermittent     Equipment Recommendations  None recommended by PT    Recommendations for Other Services       Precautions / Restrictions Restrictions Weight Bearing Restrictions: No    Mobility  Bed Mobility Overal bed mobility: Modified Independent Bed Mobility: Supine to Sit     Supine to sit: Modified independent (Device/Increase time)     General bed mobility comments: elevated HOB  Transfers Overall transfer level: Modified independent Equipment used: None Transfers: Sit to/from [facility] Sit to Stand: Modified independent (Device/Increase time) Stand pivot transfers: Modified independent (Device/Increase time)       General transfer comment: good hand placement and safety  Ambulation/Gait Ambulation/Gait assistance: Supervision Ambulation Distance (Feet): 400 Feet Assistive device: None Gait Pattern/deviations: Narrow base of support Gait velocity: 1.7 ft/sec Gait velocity interpretation: at or above normal speed for age/gender General Gait Details: Significantly improved. Steady gait with good speed and no LOB.    Stairs            Wheelchair Mobility    Modified Rankin  (Stroke Patients Only)       Balance Overall balance assessment: Needs assistance Sitting-balance support: No upper extremity supported Sitting balance-Leahy Scale: Normal     Standing balance support: No upper extremity supported Standing balance-Leahy Scale: Good Standing balance comment: steady with no LOB                    Cognition Arousal/Alertness: Awake/alert Behavior During Therapy: Flat affect Overall Cognitive Status: Within Functional Limits for tasks assessed                      Exercises Other Exercises Other Exercises: Ambulated 452ft with no AD and supervision. Provided external perturbations and had pt perform head turns while walking to challenge balance. Pt maintained balance and was steady during session.    General Comments        Pertinent Vitals/Pain Pain Assessment: No/denies pain    Home Living                      Prior Function            PT Goals (current goals can now be found in the care plan section) Acute Rehab PT Goals Patient Stated Goal: to get up to eat breakfast PT Goal Formulation: With patient Time For Goal Achievement: 06/25/15 Potential to Achieve Goals: Good Progress towards PT goals: Progressing toward goals    Frequency  Min 2X/week    PT Plan Discharge plan needs to be updated    Co-evaluation             End of Session Equipment Utilized During Treatment: Gait belt Activity Tolerance: Patient tolerated treatment well  Patient left: in chair;with call bell/phone within reach;with chair alarm set     Time: [phone] PT Time Calculation (min) (ACUTE ONLY): 25 min  Charges:  $Gait Training: 23-37 mins                    G Codes:      Neoma Laming, PT, DPT  06/13/2015, 9:58 AM [phone]

## 2015-06-13 NOTE — Progress Notes (Signed)
Pt A and O x 4. VSS. Pt tolerating diet well. No complaints of pain or nausea. IV removed intact, prescriptions given. Pt voiced understanding of discharge instructions with no further questions. Home Health and PT set-up to come out to the home. Pt discharged via wheelchair with axillary.

## 2015-12-02 ENCOUNTER — Other Ambulatory Visit: Payer: Self-pay | Admitting: Physician Assistant

## 2015-12-02 DIAGNOSIS — K7460 Unspecified cirrhosis of liver: Secondary | ICD-10-CM

## 2015-12-02 DIAGNOSIS — R188 Other ascites: Principal | ICD-10-CM

## 2015-12-07 ENCOUNTER — Ambulatory Visit
Admission: RE | Admit: 2015-12-07 | Discharge: 2015-12-07 | Disposition: A | Discharge status: Home / Self Care | Payer: Medicare Other | Source: Ambulatory Visit | Attending: Physician Assistant | Admitting: Physician Assistant

## 2015-12-07 DIAGNOSIS — R188 Other ascites: Principal | ICD-10-CM

## 2015-12-07 DIAGNOSIS — R531 Weakness: Secondary | ICD-10-CM | POA: Diagnosis not present

## 2015-12-07 DIAGNOSIS — R932 Abnormal findings on diagnostic imaging of liver and biliary tract: Secondary | ICD-10-CM | POA: Insufficient documentation

## 2015-12-07 DIAGNOSIS — Z9889 Other specified postprocedural states: Secondary | ICD-10-CM | POA: Insufficient documentation

## 2015-12-07 DIAGNOSIS — K7460 Unspecified cirrhosis of liver: Secondary | ICD-10-CM

## 2015-12-07 DIAGNOSIS — Z9049 Acquired absence of other specified parts of digestive tract: Secondary | ICD-10-CM

## 2015-12-08 ENCOUNTER — Emergency Department: Payer: Medicare Other

## 2015-12-08 ENCOUNTER — Inpatient Hospital Stay
Admission: EM | Admit: 2015-12-08 | Discharge: 2015-12-12 | DRG: 432 | Disposition: A | Discharge status: Skilled Nursing Facility | Payer: Medicare Other | Attending: Internal Medicine | Admitting: Internal Medicine

## 2015-12-08 ENCOUNTER — Encounter: Payer: Self-pay | Admitting: Emergency Medicine

## 2015-12-08 DIAGNOSIS — E119 Type 2 diabetes mellitus without complications: Secondary | ICD-10-CM | POA: Diagnosis present

## 2015-12-08 DIAGNOSIS — R109 Unspecified abdominal pain: Secondary | ICD-10-CM

## 2015-12-08 DIAGNOSIS — Z823 Family history of stroke: Secondary | ICD-10-CM | POA: Diagnosis not present

## 2015-12-08 DIAGNOSIS — E785 Hyperlipidemia, unspecified: Secondary | ICD-10-CM | POA: Diagnosis present

## 2015-12-08 DIAGNOSIS — Z7951 Long term (current) use of inhaled steroids: Secondary | ICD-10-CM | POA: Diagnosis not present

## 2015-12-08 DIAGNOSIS — F17200 Nicotine dependence, unspecified, uncomplicated: Secondary | ICD-10-CM | POA: Diagnosis present

## 2015-12-08 DIAGNOSIS — I10 Essential (primary) hypertension: Secondary | ICD-10-CM | POA: Diagnosis present

## 2015-12-08 DIAGNOSIS — Z880 Allergy status to penicillin: Secondary | ICD-10-CM

## 2015-12-08 DIAGNOSIS — B1920 Unspecified viral hepatitis C without hepatic coma: Secondary | ICD-10-CM | POA: Diagnosis present

## 2015-12-08 DIAGNOSIS — Z66 Do not resuscitate: Secondary | ICD-10-CM | POA: Diagnosis present

## 2015-12-08 DIAGNOSIS — N179 Acute kidney failure, unspecified: Secondary | ICD-10-CM | POA: Diagnosis present

## 2015-12-08 DIAGNOSIS — K219 Gastro-esophageal reflux disease without esophagitis: Secondary | ICD-10-CM | POA: Diagnosis present

## 2015-12-08 DIAGNOSIS — R627 Adult failure to thrive: Secondary | ICD-10-CM | POA: Diagnosis present

## 2015-12-08 DIAGNOSIS — R188 Other ascites: Secondary | ICD-10-CM | POA: Diagnosis present

## 2015-12-08 DIAGNOSIS — F17210 Nicotine dependence, cigarettes, uncomplicated: Secondary | ICD-10-CM | POA: Diagnosis present

## 2015-12-08 DIAGNOSIS — Z8249 Family history of ischemic heart disease and other diseases of the circulatory system: Secondary | ICD-10-CM | POA: Diagnosis not present

## 2015-12-08 DIAGNOSIS — D696 Thrombocytopenia, unspecified: Secondary | ICD-10-CM | POA: Diagnosis present

## 2015-12-08 DIAGNOSIS — E860 Dehydration: Secondary | ICD-10-CM | POA: Diagnosis present

## 2015-12-08 DIAGNOSIS — Z6821 Body mass index (BMI) 21.0-21.9, adult: Secondary | ICD-10-CM

## 2015-12-08 DIAGNOSIS — B379 Candidiasis, unspecified: Secondary | ICD-10-CM | POA: Diagnosis present

## 2015-12-08 DIAGNOSIS — Z7901 Long term (current) use of anticoagulants: Secondary | ICD-10-CM | POA: Diagnosis not present

## 2015-12-08 DIAGNOSIS — E875 Hyperkalemia: Secondary | ICD-10-CM | POA: Diagnosis present

## 2015-12-08 DIAGNOSIS — R531 Weakness: Secondary | ICD-10-CM

## 2015-12-08 DIAGNOSIS — K766 Portal hypertension: Secondary | ICD-10-CM | POA: Diagnosis present

## 2015-12-08 DIAGNOSIS — R7989 Other specified abnormal findings of blood chemistry: Secondary | ICD-10-CM

## 2015-12-08 DIAGNOSIS — R2689 Other abnormalities of gait and mobility: Secondary | ICD-10-CM

## 2015-12-08 DIAGNOSIS — K652 Spontaneous bacterial peritonitis: Secondary | ICD-10-CM | POA: Diagnosis present

## 2015-12-08 DIAGNOSIS — M6281 Muscle weakness (generalized): Secondary | ICD-10-CM

## 2015-12-08 DIAGNOSIS — K7460 Unspecified cirrhosis of liver: Principal | ICD-10-CM | POA: Diagnosis present

## 2015-12-08 DIAGNOSIS — R1084 Generalized abdominal pain: Secondary | ICD-10-CM

## 2015-12-08 DIAGNOSIS — Z833 Family history of diabetes mellitus: Secondary | ICD-10-CM | POA: Diagnosis not present

## 2015-12-08 LAB — URINALYSIS COMPLETE WITH MICROSCOPIC (ARMC ONLY)
Bacteria, UA: NONE SEEN
Bilirubin Urine: NEGATIVE
GLUCOSE, UA: NEGATIVE mg/dL
HGB URINE DIPSTICK: NEGATIVE
Leukocytes, UA: NEGATIVE
Nitrite: NEGATIVE
Protein, ur: NEGATIVE mg/dL
Specific Gravity, Urine: 1.017 (ref 1.005–1.030)
pH: 5 (ref 5.0–8.0)

## 2015-12-08 LAB — CBC WITH DIFFERENTIAL/PLATELET
BASOS PCT: 0 %
Basophils Absolute: 0 10*3/uL (ref 0–0.1)
EOS ABS: 0.1 10*3/uL (ref 0–0.7)
Eosinophils Relative: 1 %
HEMATOCRIT: 41.5 % (ref 35.0–47.0)
HEMOGLOBIN: 14 g/dL (ref 12.0–16.0)
Lymphocytes Relative: 22 %
Lymphs Abs: 2.2 10*3/uL (ref 1.0–3.6)
MCH: 35 pg — ABNORMAL HIGH (ref 26.0–34.0)
MCHC: 33.7 g/dL (ref 32.0–36.0)
MCV: 103.7 fL — ABNORMAL HIGH (ref 80.0–100.0)
MONOS PCT: 11 %
Monocytes Absolute: 1.1 10*3/uL — ABNORMAL HIGH (ref 0.2–0.9)
NEUTROS ABS: 6.6 10*3/uL — AB (ref 1.4–6.5)
NEUTROS PCT: 66 %
Platelets: 78 10*3/uL — ABNORMAL LOW (ref 150–440)
RBC: 4 MIL/uL (ref 3.80–5.20)
RDW: 16.8 % — ABNORMAL HIGH (ref 11.5–14.5)
WBC: 10 10*3/uL (ref 3.6–11.0)

## 2015-12-08 LAB — POTASSIUM
POTASSIUM: 6.3 mmol/L — AB (ref 3.5–5.1)
Potassium: 5.3 mmol/L — ABNORMAL HIGH (ref 3.5–5.1)

## 2015-12-08 LAB — PROTIME-INR
INR: 2.19
PROTHROMBIN TIME: 24.7 s — AB (ref 11.4–15.2)

## 2015-12-08 LAB — GLUCOSE, CAPILLARY
Glucose-Capillary: 109 mg/dL — ABNORMAL HIGH (ref 65–99)
Glucose-Capillary: 210 mg/dL — ABNORMAL HIGH (ref 65–99)

## 2015-12-08 LAB — COMPREHENSIVE METABOLIC PANEL
ALT: 32 U/L (ref 14–54)
AST: 61 U/L — ABNORMAL HIGH (ref 15–41)
Albumin: 2.2 g/dL — ABNORMAL LOW (ref 3.5–5.0)
Alkaline Phosphatase: 102 U/L (ref 38–126)
Anion gap: 9 (ref 5–15)
BUN: 47 mg/dL — ABNORMAL HIGH (ref 6–20)
CHLORIDE: 110 mmol/L (ref 101–111)
CO2: 17 mmol/L — AB (ref 22–32)
CREATININE: 1.68 mg/dL — AB (ref 0.44–1.00)
Calcium: 8.5 mg/dL — ABNORMAL LOW (ref 8.9–10.3)
GFR calc non Af Amer: 29 mL/min — ABNORMAL LOW (ref >60)
GFR, EST AFRICAN AMERICAN: 34 mL/min — AB (ref >60)
Glucose, Bld: 104 mg/dL — ABNORMAL HIGH (ref 65–99)
Potassium: 6.5 mmol/L (ref 3.5–5.1)
SODIUM: 136 mmol/L (ref 135–145)
Total Bilirubin: 3.4 mg/dL — ABNORMAL HIGH (ref 0.3–1.2)
Total Protein: 6.5 g/dL (ref 6.5–8.1)

## 2015-12-08 LAB — TROPONIN I: TROPONIN I: 0.04 ng/mL — AB (ref <0.03)

## 2015-12-08 LAB — LIPASE, BLOOD: Lipase: 45 U/L (ref 11–51)

## 2015-12-08 LAB — AMMONIA: AMMONIA: 19 umol/L (ref 9–35)

## 2015-12-08 LAB — LACTIC ACID, PLASMA: LACTIC ACID, VENOUS: 3.7 mmol/L — AB (ref 0.5–1.9)

## 2015-12-08 MED ORDER — SODIUM CHLORIDE 0.9 % IV BOLUS (SEPSIS)
1000.0000 mL | Freq: Once | INTRAVENOUS | Status: AC
  Administered 2015-12-08: 1000 mL via INTRAVENOUS

## 2015-12-08 MED ORDER — DEXTROSE 250 MG/ML IV SOLN: 25.0000 g | Freq: Once | INTRAVENOUS | Status: DC

## 2015-12-08 MED ORDER — FLUCONAZOLE IN SODIUM CHLORIDE 100-0.9 MG/50ML-% IV SOLN
100.0000 mg | Freq: Every day | INTRAVENOUS | Status: DC
  Filled 2015-12-08: qty 50

## 2015-12-08 MED ORDER — ONDANSETRON 4 MG PO TBDP
4.0000 mg | ORAL_TABLET | Freq: Three times a day (TID) | ORAL | Status: DC | PRN
  Administered 2015-12-11: 4 mg via ORAL
  Filled 2015-12-08: qty 1

## 2015-12-08 MED ORDER — PROMETHAZINE HCL 25 MG PO TABS: 25.0000 mg | ORAL_TABLET | ORAL | Status: DC | PRN

## 2015-12-08 MED ORDER — DEXTROSE 50 % IV SOLN
INTRAVENOUS | Status: AC
  Administered 2015-12-08: 25 g via INTRAVENOUS
  Filled 2015-12-08: qty 50

## 2015-12-08 MED ORDER — TRAMADOL HCL 50 MG PO TABS
50.0000 mg | ORAL_TABLET | Freq: Three times a day (TID) | ORAL | Status: DC | PRN
  Administered 2015-12-08 – 2015-12-11 (×3): 50 mg via ORAL
  Filled 2015-12-08 (×3): qty 1

## 2015-12-08 MED ORDER — CALCIUM GLUCONATE 10 % IV SOLN
INTRAVENOUS | Status: AC
  Filled 2015-12-08: qty 10

## 2015-12-08 MED ORDER — RIFAXIMIN 550 MG PO TABS
550.0000 mg | ORAL_TABLET | Freq: Two times a day (BID) | ORAL | Status: DC
  Administered 2015-12-08 – 2015-12-12 (×8): 550 mg via ORAL
  Filled 2015-12-08 (×8): qty 1

## 2015-12-08 MED ORDER — NICOTINE 14 MG/24HR TD PT24
14.0000 mg | MEDICATED_PATCH | Freq: Every day | TRANSDERMAL | Status: DC
  Administered 2015-12-08 – 2015-12-12 (×5): 14 mg via TRANSDERMAL
  Filled 2015-12-08 (×5): qty 1

## 2015-12-08 MED ORDER — CYANOCOBALAMIN 500 MCG PO TABS
500.0000 ug | ORAL_TABLET | ORAL | Status: DC
  Administered 2015-12-09 – 2015-12-11 (×2): 500 ug via ORAL
  Filled 2015-12-08 (×3): qty 1

## 2015-12-08 MED ORDER — INSULIN ASPART 100 UNIT/ML ~~LOC~~ SOLN
0.0000 [IU] | Freq: Three times a day (TID) | SUBCUTANEOUS | Status: DC
  Administered 2015-12-09: 1 [IU] via SUBCUTANEOUS
  Administered 2015-12-10 – 2015-12-12 (×4): 2 [IU] via SUBCUTANEOUS
  Filled 2015-12-08: qty 1
  Filled 2015-12-08 (×4): qty 2

## 2015-12-08 MED ORDER — GABAPENTIN 100 MG PO CAPS
ORAL_CAPSULE | ORAL | Status: AC
  Administered 2015-12-08: 100 mg via ORAL
  Filled 2015-12-08: qty 1

## 2015-12-08 MED ORDER — DEXTROSE 50 % IV SOLN
25.0000 g | Freq: Once | INTRAVENOUS | Status: AC
  Administered 2015-12-08: 25 g via INTRAVENOUS

## 2015-12-08 MED ORDER — ACETAMINOPHEN 325 MG PO TABS
650.0000 mg | ORAL_TABLET | Freq: Four times a day (QID) | ORAL | Status: DC | PRN
  Administered 2015-12-10: 650 mg via ORAL
  Filled 2015-12-08: qty 2

## 2015-12-08 MED ORDER — GABAPENTIN 100 MG PO CAPS
100.0000 mg | ORAL_CAPSULE | Freq: Two times a day (BID) | ORAL | Status: DC
  Administered 2015-12-08 – 2015-12-12 (×9): 100 mg via ORAL
  Filled 2015-12-08 (×8): qty 1

## 2015-12-08 MED ORDER — DICLOFENAC SODIUM 1 % TD GEL
2.0000 g | Freq: Three times a day (TID) | TRANSDERMAL | Status: DC
  Administered 2015-12-09 – 2015-12-12 (×9): 2 g via TOPICAL
  Filled 2015-12-08 (×2): qty 100

## 2015-12-08 MED ORDER — LACTULOSE 10 GM/15ML PO SOLN
30.0000 g | Freq: Three times a day (TID) | ORAL | Status: DC
  Administered 2015-12-08 – 2015-12-12 (×10): 30 g via ORAL
  Filled 2015-12-08 (×11): qty 60

## 2015-12-08 MED ORDER — ACETAMINOPHEN 650 MG RE SUPP: 650.0000 mg | Freq: Four times a day (QID) | RECTAL | Status: DC | PRN

## 2015-12-08 MED ORDER — PANTOPRAZOLE SODIUM 40 MG PO TBEC
40.0000 mg | DELAYED_RELEASE_TABLET | Freq: Every day | ORAL | Status: DC
  Administered 2015-12-09 – 2015-12-12 (×4): 40 mg via ORAL
  Filled 2015-12-08 (×4): qty 1

## 2015-12-08 MED ORDER — FOLIC ACID 1 MG PO TABS
0.5000 mg | ORAL_TABLET | Freq: Every day | ORAL | Status: DC
  Administered 2015-12-09 – 2015-12-12 (×4): 0.5 mg via ORAL
  Filled 2015-12-08 (×4): qty 1

## 2015-12-08 MED ORDER — CALCIUM GLUCONATE 10 % IV SOLN
1.0000 g | INTRAVENOUS | Status: AC
  Administered 2015-12-08: 1 g via INTRAVENOUS

## 2015-12-08 MED ORDER — SODIUM CHLORIDE 0.9 % IV SOLN
INTRAVENOUS | Status: DC
  Administered 2015-12-08: 1000 mL via INTRAVENOUS
  Administered 2015-12-09 – 2015-12-10 (×3): via INTRAVENOUS

## 2015-12-08 MED ORDER — VITAMIN B12-FOLIC ACID 500-400 MCG PO TABS: 1.0000 | ORAL_TABLET | Freq: Every day | ORAL | Status: DC

## 2015-12-08 MED ORDER — INSULIN ASPART 100 UNIT/ML ~~LOC~~ SOLN
SUBCUTANEOUS | Status: AC
  Administered 2015-12-08: 10 [IU] via INTRAVENOUS
  Filled 2015-12-08: qty 10

## 2015-12-08 MED ORDER — LEVOFLOXACIN IN D5W 750 MG/150ML IV SOLN
750.0000 mg | INTRAVENOUS | Status: DC
  Administered 2015-12-08 – 2015-12-12 (×3): 750 mg via INTRAVENOUS
  Filled 2015-12-08 (×3): qty 150

## 2015-12-08 MED ORDER — FLUTICASONE PROPIONATE 50 MCG/ACT NA SUSP
2.0000 | Freq: Every day | NASAL | Status: DC
  Administered 2015-12-11 – 2015-12-12 (×2): 2 via NASAL
  Filled 2015-12-08 (×2): qty 16

## 2015-12-08 MED ORDER — FLUCONAZOLE IN SODIUM CHLORIDE 200-0.9 MG/100ML-% IV SOLN
200.0000 mg | Freq: Once | INTRAVENOUS | Status: AC
  Administered 2015-12-08: 200 mg via INTRAVENOUS
  Filled 2015-12-08: qty 100

## 2015-12-08 MED ORDER — SODIUM POLYSTYRENE SULFONATE 15 GM/60ML PO SUSP: 30.0000 g | ORAL | Status: AC

## 2015-12-08 MED ORDER — SODIUM BICARBONATE 8.4 % IV SOLN
INTRAVENOUS | Status: AC
  Administered 2015-12-08: 25 meq via INTRAVENOUS
  Filled 2015-12-08: qty 50

## 2015-12-08 MED ORDER — INSULIN ASPART 100 UNIT/ML ~~LOC~~ SOLN
0.0000 [IU] | Freq: Every day | SUBCUTANEOUS | Status: DC
  Administered 2015-12-08: 2 [IU] via SUBCUTANEOUS
  Filled 2015-12-08: qty 2

## 2015-12-08 MED ORDER — SODIUM BICARBONATE 8.4 % IV SOLN
25.0000 meq | Freq: Once | INTRAVENOUS | Status: AC
  Administered 2015-12-08: 25 meq via INTRAVENOUS

## 2015-12-08 MED ORDER — INSULIN ASPART 100 UNIT/ML ~~LOC~~ SOLN: 10.0000 [IU] | Freq: Once | SUBCUTANEOUS | Status: DC

## 2015-12-08 MED ORDER — VITAMIN B-12 1000 MCG PO TABS
500.0000 ug | ORAL_TABLET | Freq: Every day | ORAL | Status: DC
  Administered 2015-12-09 – 2015-12-12 (×4): 500 ug via ORAL
  Filled 2015-12-08 (×3): qty 1

## 2015-12-08 MED ORDER — SODIUM POLYSTYRENE SULFONATE 15 GM/60ML PO SUSP: 30.0000 g | Freq: Once | ORAL | Status: DC

## 2015-12-08 MED ORDER — INSULIN ASPART 100 UNIT/ML ~~LOC~~ SOLN
10.0000 [IU] | Freq: Once | SUBCUTANEOUS | Status: AC
  Administered 2015-12-08: 10 [IU] via INTRAVENOUS

## 2015-12-08 MED ORDER — SODIUM CHLORIDE 0.9% FLUSH
3.0000 mL | Freq: Two times a day (BID) | INTRAVENOUS | Status: DC
  Administered 2015-12-08: 3 mL via INTRAVENOUS

## 2015-12-08 MED ORDER — ADULT MULTIVITAMIN W/MINERALS CH
1.0000 | ORAL_TABLET | Freq: Every day | ORAL | Status: DC
  Administered 2015-12-09 – 2015-12-12 (×4): 1 via ORAL
  Filled 2015-12-08 (×4): qty 1

## 2015-12-08 NOTE — Progress Notes (Signed)
Patient received from emergency room at 1530 and patient lethargic and alert to self only. IV fluids started as ordered, family at the bedside. Diflucan given as ordered. Vital sign within normal limits

## 2015-12-08 NOTE — H&P (Signed)
[facility] [place] NAME: Jasira Ostrom    MR#:  [id number]  DATE OF BIRTH:  01-15-1946  DATE OF ADMISSION:  12/08/2015  PRIMARY CARE PHYSICIAN: Kirk Ruths., MD   REQUESTING/REFERRING PHYSICIAN:   CHIEF COMPLAINT:   Chief Complaint  Patient presents with  . Weakness    HISTORY OF PRESENT ILLNESS:  Louberta Heldreth  is a 70 y.o. female with a known history of Cirrhosis of the liver. Patient states that the family brought her in because she has some hemorrhoids. Family then states that her abdomen is becoming more swollen and her ankles are swollen and sore. She's not been eating for the last couple weeks. They have a hard time giving her medications. She's not been walking recently. She is not stable on her feet when she does walk. She's had some intermittent nausea and vomiting and abdominal pain. Patient is not the best historian but does answer questions appropriately. In the ER she was found to be in acute kidney injury with hyperkalemia and hospitalist services were contacted for further evaluation  PAST MEDICAL HISTORY:   Past Medical History:  Diagnosis Date  . Cirrhosis ([place])   . Diabetes ([place]) 2006  . Esophageal varices ([place]) March 2014  . GERD (gastroesophageal reflux disease)   . Hepatitis   . History of encephalopathy   . History of hepatitis C   . Hyperlipidemia associated with type 2 diabetes mellitus ([place])   . Hypertension   . Portal hypertensive gastropathy     PAST SURGICAL HISTORY:   Past Surgical History:  Procedure Laterality Date  . ABDOMINAL HYSTERECTOMY  1994  . BREAST BIOPSY Right 1995   neg  . CHOLECYSTECTOMY  April 2014   Dr Bary Castilla  . COLONOSCOPY  2010  . COLONOSCOPY WITH PROPOFOL N/A 03/04/2015   Procedure: COLONOSCOPY WITH PROPOFOL;  Surgeon: Hulen Luster, MD;  Location: [facility] ENDOSCOPY;  Service: Gastroenterology;  Laterality: N/A;  . ERCP  April 2014   Dr Candace Cruise  .  ESOPHAGOGASTRODUODENOSCOPY (EGD) WITH PROPOFOL N/A 10/07/2014   Procedure: ESOPHAGOGASTRODUODENOSCOPY (EGD) WITH PROPOFOL;  Surgeon: Hulen Luster, MD;  Location[facility] ENDOSCOPY;  Service: Gastroenterology;  Laterality: N/A;  . ESOPHAGOGASTRODUODENOSCOPY (EGD) WITH PROPOFOL N/A 12/16/2014   Procedure: ESOPHAGOGASTRODUODENOSCOPY (EGD) WITH PROPOFOL;  Surgeon: Hulen Luster, MD;  Location: [facility] ENDOSCOPY;  Service: Gastroenterology;  Laterality: N/A;  . ESOPHAGOGASTRODUODENOSCOPY (EGD) WITH PROPOFOL N/A 03/04/2015   Procedure: ESOPHAGOGASTRODUODENOSCOPY (EGD) WITH PROPOFOL;  Surgeon: Hulen Luster, MD;  Location: [facility] ENDOSCOPY;  Service: Gastroenterology;  Laterality: N/A;  . UPPER GI ENDOSCOPY  2014    SOCIAL HISTORY:   Social History  Substance Use Topics  . Smoking status: Current Some Day Smoker    Packs/day: 0.25    Years: 30.00    Types: Cigarettes  . Smokeless tobacco: Never Used  . Alcohol use No     Comment: occasionally    FAMILY HISTORY:   Family History  Problem Relation Age of Onset  . Diabetes Sister   . Diabetes Brother   . CAD Mother   . CVA Father   . Hypertension Father   . Diabetes Father     DRUG ALLERGIES:   Allergies  Allergen Reactions  . Penicillins Swelling and Other (See Comments)    Reaction:  Facial swelling  Has patient had a PCN reaction causing immediate rash, facial/tongue/throat swelling, SOB or lightheadedness with hypotension: Yes Has patient had a PCN reaction causing severe rash involving  mucus membranes or skin necrosis: No Has patient had a PCN reaction that required hospitalization No Has patient had a PCN reaction occurring within the last 10 years: No If all of the above answers are "NO", then may proceed with Cephalosporin use.    REVIEW OF SYSTEMS:  CONSTITUTIONAL: Positive for fever, chills and sweats. Positive for weight loss. Positive for fatigue.  EYES: No blurred or double vision. Wears glasses EARS, NOSE, AND THROAT: No tinnitus or  ear pain. Positive for sore throat and runny nose. Positive for dysphagia to liquids and solids RESPIRATORY:  Positive for cough and shortness of breath. No wheezing or hemoptysis.  CARDIOVASCULAR: No chest pain. Positive for edema.  GASTROINTESTINAL: Positive for nausea and vomiting. Positive for diarrhea with medication. Positive for abdominal pain. Positive for blood on bowel movements secondary to hemorrhoids. GENITOURINARY: Positive for dysuria, no hematuria.  ENDOCRINE: No polyuria, nocturia,  HEMATOLOGY: No anemia, easy bruising or bleeding SKIN: No rash or lesion. MUSCULOSKELETAL: Positive for joint pain.   NEUROLOGIC: No tingling, numbness, weakness.  PSYCHIATRY: No anxiety or depression.   MEDICATIONS AT HOME:   Prior to Admission medications   Medication Sig Start Date End Date Taking? Authorizing Provider  Cobalamine Combinations (VITAMIN B12-FOLIC ACID) XX123456 MCG TABS Take 1 tablet by mouth daily.   Yes Historical Provider, MD  cyanocobalamin 500 MCG tablet Take 500 mcg by mouth every other day.    Yes Historical Provider, MD  Diclofenac Sodium 3 % GEL Place 2 g onto the skin 3 (three) times daily.   Yes Historical Provider, MD  fluticasone (FLONASE) 50 MCG/ACT nasal spray Place 2 sprays into both nostrils daily.   Yes Historical Provider, MD  furosemide (LASIX) 20 MG tablet Take 20 mg by mouth daily.   Yes Historical Provider, MD  gabapentin (NEURONTIN) 100 MG capsule Take 100 mg by mouth 2 (two) times daily.   Yes Historical Provider, MD  lactulose (CHRONULAC) 10 GM/15ML solution Take 45 mLs (30 g total) by mouth 3 (three) times daily. Patient taking differently: Take 30 g by mouth 4 (four) times daily.  11/19/14  Yes Srikar Sudini, MD  metFORMIN (GLUCOPHAGE-XR) 500 MG 24 hr tablet Take 1,000 mg by mouth daily with breakfast.    Yes Historical Provider, MD  Multiple Vitamin (MULTIVITAMIN WITH MINERALS) TABS tablet Take 1 tablet by mouth daily.   Yes Historical Provider, MD   nadolol (CORGARD) 20 MG tablet Take 20 mg by mouth daily.    Yes Historical Provider, MD  ondansetron (ZOFRAN-ODT) 4 MG disintegrating tablet Take 4 mg by mouth every 8 (eight) hours as needed for nausea or vomiting.   Yes Historical Provider, MD  pantoprazole (PROTONIX) 40 MG tablet Take 40 mg by mouth daily.    Yes Historical Provider, MD  potassium chloride (K-DUR,KLOR-CON) 10 MEQ tablet Take 10 mEq by mouth daily.   Yes Historical Provider, MD  pravastatin (PRAVACHOL) 40 MG tablet Take 40 mg by mouth at bedtime.    Yes Historical Provider, MD  promethazine (PHENERGAN) 25 MG tablet Take 25 mg by mouth every 4 (four) hours as needed for nausea or vomiting.   Yes Historical Provider, MD  rifaximin (XIFAXAN) 550 MG TABS tablet Take 550 mg by mouth 2 (two) times daily.   Yes Historical Provider, MD  spironolactone (ALDACTONE) 50 MG tablet Take 50 mg by mouth daily.    Yes Historical Provider, MD      VITAL SIGNS:  Blood pressure 112/61, pulse 77, temperature 98 F (36.7  C), temperature source Oral, resp. rate 16, height 5\' 6"  (1.676 m), weight 61.2 kg (135 lb), SpO2 96 %.  PHYSICAL EXAMINATION:  GENERAL:  70 y.o.-year-old patient lying in the bed with no acute distress.  EYES: Pupils equal, round, reactive to light and accommodation. Slight scleral icterus. Extraocular muscles intact.  HEENT: Head atraumatic, normocephalic. Oropharynx and nasopharynx clear.  NECK:  Supple, no jugular venous distention. No thyroid enlargement, no tenderness.  LUNGS: Decreased breath sounds bilaterally lower lung field, no wheezing, rales,rhonchi or crepitation. No use of accessory muscles of respiration.  CARDIOVASCULAR: S1, S2 normal. No murmurs, rubs, or gallops.  ABDOMEN: Soft, generalized tenderness throughout the abdomen, nondistended. Bowel sounds present. No organomegaly or mass.  EXTREMITIES: Trace edema, no cyanosis, or clubbing.  NEUROLOGIC: Cranial nerves II through XII are intact. Muscle strength  5/5 in all extremities. Sensation intact. Gait not checked.  PSYCHIATRIC: The patient is alert and oriented x 3.  SKIN: No rash, lesion, or ulcer.   LABORATORY PANEL:   CBC  Recent Labs Lab 12/08/15 0914  WBC 10.0  HGB 14.0  HCT 41.5  PLT 78*   ------------------------------------------------------------------------------------------------------------------  Chemistries   Recent Labs Lab 12/08/15 1005  NA 136  K 6.5*  CL 110  CO2 17*  GLUCOSE 104*  BUN 47*  CREATININE 1.68*  CALCIUM 8.5*  AST 61*  ALT 32  ALKPHOS 102  BILITOT 3.4*   ------------------------------------------------------------------------------------------------------------------  Cardiac Enzymes  Recent Labs Lab 12/08/15 1005  TROPONINI 0.04*   ------------------------------------------------------------------------------------------------------------------  RADIOLOGY:  Dg Chest 2 View  Result Date: 12/08/2015 CLINICAL DATA:  Cough, congestion, SOB x 2wk, generalized weakness, Vomited twice yesterday, diarrhea. Hx - HTN, Cirrhosis, diabetic, current 0.25ppd smoker EXAM: CHEST  2 VIEW COMPARISON:  06/10/2015 FINDINGS: Heart size is normal. There is perihilar peribronchial thickening. There is a dense band in the right lung, consistent with right upper lobe atelectasis. A density projects over the left scapula and likely represents an EKG lead. Attention to this region is directed on follow-up studies. Surgical clips are noted in the upper abdomen. IMPRESSION: 1. Bronchitic changes. 2. Right upper lobe atelectasis which warrants follow-up to exclude obstruction. 3. Suspect EKG lead causing the density in the left upper lobe. However a followup will be helpful to assess this region. Electronically Signed   By: Nolon Nations M.D.   On: 12/08/2015 10:10   US Abdomen Limited Ruq  Result Date: 12/07/2015 CLINICAL DATA:  Cirrhosis, status postcholecystectomy EXAM: US ABDOMEN LIMITED - RIGHT UPPER  QUADRANT COMPARISON:  11/15/2014 FINDINGS: Gallbladder: Surgically absent Common bile duct: Diameter: 4.2 mm in diameter within normal limits. Liver: No focal hepatic mass. Again noted nodular contour of the liver consistent with cirrhosis. Abdominal ascites is noted all 4 quadrants of the abdomen. IMPRESSION: 1. Surgically absent gallbladder. Normal CBD. Abdominal ascites is noted. Nodular contour of the liver consistent with cirrhosis. No focal hepatic mass. Electronically Signed   By: Lahoma Crocker M.D.   On: 12/07/2015 13:36    EKG:   Sinus rhythm 72 bpm.  IMPRESSION AND PLAN:   1. Abdominal pain with cirrhosis and ascites. Likely SBP. ER physician spoke with interventional radiology and they will do a paracentesis this afternoon. Try to hold off until after procedure for antibiotics. 2. Acute kidney injury with hyperkalemia and dehydration. There is slight hemolysis on this potassium sample. But I will treat since it is 6.5. Stop potassium supplementation. Calcium gluconate, insulin D50 sodium bicarbonate and Kayexalate ordered. Repeat potassium in the afternoon.  IV fluid hydration. 3. Thrush. IV Diflucan. This could be why the patient has not had an appetite for the past few weeks. 4. Tobacco abuse. Smoking cessation counseling done 3 minutes by me. Nicotine patch ordered. 5. Cirrhosis of the liver, thrombocytopenia, elevated PT secondary to liver disease. Slight jaundice secondary to liver disease. Hold Lasix, spironolactone and not all at this point. 6. Essential hypertension. Blood pressure on the lower side hold medications. 7. Type 2 diabetes mellitus. Sliding scale insulin for now. Glucophage contraindicated with acute kidney injury. 8. Hyperlipidemia hold off on cholesterol medication at this point.   All the records are reviewed and case discussed with ED provider. Management plans discussed with the patient, family and they are in agreement.  CODE STATUS: DO NOT RESUSCITATE  TOTAL  TIME TAKING CARE OF THIS PATIENT: 50 minutes.    Loletha Grayer M.D on 12/08/2015 at 11:40 AM  Between 7am to 6pm - Pager - [phone]  After 6pm call admission pager [phone]  [facility]  [phone]  CC: Primary care physician; Kirk Ruths., MD

## 2015-12-08 NOTE — Progress Notes (Signed)
Pharmacy Antibiotic Note  Gloria Cole is a 70 y.o. female admitted on 12/08/2015 with SBP.  Pharmacy has been consulted for levofloxacin dosing (patient has a penicillin allergy).  Plan: Levofloxacin 750 mg IV q48h   Height: 5\' 6"  (167.6 cm) Weight: 135 lb (61.2 kg) IBW/kg (Calculated) : 59.3  Temp (24hrs), Avg:98 F (36.7 C), Min:98 F (36.7 C), Max:98 F (36.7 C)   Recent Labs Lab 12/08/15 0914 12/08/15 1004 12/08/15 1005  WBC 10.0  --   --   CREATININE  --   --  1.68*  LATICACIDVEN  --  3.7*  --     Estimated Creatinine Clearance: 28.3 mL/min (by C-G formula based on SCr of 1.68 mg/dL (H)).    Allergies  Allergen Reactions  . Penicillins Swelling and Other (See Comments)    Reaction:  Facial swelling  Has patient had a PCN reaction causing immediate rash, facial/tongue/throat swelling, SOB or lightheadedness with hypotension: Yes Has patient had a PCN reaction causing severe rash involving mucus membranes or skin necrosis: No Has patient had a PCN reaction that required hospitalization No Has patient had a PCN reaction occurring within the last 10 years: No If all of the above answers are "NO", then may proceed with Cephalosporin use.   Antimicrobials this admission: levofloxacin 10/19 >>   Dose adjustments this admission:  Microbiology results: None  Thank you for allowing pharmacy to be a part of this patient's care.  Lenis Noon, PharmD Clinical Pharmacist 12/08/2015 1:47 PM

## 2015-12-08 NOTE — ED Triage Notes (Signed)
Pt reports weakness and new occurrence of hemorrhoids noticed last night with slight bleeding.Pt reports congestion, sore stomach & buttocks, and lack of appetite past 2 weeks. Pt has loose stools from medications. Pt has vomited twice in the past two days. Pt PMH cirrhosis of the liver and diabetes.

## 2015-12-08 NOTE — Progress Notes (Signed)
Chaplain on call responded to a call from a nurse who stated that patient and her family wanted an Advance Directives. Chaplain provided education about AD and prayed with patient and her family.

## 2015-12-08 NOTE — ED Notes (Addendum)
Pt to ED today with increased weakness and decreased appetite for approximately 1 week.  Pt with known cirrhosis of the liver.  Brother and sister-in-law with patient and also state she has increased "swelling in both lower legs and new hemarrhoids".

## 2015-12-08 NOTE — ED Provider Notes (Signed)
[facility] Emergency Department Provider Note   ____________________________________________   First MD Initiated Contact with Patient 12/08/15 [phone]     (approximate)  I have reviewed the triage vital signs and the nursing notes.   HISTORY  Chief Complaint Weakness   HPI Gloria Cole is a 70 y.o. female history of hep C and cirrhosis who is presenting to the emergency department today with several weeks of worsening weakness and failure to thrive. The family says that she is not eating and has recently had abdominal distention and abdominal pain. The family is also concerned because of hemorrhoids that appear new as well. The family denies the patient is or was a heavy drinker. No vomiting. No diarrhea. Patient is unable to specify her abdominal pain location. Says that her ankles have been hurting recently as well.Family denies any history of paracentesis.   Past Medical History:  Diagnosis Date  . Cirrhosis ([place])   . Diabetes ([place]) 2006  . Esophageal varices ([place]) March 2014  . GERD (gastroesophageal reflux disease)   . Hepatitis   . History of encephalopathy   . History of hepatitis C   . Hyperlipidemia associated with type 2 diabetes mellitus ([place])   . Hypertension   . Portal hypertensive gastropathy     Patient Active Problem List   Diagnosis Date Noted  . SBP (spontaneous bacterial peritonitis) ([place]) 12/08/2015  . Hepatic encephalopathy ([place]) 06/10/2015  . Acute hepatic encephalopathy 11/18/2014  . DM (diabetes mellitus) ([place]) 11/18/2014  . HTN (hypertension) 11/18/2014  . Nausea and vomiting 07/03/2012  . Gallstones 06/09/2012  . Hepatic cirrhosis ([place]) 06/09/2012    Past Surgical History:  Procedure Laterality Date  . ABDOMINAL HYSTERECTOMY  1994  . BREAST BIOPSY Right 1995   neg  . CHOLECYSTECTOMY  April 2014   Dr Bary Castilla  . COLONOSCOPY  2010  . COLONOSCOPY WITH PROPOFOL N/A 03/04/2015   Procedure: COLONOSCOPY WITH  PROPOFOL;  Surgeon: Hulen Luster, MD;  Location[facility] END[facility]COPY;  Service: Gastroenterology;  Laterality: N/A;  . ERCP  April 2014   Dr Candace Cruise  . ESOPHAGOGASTRODUODENOSCOPY (EGD) WITH PROPOFOL N/A 10/07/2014   Procedure: ESOPHAGOGASTRODUODENOSCOPY (EGD) WITH PROPOFOL;  Surgeon: Hulen Luster, MD;  Location: [facility] ENDOSCOPY;  Service: Gastroenterology;  Laterality: N/A;  . ESOPHAGOGASTRODUODENOSCOPY (EGD) WITH PROPOFOL N/A 12/16/2014   Procedure: ESOPHAGOGASTRODUODENOSCOPY (EGD) WITH PROPOFOL;  Surgeon: Hulen Luster, MD;  Location: [facility] ENDOSCOPY;  Service: Gastroenterology;  Laterality: N/A;  . ESOPHAGOGASTRODUODENOSCOPY (EGD) WITH PROPOFOL N/A 03/04/2015   Procedure: ESOPHAGOGASTRODUODENOSCOPY (EGD) WITH PROPOFOL;  Surgeon: Hulen Luster, MD;  Location: [facility] ENDOSCOPY;  Service: Gastroenterology;  Laterality: N/A;  . UPPER GI ENDOSCOPY  2014    Prior to Admission medications   Medication Sig Start Date End Date Taking? Authorizing Provider  Cobalamine Combinations (VITAMIN B12-FOLIC ACID) XX123456 MCG TABS Take 1 tablet by mouth daily.   Yes Historical Provider, MD  cyanocobalamin 500 MCG tablet Take 500 mcg by mouth every other day.    Yes Historical Provider, MD  Diclofenac Sodium 3 % GEL Place 2 g onto the skin 3 (three) times daily.   Yes Historical Provider, MD  fluticasone (FLONASE) 50 MCG/ACT nasal spray Place 2 sprays into both nostrils daily.   Yes Historical Provider, MD  furosemide (LASIX) 20 MG tablet Take 20 mg by mouth daily.   Yes Historical Provider, MD  gabapentin (NEURONTIN) 100 MG capsule Take 100 mg by mouth 2 (two) times daily.   Yes Historical Provider, MD  lactulose (CHRONULAC) 10 GM/15ML solution Take 45 mLs (30 g total) by mouth 3 (three) times daily. Patient taking differently: Take 30 g by mouth 4 (four) times daily.  11/19/14  Yes Srikar Sudini, MD  metFORMIN (GLUCOPHAGE-XR) 500 MG 24 hr tablet Take 1,000 mg by mouth daily with breakfast.    Yes Historical Provider, MD  Multiple Vitamin  (MULTIVITAMIN WITH MINERALS) TABS tablet Take 1 tablet by mouth daily.   Yes Historical Provider, MD  nadolol (CORGARD) 20 MG tablet Take 20 mg by mouth daily.    Yes Historical Provider, MD  ondansetron (ZOFRAN-ODT) 4 MG disintegrating tablet Take 4 mg by mouth every 8 (eight) hours as needed for nausea or vomiting.   Yes Historical Provider, MD  pantoprazole (PROTONIX) 40 MG tablet Take 40 mg by mouth daily.    Yes Historical Provider, MD  potassium chloride (K-DUR,KLOR-CON) 10 MEQ tablet Take 10 mEq by mouth daily.   Yes Historical Provider, MD  pravastatin (PRAVACHOL) 40 MG tablet Take 40 mg by mouth at bedtime.    Yes Historical Provider, MD  promethazine (PHENERGAN) 25 MG tablet Take 25 mg by mouth every 4 (four) hours as needed for nausea or vomiting.   Yes Historical Provider, MD  rifaximin (XIFAXAN) 550 MG TABS tablet Take 550 mg by mouth 2 (two) times daily.   Yes Historical Provider, MD  spironolactone (ALDACTONE) 50 MG tablet Take 50 mg by mouth daily.    Yes Historical Provider, MD    Allergies Penicillins  Family History  Problem Relation Age of Onset  . Diabetes Sister   . Diabetes Brother   . CAD Mother   . CVA Father   . Hypertension Father   . Diabetes Father     Social History Social History  Substance Use Topics  . Smoking status: Current Some Day Smoker    Packs/day: 0.25    Years: 30.00    Types: Cigarettes  . Smokeless tobacco: Never Used  . Alcohol use No     Comment: occasionally    Review of Systems Constitutional: No fever/chills Eyes: No visual changes. ENT: No sore throat. Cardiovascular: Denies chest pain. Respiratory: Denies shortness of breath. Gastrointestinal:  No nausea, no vomiting.  No diarrhea.  No constipation. Genitourinary: Negative for dysuria. Musculoskeletal: Negative for back pain. Skin: Negative for rash. Neurological: Negative for headaches, focal weakness or numbness.  10-point ROS otherwise  negative.  ____________________________________________   PHYSICAL EXAM:  VITAL SIGNS: ED Triage Vitals  Enc Vitals Group     BP 12/08/15 0900 110/60     Pulse Rate 12/08/15 0906 77     Resp 12/08/15 0900 17     Temp 12/08/15 0906 98 F (36.7 C)     Temp Source 12/08/15 0906 Oral     SpO2 12/08/15 0906 95 %     Weight 12/08/15 0907 135 lb (61.2 kg)     Height 12/08/15 0907 5\' 6"  (1.676 m)     Head Circumference --      Peak Flow --      Pain Score 12/08/15 0907 8     Pain Loc --      Pain Edu? --      Excl. in [place]? --     Constitutional: Alert and oriented.  Appears ill and weak. Skin is pale. Slow to respond to questions but does answer appropriately. Eyes: Conjunctivae are normal. PERRL. EOMI. Head: Atraumatic. Nose: No congestion/rhinnorhea. Mouth/Throat: Mucous membranes are moist.   Neck: No stridor.  Cardiovascular: Normal rate, regular rhythm. Grossly normal heart sounds.   Respiratory: Normal respiratory effort.  No retractions. Lungs CTAB. Gastrointestinal: Soft with mild diffuse tenderness palpation and mild distention without a tense and severely distended abdomen. No CVA tenderness. Negative for asterixis. Rectal exam, externally with multiple external hemorrhoids which do not appear thrombosed. Digital rectal exam with brown stool which is weakly heme positive. Musculoskeletal: No lower extremity tenderness nor edema.  No joint effusions. Neurologic:  Normal speech and language. No gross focal neurologic deficits are appreciated.  Skin:  Skin is warm, dry and intact. No rash noted. Psychiatric: Mood and affect are normal. Speech and behavior are normal.  ____________________________________________   LABS (all labs ordered are listed, but only abnormal results are displayed)  Labs Reviewed  CBC WITH DIFFERENTIAL/PLATELET - Abnormal; Notable for the following:       Result Value   MCV 103.7 (*)    MCH 35.0 (*)    RDW 16.8 (*)    Platelets 78 (*)     Neutro Abs 6.6 (*)    Monocytes Absolute 1.1 (*)    All other components within normal limits  URINALYSIS COMPLETEWITH MICROSCOPIC ([facility] ONLY) - Abnormal; Notable for the following:    Color, Urine AMBER (*)    APPearance CLEAR (*)    Ketones, ur TRACE (*)    Squamous Epithelial / LPF 0-5 (*)    All other components within normal limits  LACTIC ACID, PLASMA - Abnormal; Notable for the following:    Lactic Acid, Venous 3.7 (*)    All other components within normal limits  COMPREHENSIVE METABOLIC PANEL - Abnormal; Notable for the following:    Potassium 6.5 (*)    CO2 17 (*)    Glucose, Bld 104 (*)    BUN 47 (*)    Creatinine, Ser 1.68 (*)    Calcium 8.5 (*)    Albumin 2.2 (*)    AST 61 (*)    Total Bilirubin 3.4 (*)    GFR calc non Af Amer 29 (*)    GFR calc Af Amer 34 (*)    All other components within normal limits  TROPONIN I - Abnormal; Notable for the following:    Troponin I 0.04 (*)    All other components within normal limits  AMMONIA  LIPASE, BLOOD  LACTIC ACID, PLASMA  PROTIME-INR  POTASSIUM   ____________________________________________  EKG  ED ECG REPORT I, Doran Stabler, the attending physician, personally viewed and interpreted this ECG.   Date: 12/08/2015  EKG Time: 0901  Rate: 72  Rhythm: normal sinus rhythm  Axis: normal  Intervals:none  ST&T Change: No ST segment elevation or depression. T-wave inversions in leads 3 and aVF.  ____________________________________________  RADIOLOGY DG Chest 2 View (Accession [id number]) (Order [id number])  Imaging  Date: 12/08/2015 Department: [facility] EMERGENCY DEPARTMENT Released By/Authorizing: Orbie Pyo, MD (auto-released)  Exam Information   Status Exam Begun  Exam Ended   Final [99] 12/08/2015 10:01 AM 12/08/2015 10:01 AM  PACS Images   Show images for DG Chest 2 View  Study Result   CLINICAL DATA:  Cough, congestion, SOB x 2wk, generalized  weakness, Vomited twice yesterday, diarrhea. Hx - HTN, Cirrhosis, diabetic, current 0.25ppd smoker  EXAM: CHEST  2 VIEW  COMPARISON:  06/10/2015  FINDINGS: Heart size is normal. There is perihilar peribronchial thickening. There is a dense band in the right lung, consistent with right upper lobe atelectasis. A density projects over the  left scapula and likely represents an EKG lead. Attention to this region is directed on follow-up studies. Surgical clips are noted in the upper abdomen.  IMPRESSION: 1. Bronchitic changes. 2. Right upper lobe atelectasis which warrants follow-up to exclude obstruction. 3. Suspect EKG lead causing the density in the left upper lobe. However a followup will be helpful to assess this region.   Electronically Signed   By: Nolon Nations M.D.   On: 12/08/2015 10:10     ____________________________________________   PROCEDURES  Procedure(s) performed:   Procedures  Critical Care performed:   ____________________________________________   INITIAL IMPRESSION / ASSESSMENT AND PLAN / ED COURSE  Pertinent labs & imaging results that were available during my care of the patient were reviewed by me and considered in my medical decision making (see chart for details).  ----------------------------------------- 12:10 PM on 12/08/2015 -----------------------------------------  Patient with elevated lactic acid. Suspect possible spontaneous bacterial peritonitis. Patient also with renal failure. Sepsis protocol initiated.  Patient will be admitted to the hospital. Signed out to Dr. Earleen Newport who recommended waiting for any antibiotic administration until we have the results of the patient's paracentesis. Patient and family were [facility]. Patient also with hyperkalemia but with a hemolyzed blood specimen. Orders placed by hospitalist.  Clinical Course     ____________________________________________   FINAL CLINICAL  IMPRESSION(S) / ED DIAGNOSES  Final diagnoses:  Abdominal pain  Weakness. Failure to thrive. Acute renal failure. Lactate meal. Abdominal pain. Ascites.    NEW MEDICATIONS STARTED DURING THIS VISIT:  New Prescriptions   No medications on file     Note:  This document was prepared using Dragon voice recognition software and may include unintentional dictation errors.    Orbie Pyo, MD 12/08/15 [phone]

## 2015-12-08 NOTE — H&P (Signed)
Patient is complaining of abdominal pain. Patient has a history of liver cirrhosis, only tylenol was ordered. Received orders for ultram. Will continue to monitor and assess.

## 2015-12-09 ENCOUNTER — Inpatient Hospital Stay: Payer: Medicare Other

## 2015-12-09 LAB — PROTEIN, BODY FLUID

## 2015-12-09 LAB — GLUCOSE, CAPILLARY
GLUCOSE-CAPILLARY: 122 mg/dL — AB (ref 65–99)
GLUCOSE-CAPILLARY: 141 mg/dL — AB (ref 65–99)
Glucose-Capillary: 105 mg/dL — ABNORMAL HIGH (ref 65–99)
Glucose-Capillary: 140 mg/dL — ABNORMAL HIGH (ref 65–99)
Glucose-Capillary: 117 mg/dL — ABNORMAL HIGH (ref 65–99)

## 2015-12-09 LAB — LACTATE DEHYDROGENASE, PLEURAL OR PERITONEAL FLUID: LD FL: 25 U/L — AB (ref 3–23)

## 2015-12-09 LAB — BASIC METABOLIC PANEL
ANION GAP: 5 (ref 5–15)
BUN: 47 mg/dL — AB (ref 6–20)
CALCIUM: 8.4 mg/dL — AB (ref 8.9–10.3)
CO2: 18 mmol/L — ABNORMAL LOW (ref 22–32)
Chloride: 116 mmol/L — ABNORMAL HIGH (ref 101–111)
Creatinine, Ser: 1.49 mg/dL — ABNORMAL HIGH (ref 0.44–1.00)
GFR calc Af Amer: 39 mL/min — ABNORMAL LOW (ref >60)
GFR, EST NON AFRICAN AMERICAN: 34 mL/min — AB (ref >60)
GLUCOSE: 108 mg/dL — AB (ref 65–99)
Potassium: 5 mmol/L (ref 3.5–5.1)
Sodium: 139 mmol/L (ref 135–145)

## 2015-12-09 LAB — CBC
HCT: 37.5 % (ref 35.0–47.0)
Hemoglobin: 12.8 g/dL (ref 12.0–16.0)
MCH: 35.5 pg — ABNORMAL HIGH (ref 26.0–34.0)
MCHC: 34 g/dL (ref 32.0–36.0)
MCV: 104.4 fL — ABNORMAL HIGH (ref 80.0–100.0)
Platelets: 60 10*3/uL — ABNORMAL LOW (ref 150–440)
RBC: 3.59 MIL/uL — ABNORMAL LOW (ref 3.80–5.20)
RDW: 15.9 % — AB (ref 11.5–14.5)
WBC: 7.8 10*3/uL (ref 3.6–11.0)

## 2015-12-09 LAB — BODY FLUID CELL COUNT WITH DIFFERENTIAL
Eos, Fluid: 0 %
LYMPHS FL: 11 %
Monocyte-Macrophage-Serous Fluid: 5 %
Neutrophil Count, Fluid: 84 %
OTHER CELLS FL: 0 %
Total Nucleated Cell Count, Fluid: 322 cu mm

## 2015-12-09 LAB — LACTIC ACID, PLASMA: LACTIC ACID, VENOUS: 2.1 mmol/L — AB (ref 0.5–1.9)

## 2015-12-09 LAB — AMYLASE, BODY FLUID: AMYLASE FL: 28 U/L

## 2015-12-09 LAB — GLUCOSE, SEROUS FLUID: Glucose, Fluid: 140 mg/dL

## 2015-12-09 LAB — ALBUMIN, FLUID (OTHER): Albumin, Fluid: <1 g/dL

## 2015-12-09 MED ORDER — WITCH HAZEL-GLYCERIN EX PADS
MEDICATED_PAD | CUTANEOUS | Status: DC | PRN
  Administered 2015-12-10 (×2): via TOPICAL
  Filled 2015-12-09: qty 100

## 2015-12-09 MED ORDER — NYSTATIN 100000 UNIT/ML MT SUSP
5.0000 mL | Freq: Four times a day (QID) | OROMUCOSAL | Status: DC
  Administered 2015-12-09 – 2015-12-12 (×11): 500000 [IU] via OROMUCOSAL
  Filled 2015-12-09 (×11): qty 5

## 2015-12-09 NOTE — Procedures (Signed)
Successful US guided paracentesis yielding 2.8 L of serous ascitic fluid. Sample sent to laboratory as requested. EBL: None No immediate post procedural complications.   Ronny Bacon, MD Pager #: [phone]

## 2015-12-09 NOTE — Evaluation (Signed)
Physical Therapy Evaluation Patient Details Name: Gloria Cole MRN: [id number] DOB: May 25, 1945 Today's Date: 12/09/2015   History of Present Illness  Pt. is a 70 y.o. female presented to [place] with weakness, hemorrhoid, pain in abdomen, 5L fluid removed from abdomen 10/19. PMH: hep C, liver cirrhosis, DM,   Clinical Impression  Pt. Sleeping in bed upon arrival, responds to name. Pt. Lethargic throughout session, able to demonstrate correct orientation, demonstrates limited and slowed verbal responses. Pt. Demonstrates generalized weakness BUE strength grossly 3/5, BLE strength grossly 4/5 sensation intact although pt. Sensitive to touch B. Able to complete bed mobility with mod A, sit<>stand transfers with RW Min A and vc's for setup very slow movement initiation and transition. Able to ambulate about 1 foot to transfer to chair min A demonstrating shortened step length/height. Orthostatic BP values assessed throughout session; supine 103/55, sitting EOB 96/59 pt. Asymptomatic, standing 72/46 pt. Already taking steps to transfer to chair upon reading, pt. Seated in chair promptly pt. Did not report symptoms but upon questioning states "just a little dizzy", upon sitting in chair 106/44 pt. Asymptomatic nsg informed. Would benefit from skilled PT to address above deficits and promote optimal return to PLOF Recommend SNF placement upon d/c to follow up with further skilled PT needs.     Follow Up Recommendations SNF    Equipment Recommendations       Recommendations for Other Services       Precautions / Restrictions Precautions Precautions: Fall Restrictions Weight Bearing Restrictions: No      Mobility  Bed Mobility Overal bed mobility: Needs Assistance Bed Mobility: Supine to Sit     Supine to sit: Mod assist     General bed mobility comments: Pt. able to assist with LE movements and with bedrails with vc's requires assist for positioning of LE's, trunk movement to  EOB  Transfers Overall transfer level: Needs assistance Equipment used: Rolling walker (2 wheeled) Transfers: Sit to/from Stand Sit to Stand: Min assist         General transfer comment: Pt. requires vc's for setup, very slow initiation and transition of movements.   Ambulation/Gait Ambulation/Gait assistance: Min assist Ambulation Distance (Feet): 1 Feet Assistive device: Rolling walker (2 wheeled)       General Gait Details: Pt. able to take a few steps to chair from EOB, demonstrates flexed posturing, shortened step length/height B, step to pattern   Stairs            Wheelchair Mobility    Modified Rankin (Stroke Patients Only)       Balance Overall balance assessment: Needs assistance Sitting-balance support: Feet supported;Bilateral upper extremity supported Sitting balance-Leahy Scale: Good     Standing balance support: Bilateral upper extremity supported Standing balance-Leahy Scale: Fair                               Pertinent Vitals/Pain Pain Assessment: No/denies pain    Home Living Family/patient expects to be discharged to:: Private residence Living Arrangements: Alone (Pt. lives in her brothers home but he is only there intermittently) Available Help at Discharge: Family (intermittently)   Home Access: Stairs to enter Entrance Stairs-Rails: None Entrance Stairs-Number of Steps: 1 Home Layout: One level Home Equipment: None      Prior Function Level of Independence: Independent               Hand Dominance        Extremity/Trunk  Assessment   Upper Extremity Assessment: Generalized weakness (Pt. demonstrates grossly 3/5 strength in BUEs, sensation intact)           Lower Extremity Assessment: Generalized weakness (Pt. demonstrates grossly 4/5 strength BLE, light touch sensation intact, pt. reports tenderness to touch B)         Communication   Communication: No difficulties (Pt. demonstrates limited and  slowed responses )  Cognition Arousal/Alertness: Lethargic Behavior During Therapy: WFL for tasks assessed/performed Overall Cognitive Status: Difficult to assess                      General Comments      Exercises     Assessment/Plan    PT Assessment Patient needs continued PT services  PT Problem List Decreased strength;Decreased mobility;Decreased balance;Decreased knowledge of use of DME;Decreased activity tolerance;Decreased coordination          PT Treatment Interventions Gait training;Therapeutic activities;Functional mobility training;DME instruction;Stair training;Therapeutic exercise;Neuromuscular re-education;Balance training;Patient/family education    PT Goals (Current goals can be found in the Care Plan section)  Acute Rehab PT Goals PT Goal Formulation: Patient unable to participate in goal setting    Frequency Min 2X/week   Barriers to discharge Decreased caregiver support pt. lives alone, no one available for 24/7 supervision    Co-evaluation               End of Session Equipment Utilized During Treatment: Gait belt Activity Tolerance: Patient limited by lethargy;Treatment limited secondary to medical complications (Comment) (Further mobility deferred due to orthostasis) Patient left: in chair;with chair alarm set;with family/visitor present;with SCD's reapplied Nurse Communication: Other (comment) (orthostatics)         [id number] PT Time Calculation (min) (ACUTE ONLY): 33 min   Charges:         PT G Codes:        Kavir Savoca, SPT  12/09/15,3:21 PM

## 2015-12-09 NOTE — Plan of Care (Signed)
Problem: Activity: Goal: Risk for activity intolerance will decrease Outcome: Not Progressing Patient is weak, and has a poor appetite. Dietary consult has been placed. Encourage patient to eat when meals are available. Offer snacks between meals.

## 2015-12-09 NOTE — Progress Notes (Signed)
Initial Nutrition Assessment  DOCUMENTATION CODES:   Not applicable  INTERVENTION:   -RD will follow for diet advancement and supplement as appropriate -Continue MVI  NUTRITION DIAGNOSIS:   Inadequate oral intake related to altered GI function as evidenced by NPO status.  GOAL:   Patient will meet greater than or equal to 90% of their needs  MONITOR:   Diet advancement, Labs, Weight trends, Skin, I & O's  REASON FOR ASSESSMENT:   Consult Assessment of nutrition requirement/status  ASSESSMENT:   Gloria Cole  is a 70 y.o. female with a known history of Cirrhosis of the liver. Patient states that the family brought her in because she has some hemorrhoids. Family then states that her abdomen is becoming more swollen and her ankles are swollen and sore. She's not been eating for the last couple weeks. They have a hard time giving her medications. She's not been walking recently. She is not stable on her feet when she does walk. She's had some intermittent nausea and vomiting and abdominal pain. Patient is not the best historian but does answer questions appropriately. In the ER she was found to be in acute kidney injury with hyperkalemia and hospitalist services were contacted for further evaluation  Pt admitted with abdominal pain with cirrhosis and ascites.   Pt sleeping soundly at time of visit and did not arouse when name was called. No family present to provide additional hx. Unable to complete Nutrition-Focused physical exam at this time, given pt positioning and covered with multiple blankets.   Per RN and MD, pt is scheduled for paracentesis today. She is currently NPO.   Pt was on a soft diet this morning, however, with very poor meal intake. PO: 0% per doc flowsheets. Observed breakfast tray, which was untouched.   Wt hx reviewed. Noted pt has experienced a 4.3% wt loss over the past 6 months. However, suspect ascites may be masking true weight loss.   Given poor  oral intake and increased nutrient needs related to cirrhosis, suspect pt may be malnourished, however, unable to identify at this time. RD will add supplements once diet is advanced.   Medications reviewed and include MVI and vitamin B-12.  Labs reviewed: CBGS: 105-210.  Diet Order:  Diet NPO time specified Except for: Sips with Meds, Ice Chips  Skin:  Reviewed, no issues  Last BM:  12/09/15  Height:   Ht Readings from Last 1 Encounters:  12/08/15 5\' 6"  (1.676 m)    Weight:   Wt Readings from Last 1 Encounters:  12/08/15 135 lb (61.2 kg)    Ideal Body Weight:  59.1 kg  BMI:  Body mass index is 21.79 kg/m.  Estimated Nutritional Needs:   Kcal:  1600-1800  Protein:  80-95 grams  Fluid:  1.6-1.8 L  EDUCATION NEEDS:   No education needs identified at this time  [name] A. Jimmye Norman, RD, LDN, CDE Pager: [phone] After hours Pager: [phone]

## 2015-12-09 NOTE — Progress Notes (Signed)
Pharmacy Antibiotic Note  Gloria Cole is a 70 y.o. female admitted on 12/08/2015 with SBP.  Pharmacy has been consulted for levofloxacin dosing (patient has a penicillin allergy).  Plan: Levofloxacin 750 mg IV q48h   F/u CX from paracentesis  Height: 5\' 6"  (167.6 cm) Weight: 135 lb (61.2 kg) IBW/kg (Calculated) : 59.3  Temp (24hrs), Avg:98.1 F (36.7 C), Min:98 F (36.7 C), Max:98.1 F (36.7 C)   Recent Labs Lab 12/08/15 0914 12/08/15 1004 12/08/15 1005 12/09/15 0449  WBC 10.0  --   --  7.8  CREATININE  --   --  1.68* 1.49*  LATICACIDVEN  --  3.7*  --  2.1*    Estimated Creatinine Clearance: 31.9 mL/min (by C-G formula based on SCr of 1.49 mg/dL (H)).    Allergies  Allergen Reactions  . Penicillins Swelling and Other (See Comments)    Reaction:  Facial swelling  Has patient had a PCN reaction causing immediate rash, facial/tongue/throat swelling, SOB or lightheadedness with hypotension: Yes Has patient had a PCN reaction causing severe rash involving mucus membranes or skin necrosis: No Has patient had a PCN reaction that required hospitalization No Has patient had a PCN reaction occurring within the last 10 years: No If all of the above answers are "NO", then may proceed with Cephalosporin use.   Antimicrobials this admission: levofloxacin 10/19 >>  Diflucan 10/19>> 10/20 Nystatin po 10/20  Dose adjustments this admission:  Microbiology results:  10/20- Body fluid cx from paracentesis Pending  Thank you for allowing pharmacy to be a part of this patient's care.  Noralee Space, PharmD Clinical Pharmacist 12/09/2015 4:40 PM

## 2015-12-09 NOTE — Progress Notes (Signed)
CH made a follow up visit with Pt and family concerning the AD. After a brief conversation this [place] assessment is that the Pt is not fully herself and does not recommend the signing of the AD at this time. I suggest it be revisited when the Pt is mentally present. [place]anna provided a silent prayer at the door. CH is available for follow up as needed.     12/09/15 1300  Clinical Encounter Type  Visited With Patient and family together  Visit Type Initial;Spiritual support;Other (Comment) (AD)  Referral From Nurse  Spiritual Encounters  Spiritual Needs Literature;Prayer

## 2015-12-09 NOTE — Progress Notes (Signed)
[place] at [place] NAME: Gloria Cole    MR#:  [id number]  DATE OF BIRTH:  Oct 27, 1945  SUBJECTIVE:  CHIEF COMPLAINT:  Pt is reporting abd distension and nausea, for paracentsis today. Brother and niece at bed side  REVIEW OF SYSTEMS:  CONSTITUTIONAL: No fever, fatigue or weakness.  EYES: No blurred or double vision.  EARS, NOSE, AND THROAT: No tinnitus or ear pain.  RESPIRATORY: No cough, shortness of breath, wheezing or hemoptysis.  CARDIOVASCULAR: No chest pain, orthopnea, edema.  GASTROINTESTINAL:has  Nausea, denies vomiting, diarrhea , reports diffuse abdominal pain and distension.  GENITOURINARY: No dysuria, hematuria.  ENDOCRINE: No polyuria, nocturia,  HEMATOLOGY: No anemia, easy bruising or bleeding SKIN: No rash or lesion. MUSCULOSKELETAL: No joint pain or arthritis.   NEUROLOGIC: No tingling, numbness, weakness.  PSYCHIATRY: No anxiety or depression.   DRUG ALLERGIES:   Allergies  Allergen Reactions  . Penicillins Swelling and Other (See Comments)    Reaction:  Facial swelling  Has patient had a PCN reaction causing immediate rash, facial/tongue/throat swelling, SOB or lightheadedness with hypotension: Yes Has patient had a PCN reaction causing severe rash involving mucus membranes or skin necrosis: No Has patient had a PCN reaction that required hospitalization No Has patient had a PCN reaction occurring within the last 10 years: No If all of the above answers are "NO", then may proceed with Cephalosporin use.    VITALS:  Blood pressure (!) 106/50, pulse 72, temperature 98 F (36.7 C), resp. rate 16, height 5\' 6"  (1.676 m), weight 61.2 kg (135 lb), SpO2 99 %.  PHYSICAL EXAMINATION:  GENERAL:  70 y.o.-year-old patient lying in the bed with no acute distress.  EYES: Pupils equal, round, reactive to light and accommodation. No scleral icterus. Extraocular muscles intact.  HEENT: Head atraumatic, normocephalic.  Oropharynx and nasopharynx clear.  NECK:  Supple, no jugular venous distention. No thyroid enlargement, no tenderness.  LUNGS: Normal breath sounds bilaterally, no wheezing, rales,rhonchi or crepitation. No use of accessory muscles of respiration.  CARDIOVASCULAR: S1, S2 normal. No murmurs, rubs, or gallops.  ABDOMEN: Soft, gen tender, no rebound tenderness, nondistended. Bowel sounds present. No organomegaly or mass.  EXTREMITIES: No pedal edema, cyanosis, or clubbing.  NEUROLOGIC: Cranial nerves II through XII are intact. Muscle strength 5/5 in all extremities. Sensation intact. Gait not checked.  PSYCHIATRIC: The patient is alert and oriented x 3.  SKIN: No obvious rash, lesion, or ulcer.    LABORATORY PANEL:   CBC  Recent Labs Lab 12/09/15 0449  WBC 7.8  HGB 12.8  HCT 37.5  PLT 60*   ------------------------------------------------------------------------------------------------------------------  Chemistries   Recent Labs Lab 12/08/15 1005  12/09/15 0449  NA 136  --  139  K 6.5*  < > 5.0  CL 110  --  116*  CO2 17*  --  18*  GLUCOSE 104*  --  108*  BUN 47*  --  47*  CREATININE 1.68*  --  1.49*  CALCIUM 8.5*  --  8.4*  AST 61*  --   --   ALT 32  --   --   ALKPHOS 102  --   --   BILITOT 3.4*  --   --   < > = values in this interval not displayed. ------------------------------------------------------------------------------------------------------------------  Cardiac Enzymes  Recent Labs Lab 12/08/15 1005  TROPONINI 0.04*   ------------------------------------------------------------------------------------------------------------------  RADIOLOGY:  Dg Chest 2 View  Result Date: 12/08/2015 CLINICAL DATA:  Cough, congestion, SOB  x 2wk, generalized weakness, Vomited twice yesterday, diarrhea. Hx - HTN, Cirrhosis, diabetic, current 0.25ppd smoker EXAM: CHEST  2 VIEW COMPARISON:  06/10/2015 FINDINGS: Heart size is normal. There is perihilar peribronchial  thickening. There is a dense band in the right lung, consistent with right upper lobe atelectasis. A density projects over the left scapula and likely represents an EKG lead. Attention to this region is directed on follow-up studies. Surgical clips are noted in the upper abdomen. IMPRESSION: 1. Bronchitic changes. 2. Right upper lobe atelectasis which warrants follow-up to exclude obstruction. 3. Suspect EKG lead causing the density in the left upper lobe. However a followup will be helpful to assess this region. Electronically Signed   By: Nolon Nations M.D.   On: 12/08/2015 10:10   US Paracentesis  Result Date: 12/09/2015 INDICATION: Symptomatic ascites. Please perform ultrasound-guided paracentesis for diagnostic and therapeutic purposes EXAM: ULTRASOUND-GUIDED PARACENTESIS COMPARISON:  Right upper quadrant abdominal ultrasound - 12/07/2015 MEDICATIONS: None. COMPLICATIONS: None immediate. TECHNIQUE: Informed written consent was obtained from the patient after a discussion of the risks, benefits and alternatives to treatment. A timeout was performed prior to the initiation of the procedure. Initial ultrasound scanning demonstrates a moderate amount of ascites within the right lower abdominal quadrant. The right lower abdomen was prepped and draped in the usual sterile fashion. 1% lidocaine with epinephrine was used for local anesthesia. An ultrasound image was saved for documentation purposed. An 8 Fr Safe-T-Centesis catheter was introduced. The paracentesis was performed. The catheter was removed and a dressing was applied. The patient tolerated the procedure well without immediate post procedural complication. FINDINGS: A total of approximately 2.8 liters of serous fluid was removed. Samples were sent to the laboratory as requested by the clinical team. IMPRESSION: Successful ultrasound-guided paracentesis yielding 2.8 liters of peritoneal fluid. Electronically Signed   By: Sandi Mariscal M.D.   On:  12/09/2015 17:52    EKG:   Orders placed or performed during the hospital encounter of 12/08/15  . EKG 12-Lead  . EKG 12-Lead    ASSESSMENT AND PLAN:   1. Acute abdominal pain with cirrhosis and ascites. Likely SBP. S/p paracentesis- 2.8 lit extracted and sent to pathology Pt is allergic to PCN,on empiric levoflox . 2. Acute kidney injury with hyperkalemia and dehydration.  IVF  Stop potassium supplementation. Calcium gluconate, insulin D50 sodium bicarbonate and Kayexalate given at admission . Repeat potassium is 6.3--5.3--5.0 . 3. Thrush. IV Diflucan, change to po nystatin  4. Tobacco abuse. Smoking cessation counseling done by admitting physician Nicotine patch.  5. Cirrhosis of the liver, thrombocytopenia, elevated PT secondary to liver disease. Slight jaundice secondary to liver disease. Hold Lasix, spironolactone   6. Essential hypertension. Blood pressure on the lower side hold medications.  7. Type 2 diabetes mellitus. Sliding scale insulin for now. Glucophage contraindicated with acute kidney injury.  8. Hyperlipidemia hold off on cholesterol medication at this point.     All the records are reviewed and case discussed with Care Management/Social Workerr. Management plans discussed with the patient, family and they are in agreement.  CODE STATUS:dnr  TOTAL TIME TAKING CARE OF THIS PATIENT: 34 minutes.   POSSIBLE D/C IN 2-3 DAYS, DEPENDING ON CLINICAL CONDITION.  Note: This dictation was prepared with Dragon dictation along with smaller phrase technology. Any transcriptional errors that result from this process are unintentional.   Nicholes Mango M.D on 12/09/2015 at 6:46 PM  Between 7am to 6pm - Pager - [phone] After 6pm go to [email or web address] - password  [email or web address] [facility]  [place]  Office  [phone]  CC: Primary care physician; Kirk Ruths., MD

## 2015-12-10 LAB — GLUCOSE, CAPILLARY
GLUCOSE-CAPILLARY: 169 mg/dL — AB (ref 65–99)
GLUCOSE-CAPILLARY: 76 mg/dL (ref 65–99)
Glucose-Capillary: 120 mg/dL — ABNORMAL HIGH (ref 65–99)
Glucose-Capillary: 177 mg/dL — ABNORMAL HIGH (ref 65–99)

## 2015-12-10 LAB — MISC LABCORP TEST (SEND OUT): Labcorp test code: 19588

## 2015-12-10 LAB — C DIFFICILE QUICK SCREEN W PCR REFLEX
C DIFFICILE (CDIFF) TOXIN: NEGATIVE
C Diff antigen: NEGATIVE
C Diff interpretation: NOT DETECTED

## 2015-12-10 NOTE — NC FL2 (Signed)
[place] LEVEL OF CARE SCREENING TOOL     IDENTIFICATION  Patient Name: Gloria Cole Birthdate: 1945-02-27 Sex: female Admission Date (Current Location): 12/08/2015  [place] and [place] Number:  Engineering geologist and Address:  [facility], [address], [place], [place] [address]      Provider Number: [id number]  Attending Physician Name and Address:  Nicholes Mango, MD  Relative Name and Phone Number:       Current Level of Care: Hospital Recommended Level of Care: [place] Prior Approval Number:    Date Approved/Denied:   PASRR Number: [id number]  Discharge Plan: SNF    Current Diagnoses: Patient Active Problem List   Diagnosis Date Noted  . SBP (spontaneous bacterial peritonitis) ([place]) 12/08/2015  . Hepatic encephalopathy ([place]) 06/10/2015  . Acute hepatic encephalopathy 11/18/2014  . DM (diabetes mellitus) ([place]) 11/18/2014  . HTN (hypertension) 11/18/2014  . Nausea and vomiting 07/03/2012  . Gallstones 06/09/2012  . Hepatic cirrhosis ([place]) 06/09/2012    Orientation RESPIRATION BLADDER Height & Weight     Self, Time, Situation, Place  Normal Continent Weight: 135 lb (61.2 kg) Height:  5\' 6"  (167.6 cm)  BEHAVIORAL SYMPTOMS/MOOD NEUROLOGICAL BOWEL NUTRITION STATUS   (None)   Continent Diet (Clear Liquid)  AMBULATORY STATUS COMMUNICATION OF NEEDS Skin   Extensive Assist Verbally Other (Comment) (Puncture Right Abdomen  )                       Personal Care Assistance Level of Assistance  Bathing, Feeding, Dressing Bathing Assistance: Limited assistance Feeding assistance: Independent Dressing Assistance: Limited assistance     Functional Limitations Info  Sight, Hearing, Speech Sight Info: Adequate Hearing Info: Adequate Speech Info: Adequate    SPECIAL CARE FACTORS FREQUENCY  PT (By licensed PT)     PT Frequency: 5              Contractures      Additional  Factors Info  Code Status, Allergies, Isolation Precautions, Insulin Sliding Scale Code Status Info: DNR Allergies Info: Penicillins   Insulin Sliding Scale Info: insulin aspart (novoLOG) injection 0-5 Units 0-5 Units, Subcutaneous, Daily at bedtime  ; insulin aspart (novoLOG) injection 0-9 Units 0-9 Units, Subcutaneous, 3 times daily with meals   Isolation Precautions Info: Enteric precautions (UV disinfection)     Current Medications (12/10/2015):  This is the current hospital active medication list Current Facility-Administered Medications  Medication Dose Route Frequency Provider Last Rate Last Dose  . 0.9 %  sodium chloride infusion   Intravenous Continuous Loletha Grayer, MD 40 mL/hr at 12/09/15 2106    . acetaminophen (TYLENOL) tablet 650 mg  650 mg Oral Q6H PRN Loletha Grayer, MD       Or  . acetaminophen (TYLENOL) suppository 650 mg  650 mg Rectal Q6H PRN Loletha Grayer, MD      . cyanocobalamin tablet 500 mcg  500 mcg Oral Sabino Donovan, MD   500 mcg at 12/09/15 0947  . diclofenac sodium (VOLTAREN) 1 % transdermal gel 2 g  2 g Topical TID Loletha Grayer, MD   2 g at 12/09/15 2150  . fluticasone (FLONASE) 50 MCG/ACT nasal spray 2 spray  2 spray Each Nare Daily Loletha Grayer, MD      . vitamin B-12 (CYANOCOBALAMIN) tablet 500 mcg  500 mcg Oral Daily Napoleon Form, RPH   500 mcg at 12/10/15 [id number]   And  . folic acid (FOLVITE)  tablet 0.5 mg  0.5 mg Oral Daily Napoleon Form, RPH   0.5 mg at 12/10/15 1102  . gabapentin (NEURONTIN) capsule 100 mg  100 mg Oral BID Loletha Grayer, MD   100 mg at 12/10/15 1101  . insulin aspart (novoLOG) injection 0-5 Units  0-5 Units Subcutaneous QHS Loletha Grayer, MD   2 Units at 12/08/15 2225  . insulin aspart (novoLOG) injection 0-9 Units  0-9 Units Subcutaneous TID WC Loletha Grayer, MD   2 Units at 12/10/15 1155  . lactulose (CHRONULAC) 10 GM/15ML solution 30 g  30 g Oral TID Loletha Grayer, MD   30 g at 12/10/15 1102  .  levofloxacin (LEVAQUIN) IVPB 750 mg  750 mg Intravenous Q48H Lenis Noon, RPH   750 mg at 12/10/15 1431  . multivitamin with minerals tablet 1 tablet  1 tablet Oral Daily Loletha Grayer, MD   1 tablet at 12/10/15 1102  . nicotine (NICODERM CQ - dosed in mg/24 hours) patch 14 mg  14 mg Transdermal Daily Loletha Grayer, MD   14 mg at 12/10/15 1104  . nystatin (MYCOSTATIN) 100000 UNIT/ML suspension 500,000 Units  5 mL Mouth/Throat QID Nicholes Mango, MD   500,000 Units at 12/10/15 1431  . ondansetron (ZOFRAN-ODT) disintegrating tablet 4 mg  4 mg Oral Q8H PRN Loletha Grayer, MD      . pantoprazole (PROTONIX) EC tablet 40 mg  40 mg Oral Daily Loletha Grayer, MD   40 mg at 12/10/15 1101  . promethazine (PHENERGAN) tablet 25 mg  25 mg Oral Q4H PRN Loletha Grayer, MD      . rifaximin Doreene Nest) tablet 550 mg  550 mg Oral BID Loletha Grayer, MD   550 mg at 12/10/15 1101  . sodium chloride flush (NS) 0.9 % injection 3 mL  3 mL Intravenous Q12H Loletha Grayer, MD   Stopped at 12/09/15 1000  . traMADol (ULTRAM) tablet 50 mg  50 mg Oral Q8H PRN Fritzi Mandes, MD   50 mg at 12/10/15 1438  . witch hazel-glycerin (TUCKS) pad   Topical PRN Loletha Grayer, MD         Discharge Medications: Please see discharge summary for a list of discharge medications.  Relevant Imaging Results:  Relevant Lab Results:   Additional Information SSN [id number]  [place], LCSW

## 2015-12-10 NOTE — Progress Notes (Signed)
Pt was alert but clearly weak. Niece was present and named as Event organiser. PT initialed in designated places. Visit interuppted by bathroom break. CH will return.   12/10/15 1140  Clinical Encounter Type  Visited With Patient and family together  Visit Type Follow-up  Referral From Family  Spiritual Encounters  Spiritual Needs Emotional  Stress Factors  Patient Stress Factors Exhausted  Family Stress Factors None identified  Advance Directives (For Healthcare)  Does patient have an advance directive? No  Would patient like information on creating an advanced directive? Yes - Scientist, clinical (histocompatibility and immunogenetics) given

## 2015-12-10 NOTE — Progress Notes (Signed)
[place] at [place] NAME: Gloria Cole    MR#:  [id number]  DATE OF BIRTH:  17-Jun-1945  SUBJECTIVE:  CHIEF COMPLAINT:  Pt is reporting abd pain  and nausea, had paracentsis today.  niece at bed side  REVIEW OF SYSTEMS:  CONSTITUTIONAL: No fever, fatigue or weakness.  EYES: No blurred or double vision.  EARS, NOSE, AND THROAT: No tinnitus or ear pain.  RESPIRATORY: No cough, shortness of breath, wheezing or hemoptysis.  CARDIOVASCULAR: No chest pain, orthopnea, edema.  GASTROINTESTINAL:has  Nausea, denies vomiting, diarrhea , reports diffuse abdominal pain  GENITOURINARY: No dysuria, hematuria.  ENDOCRINE: No polyuria, nocturia,  HEMATOLOGY: No anemia, easy bruising or bleeding SKIN: No rash or lesion. MUSCULOSKELETAL: No joint pain or arthritis.   NEUROLOGIC: No tingling, numbness, weakness.  PSYCHIATRY: No anxiety or depression.   DRUG ALLERGIES:   Allergies  Allergen Reactions  . Penicillins Swelling and Other (See Comments)    Reaction:  Facial swelling  Has patient had a PCN reaction causing immediate rash, facial/tongue/throat swelling, SOB or lightheadedness with hypotension: Yes Has patient had a PCN reaction causing severe rash involving mucus membranes or skin necrosis: No Has patient had a PCN reaction that required hospitalization No Has patient had a PCN reaction occurring within the last 10 years: No If all of the above answers are "NO", then may proceed with Cephalosporin use.    VITALS:  Blood pressure 107/66, pulse 81, temperature 98.4 F (36.9 C), temperature source Oral, resp. rate 16, height 5\' 6"  (1.676 m), weight 61.2 kg (135 lb), SpO2 100 %.  PHYSICAL EXAMINATION:  GENERAL:  70 y.o.-year-old patient lying in the bed with no acute distress.  EYES: Pupils equal, round, reactive to light and accommodation. No scleral icterus. Extraocular muscles intact.  HEENT: Head atraumatic, normocephalic.  Oropharynx and nasopharynx clear.  NECK:  Supple, no jugular venous distention. No thyroid enlargement, no tenderness.  LUNGS: Normal breath sounds bilaterally, no wheezing, rales,rhonchi or crepitation. No use of accessory muscles of respiration.  CARDIOVASCULAR: S1, S2 normal. No murmurs, rubs, or gallops.  ABDOMEN: Soft, gen tender, no rebound tenderness, nondistended. Bowel sounds present. No organomegaly or mass.  EXTREMITIES: No pedal edema, cyanosis, or clubbing.  NEUROLOGIC: Cranial nerves II through XII are intact. Muscle strength 5/5 in all extremities. Sensation intact. Gait not checked.  PSYCHIATRIC: The patient is alert and oriented x 3.  SKIN: No obvious rash, lesion, or ulcer.    LABORATORY PANEL:   CBC  Recent Labs Lab 12/09/15 0449  WBC 7.8  HGB 12.8  HCT 37.5  PLT 60*   ------------------------------------------------------------------------------------------------------------------  Chemistries   Recent Labs Lab 12/08/15 1005  12/09/15 0449  NA 136  --  139  K 6.5*  < > 5.0  CL 110  --  116*  CO2 17*  --  18*  GLUCOSE 104*  --  108*  BUN 47*  --  47*  CREATININE 1.68*  --  1.49*  CALCIUM 8.5*  --  8.4*  AST 61*  --   --   ALT 32  --   --   ALKPHOS 102  --   --   BILITOT 3.4*  --   --   < > = values in this interval not displayed. ------------------------------------------------------------------------------------------------------------------  Cardiac Enzymes  Recent Labs Lab 12/08/15 1005  TROPONINI 0.04*   ------------------------------------------------------------------------------------------------------------------  RADIOLOGY:  US Paracentesis  Result Date: 12/09/2015 INDICATION: Symptomatic ascites. Please perform ultrasound-guided paracentesis for  diagnostic and therapeutic purposes EXAM: ULTRASOUND-GUIDED PARACENTESIS COMPARISON:  Right upper quadrant abdominal ultrasound - 12/07/2015 MEDICATIONS: None. COMPLICATIONS: None  immediate. TECHNIQUE: Informed written consent was obtained from the patient after a discussion of the risks, benefits and alternatives to treatment. A timeout was performed prior to the initiation of the procedure. Initial ultrasound scanning demonstrates a moderate amount of ascites within the right lower abdominal quadrant. The right lower abdomen was prepped and draped in the usual sterile fashion. 1% lidocaine with epinephrine was used for local anesthesia. An ultrasound image was saved for documentation purposed. An 8 Fr Safe-T-Centesis catheter was introduced. The paracentesis was performed. The catheter was removed and a dressing was applied. The patient tolerated the procedure well without immediate post procedural complication. FINDINGS: A total of approximately 2.8 liters of serous fluid was removed. Samples were sent to the laboratory as requested by the clinical team. IMPRESSION: Successful ultrasound-guided paracentesis yielding 2.8 liters of peritoneal fluid. Electronically Signed   By: Sandi Mariscal M.D.   On: 12/09/2015 17:52    EKG:   Orders placed or performed during the hospital encounter of 12/08/15  . EKG 12-Lead  . EKG 12-Lead    ASSESSMENT AND PLAN:   1. Acute abdominal pain with cirrhosis and ascites. Likely SBP. S/p paracentesis- 2.8 lit extracted and sent to pathology-paracentesis fluid clear , PMN <84; [id number].Cultures are pending Patient is afebrile but diffusely tender abdomen continue empiric antibiotics with levofloxacin for now, discussed with Dr. Vira Agar curb side, appreciate his recommendations Pt is allergic to PCN,on empiric levoflox . 2. Acute kidney injury with hyperkalemia and dehydration.  IVF  Stop potassium supplementation. Calcium gluconate, insulin D50 sodium bicarbonate and Kayexalate given at admission . Repeat potassium is 6.3--5.3--5.0 . 3. Thrush. IV Diflucan, change to po nystatin  4. Tobacco abuse. Smoking cessation counseling done by  admitting physician Nicotine patch.  5. Cirrhosis of the liver, thrombocytopenia, elevated PT secondary to liver disease. Slight jaundice secondary to liver disease. Hold Lasix, spironolactone   6. Essential hypertension. Blood pressure on the lower side hold medications.  7. Type 2 diabetes mellitus. Sliding scale insulin for now. Glucophage contraindicated with acute kidney injury.  8. Hyperlipidemia hold off on cholesterol medication at this point.  PT consult- SNF   All the records are reviewed and case discussed with Care Management/Social Workerr. Management plans discussed with the patient, family and they are in agreement.  CODE STATUS:dnr  TOTAL TIME TAKING CARE OF THIS PATIENT: 34 minutes.   POSSIBLE D/C IN 2-3 DAYS, DEPENDING ON CLINICAL CONDITION.  Note: This dictation was prepared with Dragon dictation along with smaller phrase technology. Any transcriptional errors that result from this process are unintentional.   Nicholes Mango M.D on 12/10/2015 at 1:44 PM  Between 7am to 6pm - Pager - [phone] After 6pm go to [email or web address] - password [email or web address] [place] [facility]  Office  [phone]  CC: Primary care physician; Kirk Ruths., MD

## 2015-12-11 LAB — COMPREHENSIVE METABOLIC PANEL
ALT: 30 U/L (ref 14–54)
ANION GAP: 6 (ref 5–15)
AST: 68 U/L — ABNORMAL HIGH (ref 15–41)
Albumin: 2 g/dL — ABNORMAL LOW (ref 3.5–5.0)
Alkaline Phosphatase: 98 U/L (ref 38–126)
BILIRUBIN TOTAL: 2.4 mg/dL — AB (ref 0.3–1.2)
BUN: 32 mg/dL — ABNORMAL HIGH (ref 6–20)
CALCIUM: 8.1 mg/dL — AB (ref 8.9–10.3)
CO2: 18 mmol/L — ABNORMAL LOW (ref 22–32)
Chloride: 113 mmol/L — ABNORMAL HIGH (ref 101–111)
Creatinine, Ser: 1.39 mg/dL — ABNORMAL HIGH (ref 0.44–1.00)
GFR, EST AFRICAN AMERICAN: 43 mL/min — AB (ref >60)
GFR, EST NON AFRICAN AMERICAN: 37 mL/min — AB (ref >60)
Glucose, Bld: 162 mg/dL — ABNORMAL HIGH (ref 65–99)
POTASSIUM: 4.1 mmol/L (ref 3.5–5.1)
Sodium: 137 mmol/L (ref 135–145)
TOTAL PROTEIN: 5.9 g/dL — AB (ref 6.5–8.1)

## 2015-12-11 LAB — GLUCOSE, CAPILLARY
GLUCOSE-CAPILLARY: 108 mg/dL — AB (ref 65–99)
GLUCOSE-CAPILLARY: 110 mg/dL — AB (ref 65–99)
GLUCOSE-CAPILLARY: 159 mg/dL — AB (ref 65–99)
Glucose-Capillary: 177 mg/dL — ABNORMAL HIGH (ref 65–99)

## 2015-12-11 NOTE — Clinical Social Work Note (Signed)
Clinical Social Work Assessment  Patient Details  Name: Gloria Cole MRN: [id number] Date of Birth: 10-03-1945  Date of referral:  12/11/15               Reason for consult:  Facility Placement                Permission sought to share information with:  Chartered certified accountant granted to share information::  Yes, Verbal Permission Granted  Name::        Agency::     Relationship::     Contact Information:     Housing/Transportation Living arrangements for the past 2 months:  Single Family Home Source of Information:  Other (Comment Required) (Patient's brother) Patient Interpreter Needed:  None Criminal Activity/Legal Involvement Pertinent to Current Situation/Hospitalization:  No - Comment as needed Significant Relationships:  Siblings, Other Family Members Lives with:  Self Do you feel safe going back to the place where you live?  Yes Need for family participation in patient care:  No (Coment)  Care giving concerns: STR   Social Worker assessment / plan:  CSW unable to rouse patient from sleep when visiting bedside. CSW contacted the patient's brother Vicente Serene for interview.  Vicente Serene gave verbal permission to conduct SNF bed search and indicated the only preference is that the facility be in [place].  At baseline, the patient is independent with ambulation, ADLs and all IADLs except for driving. The patient lives in her brother's home, but he is away all but 2-3 days a week. The patient is continent of both bladder and bowel prior to this visit.   Employment status:  Retired Nurse, adult PT Recommendations:  [place] / Referral to community resources:  [place]  Patient/Family's Response to care:  Patient's brother thanked CSW for assistance.  Patient/Family's Understanding of and Emotional Response to Diagnosis, Current Treatment, and Prognosis:  Patient's brother in  agreement with dc plan to SNF for STR.  Emotional Assessment Appearance:  Appears stated age Attitude/Demeanor/Rapport:   (Patient was sleeping) Affect (typically observed):   (Patient was sleeping) Orientation:   (Patient was sleeping) Alcohol / Substance use:  Never Used Psych involvement (Current and /or in the community):  No (Comment)  Discharge Needs  Concerns to be addressed:  Discharge Planning Concerns Readmission within the last 30 days:  No Current discharge risk:  None Barriers to Discharge:  Continued Medical Work up   [place] [employer], LCSW 12/11/2015, 3:41 PM

## 2015-12-11 NOTE — Progress Notes (Signed)
[facility] [place] NAME: Gloria Cole    MR#:  [id number]  DATE OF BIRTH:  1945/08/28  SUBJECTIVE:  CHIEF COMPLAINT:  Pt is still reporting abd pain  and nausea, had paracentsis 10/21.    REVIEW OF SYSTEMS:  CONSTITUTIONAL: No fever, fatigue or weakness.  EYES: No blurred or double vision.  EARS, NOSE, AND THROAT: No tinnitus or ear pain.  RESPIRATORY: No cough, shortness of breath, wheezing or hemoptysis.  CARDIOVASCULAR: No chest pain, orthopnea, edema.  GASTROINTESTINAL:has  Nausea, denies vomiting, diarrhea , reports diffuse abdominal pain  GENITOURINARY: No dysuria, hematuria.  ENDOCRINE: No polyuria, nocturia,  HEMATOLOGY: No anemia, easy bruising or bleeding SKIN: No rash or lesion. MUSCULOSKELETAL: No joint pain or arthritis.   NEUROLOGIC: No tingling, numbness, weakness.  PSYCHIATRY: No anxiety or depression.   DRUG ALLERGIES:   Allergies  Allergen Reactions  . Penicillins Swelling and Other (See Comments)    Reaction:  Facial swelling  Has patient had a PCN reaction causing immediate rash, facial/tongue/throat swelling, SOB or lightheadedness with hypotension: Yes Has patient had a PCN reaction causing severe rash involving mucus membranes or skin necrosis: No Has patient had a PCN reaction that required hospitalization No Has patient had a PCN reaction occurring within the last 10 years: No If all of the above answers are "NO", then may proceed with Cephalosporin use.    VITALS:  Blood pressure (!) 93/45, pulse 82, temperature 98.1 F (36.7 C), temperature source Oral, resp. rate 18, height 5\' 6"  (1.676 m), weight 61.2 kg (135 lb), SpO2 100 %.  PHYSICAL EXAMINATION:  GENERAL:  70 y.o.-year-old patient lying in the bed with no acute distress.  EYES: Pupils equal, round, reactive to light and accommodation. No scleral icterus. Extraocular muscles intact.  HEENT: Head atraumatic, normocephalic. Oropharynx and  nasopharynx clear.  NECK:  Supple, no jugular venous distention. No thyroid enlargement, no tenderness.  LUNGS: Normal breath sounds bilaterally, no wheezing, rales,rhonchi or crepitation. No use of accessory muscles of respiration.  CARDIOVASCULAR: S1, S2 normal. No murmurs, rubs, or gallops.  ABDOMEN: Soft, gen tender, no rebound tenderness, nondistended. Bowel sounds present. No organomegaly or mass.  EXTREMITIES: No pedal edema, cyanosis, or clubbing.  NEUROLOGIC: Cranial nerves II through XII are intact. Muscle strength 5/5 in all extremities. Sensation intact. Gait not checked.  PSYCHIATRIC: The patient is alert and oriented x 3.  SKIN: No obvious rash, lesion, or ulcer.    LABORATORY PANEL:   CBC  Recent Labs Lab 12/09/15 0449  WBC 7.8  HGB 12.8  HCT 37.5  PLT 60*   ------------------------------------------------------------------------------------------------------------------  Chemistries   Recent Labs Lab 12/08/15 1005  12/09/15 0449  NA 136  --  139  K 6.5*  < > 5.0  CL 110  --  116*  CO2 17*  --  18*  GLUCOSE 104*  --  108*  BUN 47*  --  47*  CREATININE 1.68*  --  1.49*  CALCIUM 8.5*  --  8.4*  AST 61*  --   --   ALT 32  --   --   ALKPHOS 102  --   --   BILITOT 3.4*  --   --   < > = values in this interval not displayed. ------------------------------------------------------------------------------------------------------------------  Cardiac Enzymes  Recent Labs Lab 12/08/15 1005  TROPONINI 0.04*   ------------------------------------------------------------------------------------------------------------------  RADIOLOGY:  US Paracentesis  Result Date: 12/09/2015 INDICATION: Symptomatic ascites. Please perform ultrasound-guided paracentesis for diagnostic  and therapeutic purposes EXAM: ULTRASOUND-GUIDED PARACENTESIS COMPARISON:  Right upper quadrant abdominal ultrasound - 12/07/2015 MEDICATIONS: None. COMPLICATIONS: None immediate. TECHNIQUE:  Informed written consent was obtained from the patient after a discussion of the risks, benefits and alternatives to treatment. A timeout was performed prior to the initiation of the procedure. Initial ultrasound scanning demonstrates a moderate amount of ascites within the right lower abdominal quadrant. The right lower abdomen was prepped and draped in the usual sterile fashion. 1% lidocaine with epinephrine was used for local anesthesia. An ultrasound image was saved for documentation purposed. An 8 Fr Safe-T-Centesis catheter was introduced. The paracentesis was performed. The catheter was removed and a dressing was applied. The patient tolerated the procedure well without immediate post procedural complication. FINDINGS: A total of approximately 2.8 liters of serous fluid was removed. Samples were sent to the laboratory as requested by the clinical team. IMPRESSION: Successful ultrasound-guided paracentesis yielding 2.8 liters of peritoneal fluid. Electronically Signed   By: Sandi Mariscal M.D.   On: 12/09/2015 17:52    EKG:   Orders placed or performed during the hospital encounter of 12/08/15  . EKG 12-Lead  . EKG 12-Lead    ASSESSMENT AND PLAN:   1. Acute abdominal pain with cirrhosis and ascites. Likely SBP. S/p paracentesis- 2.8 lit extracted and sent to pathology-paracentesis fluid clear , PMN <84; [id number].CulturesNo growth in 48 hours Patient is afebrile but diffusely tender abdomen continue empiric antibiotics with levofloxacin for now, discussed with Dr. Vira Agar curb side, appreciate his recommendations Pt is allergic to PCN,on empiric levoflox Advance diet as tolerated . 2. Acute kidney injury with hyperkalemia and dehydration.  IVF  Stop potassium supplementation. Calcium gluconate, insulin D50 sodium bicarbonate and Kayexalate given at admission . Repeat potassium is 6.3--5.3--5.0 . 3. Thrush. IV Diflucan, change to po nystatin  4. Tobacco abuse. Smoking cessation counseling  done by admitting physician Nicotine patch.  5. Cirrhosis of the liver, thrombocytopenia, elevated PT secondary to liver disease. Slight jaundice secondary to liver disease. Hold Lasix, spironolactone   6. Essential hypertension. Blood pressure on the lower side hold medications.  7. Type 2 diabetes mellitus. Sliding scale insulin for now. Glucophage contraindicated with acute kidney injury.  8. Hyperlipidemia hold off on cholesterol medication at this point.  PT consult- SNF follow-up with social worker anticipate in a.m.   All the records are reviewed and case discussed with Care Management/Social Workerr. Management plans discussed with the patient, family and they are in agreement.  CODE STATUS:dnr  TOTAL TIME TAKING CARE OF THIS PATIENT: 34 minutes.   POSSIBLE D/C IN 2-3 DAYS, DEPENDING ON CLINICAL CONDITION.  Note: This dictation was prepared with Dragon dictation along with smaller phrase technology. Any transcriptional errors that result from this process are unintentional.   Nicholes Mango M.D on 12/11/2015 at 12:40 PM  Between 7am to 6pm - Pager - [phone] After 6pm go to [email or web address] - password [email or web address] [facility]  Office  [phone]  CC: Primary care physician; Kirk Ruths., MD

## 2015-12-11 NOTE — Care Management Important Message (Signed)
Important Message  Patient Details  Name: Gloria Cole MRN: [id number] Date of Birth: April 25, 1945   Medicare Important Message Given:  Yes    Carles Collet, RN 12/11/2015, 9:06 AM

## 2015-12-12 LAB — COMPREHENSIVE METABOLIC PANEL
ALT: 29 U/L (ref 14–54)
ANION GAP: 4 — AB (ref 5–15)
AST: 59 U/L — ABNORMAL HIGH (ref 15–41)
Albumin: 1.8 g/dL — ABNORMAL LOW (ref 3.5–5.0)
Alkaline Phosphatase: 88 U/L (ref 38–126)
BILIRUBIN TOTAL: 2 mg/dL — AB (ref 0.3–1.2)
BUN: 32 mg/dL — ABNORMAL HIGH (ref 6–20)
CO2: 18 mmol/L — ABNORMAL LOW (ref 22–32)
Calcium: 8.1 mg/dL — ABNORMAL LOW (ref 8.9–10.3)
Chloride: 113 mmol/L — ABNORMAL HIGH (ref 101–111)
Creatinine, Ser: 1.4 mg/dL — ABNORMAL HIGH (ref 0.44–1.00)
GFR calc Af Amer: 42 mL/min — ABNORMAL LOW (ref >60)
GFR, EST NON AFRICAN AMERICAN: 37 mL/min — AB (ref >60)
Glucose, Bld: 125 mg/dL — ABNORMAL HIGH (ref 65–99)
POTASSIUM: 4.1 mmol/L (ref 3.5–5.1)
Sodium: 135 mmol/L (ref 135–145)
TOTAL PROTEIN: 5.4 g/dL — AB (ref 6.5–8.1)

## 2015-12-12 LAB — CBC
HCT: 36.3 % (ref 35.0–47.0)
Hemoglobin: 12.4 g/dL (ref 12.0–16.0)
MCH: 35.1 pg — AB (ref 26.0–34.0)
MCHC: 34.2 g/dL (ref 32.0–36.0)
MCV: 102.7 fL — ABNORMAL HIGH (ref 80.0–100.0)
PLATELETS: 50 10*3/uL — AB (ref 150–440)
RBC: 3.54 MIL/uL — ABNORMAL LOW (ref 3.80–5.20)
RDW: 16.3 % — ABNORMAL HIGH (ref 11.5–14.5)
WBC: 6.6 10*3/uL (ref 3.6–11.0)

## 2015-12-12 LAB — BODY FLUID CULTURE: CULTURE: NO GROWTH

## 2015-12-12 LAB — LACTIC ACID, PLASMA: LACTIC ACID, VENOUS: 1.5 mmol/L (ref 0.5–1.9)

## 2015-12-12 LAB — GLUCOSE, CAPILLARY
GLUCOSE-CAPILLARY: 160 mg/dL — AB (ref 65–99)
Glucose-Capillary: 119 mg/dL — ABNORMAL HIGH (ref 65–99)

## 2015-12-12 MED ORDER — NICOTINE 14 MG/24HR TD PT24: 14.0000 mg | MEDICATED_PATCH | Freq: Every day | TRANSDERMAL | 0 refills | Status: AC

## 2015-12-12 MED ORDER — LEVOFLOXACIN 500 MG PO TABS: 500.0000 mg | ORAL_TABLET | Freq: Every day | ORAL | 0 refills | Status: AC

## 2015-12-12 MED ORDER — NYSTATIN 100000 UNIT/ML MT SUSP: 5.0000 mL | Freq: Four times a day (QID) | OROMUCOSAL | 0 refills | Status: AC

## 2015-12-12 MED ORDER — TRAMADOL HCL 50 MG PO TABS: 50.0000 mg | ORAL_TABLET | Freq: Three times a day (TID) | ORAL | 0 refills | Status: AC | PRN

## 2015-12-12 MED ORDER — WITCH HAZEL-GLYCERIN EX PADS: MEDICATED_PAD | CUTANEOUS | 12 refills | Status: AC | PRN

## 2015-12-12 MED ORDER — FOLIC ACID 1 MG PO TABS: 0.5000 mg | ORAL_TABLET | Freq: Every day | ORAL | Status: AC

## 2015-12-12 MED ORDER — CYANOCOBALAMIN 500 MCG PO TABS: 500.0000 ug | ORAL_TABLET | Freq: Every day | ORAL | Status: DC

## 2015-12-12 NOTE — Clinical Social Work Note (Signed)
Patient to discharge today to [facility] and patient's brother to transport. Discharge information sent to [facility]. Shela Leff MSW,LCSW [phone]

## 2015-12-12 NOTE — Progress Notes (Signed)
Pt was alert and sitting in chair looking out window. Talked about having lots of company to visit her and especially her niece. CH offered prayer.   12/12/15 1000  Clinical Encounter Type  Visited With Patient  Visit Type Follow-up;Spiritual support  Spiritual Encounters  Spiritual Needs Prayer;Emotional  Stress Factors  Patient Stress Factors Health changes

## 2015-12-12 NOTE — Discharge Instructions (Signed)
Activity as tolerated per PT recommendations Diet-carb controlled Follow-up with primary care physician at the facility in 2-3 days  outpatient follow-up with gastroenterology in 3-4 weeks

## 2015-12-12 NOTE — Progress Notes (Signed)
SNF and Non-Emergent EMS Transport Benefits:  Number called: [phone] Rep: [facility] Reference Number: [id number]  [employer] Medicare Complete HMO Plan Two active as of 02/20/15 with no deductible.  Out of pocket max is $6700, of which $2102.07 met so far.  In-network SNF: $0 copay for days 1-20, a $160 daily copay for days 21-62, and a $0 copay for days 63-100.  Once out of pocket is reached, patient covered at 100% for remainder of 100 day benefit period. Per rep, all 100 days available at this time.  $0 copay for professional fees and 3 day hospital stay is not required.  Josem Kaufmann is required: [phone].    Non-emergent EMS transport: $250 copay for each one way medically necessary, Medicare covered trip.  Josem Kaufmann is required: [phone].

## 2015-12-12 NOTE — Discharge Summary (Signed)
[facility] [place] NAME: Gloria Cole    MR#:  [id number]  DATE OF BIRTH:  1945/08/23  DATE OF ADMISSION:  12/08/2015 ADMITTING PHYSICIAN: Loletha Grayer, MD  DATE OF DISCHARGE: 12/12/15 PRIMARY CARE PHYSICIAN: Kirk Ruths., MD    ADMISSION DIAGNOSIS:  Hyperkalemia [E87.5] Weakness [R53.1] Failure to thrive in adult [R62.7] Elevated lactic acid level [R79.89] Abdominal pain [R10.9]  DISCHARGE DIAGNOSIS:  Acute abdominal pain-probably SBP clinically Liver cirrhosis with ascites  SECONDARY DIAGNOSIS:   Past Medical History:  Diagnosis Date  . Cirrhosis ([place])   . Diabetes ([place]) 2006  . Esophageal varices ([place]) March 2014  . GERD (gastroesophageal reflux disease)   . Hepatitis   . History of encephalopathy   . History of hepatitis C   . Hyperlipidemia associated with type 2 diabetes mellitus ([place])   . Hypertension   . Portal hypertensive gastropathy     HOSPITAL COURSE:   1. Acute abdominal pain with cirrhosis and ascites. Likely SBP. S/p paracentesis- 2.8 lit extracted and sent to pathology-paracentesis fluid clear , PMN <84; [id number].CulturesNo growth in 48 hours Patient is afebrile but diffusely tender abdomen continue empiric antibiotics with levofloxacin for now, discussed with Dr. Vira Agar curb side, appreciate his recommendations, will discharge patient with by mouth levofloxacin for 5 more days Pt is allergic to PCN,on empiric levoflox Advanced diet and is tolerating well  . 2. Acute kidney injury with hyperkalemia and dehydration.  IVF  Stopped potassium supplementation. Calcium gluconate, insulin D50 sodium bicarbonate and Kayexalate given at admission . Repeat potassium is 6.3--5.3--5.0--4.1 . 3. Thrush. IV Diflucan, change to po nystatin  4. Tobacco abuse. Smoking cessation counseling done by admitting physician Nicotine patch.  5. Cirrhosis of the liver, thrombocytopenia, elevated PT  secondary to liver disease. Slight jaundice secondary to liver disease.  H0ld Lasix, as  blood pressure is low normal, resume spironolactone  6. Essential hypertension. Blood pressure on the lower side hold medications.  7. Type 2 diabetes mellitus. Sliding scale insulin for now. Glucophage contraindicated with acute kidney injury. Renal function is improving. Primary care provider at the facility to consider repeating BMP and monitor blood pressure closely  8. Hyperlipidemia hold off on cholesterol medication at this point.  PT consult- SNF . Patient and her family members are agreeable  DISCHARGE CONDITIONS:   fair  CONSULTS OBTAINED:  Treatment Team:  Nicholes Mango, MD   PROCEDURES paracentesis  DRUG ALLERGIES:   Allergies  Allergen Reactions  . Penicillins Swelling and Other (See Comments)    Reaction:  Facial swelling  Has patient had a PCN reaction causing immediate rash, facial/tongue/throat swelling, SOB or lightheadedness with hypotension: Yes Has patient had a PCN reaction causing severe rash involving mucus membranes or skin necrosis: No Has patient had a PCN reaction that required hospitalization No Has patient had a PCN reaction occurring within the last 10 years: No If all of the above answers are "NO", then may proceed with Cephalosporin use.    DISCHARGE MEDICATIONS:   Current Discharge Medication List    START taking these medications   Details  folic acid (FOLVITE) 1 MG tablet Take 0.5 tablets (0.5 mg total) by mouth daily.    levofloxacin (LEVAQUIN) 500 MG tablet Take 1 tablet (500 mg total) by mouth daily. Qty: 4 tablet, Refills: 0    nicotine (NICODERM CQ - DOSED IN MG/24 HOURS) 14 mg/24hr patch Place 1 patch (14 mg total) onto the skin daily. Qty: 28 patch,  Refills: 0    nystatin (MYCOSTATIN) 100000 UNIT/ML suspension Use as directed 5 mLs (500,000 Units total) in the mouth or throat 4 (four) times daily. Qty: 60 mL, Refills: 0    traMADol  (ULTRAM) 50 MG tablet Take 1 tablet (50 mg total) by mouth every 8 (eight) hours as needed for moderate pain (for pain). Qty: 30 tablet, Refills: 0    witch hazel-glycerin (TUCKS) pad Apply topically as needed for itching. Qty: 40 each, Refills: 12      CONTINUE these medications which have NOT CHANGED   Details  Cobalamine Combinations (VITAMIN B12-FOLIC ACID) [id number] MCG TABS Take 1 tablet by mouth daily.    cyanocobalamin 500 MCG tablet Take 500 mcg by mouth every other day.     Diclofenac Sodium 3 % GEL Place 2 g onto the skin 3 (three) times daily.    fluticasone (FLONASE) 50 MCG/ACT nasal spray Place 2 sprays into both nostrils daily.    gabapentin (NEURONTIN) 100 MG capsule Take 100 mg by mouth 2 (two) times daily.    lactulose (CHRONULAC) 10 GM/15ML solution Take 45 mLs (30 g total) by mouth 3 (three) times daily. Qty: 946 mL, Refills: 0    Multiple Vitamin (MULTIVITAMIN WITH MINERALS) TABS tablet Take 1 tablet by mouth daily.    nadolol (CORGARD) 20 MG tablet Take 20 mg by mouth daily.     ondansetron (ZOFRAN-ODT) 4 MG disintegrating tablet Take 4 mg by mouth every 8 (eight) hours as needed for nausea or vomiting.    pantoprazole (PROTONIX) 40 MG tablet Take 40 mg by mouth daily.     potassium chloride (K-DUR,KLOR-CON) 10 MEQ tablet Take 10 mEq by mouth daily.    pravastatin (PRAVACHOL) 40 MG tablet Take 40 mg by mouth at bedtime.     promethazine (PHENERGAN) 25 MG tablet Take 25 mg by mouth every 4 (four) hours as needed for nausea or vomiting.    rifaximin (XIFAXAN) 550 MG TABS tablet Take 550 mg by mouth 2 (two) times daily.    spironolactone (ALDACTONE) 50 MG tablet Take 50 mg by mouth daily.       STOP taking these medications     furosemide (LASIX) 20 MG tablet      metFORMIN (GLUCOPHAGE-XR) 500 MG 24 hr tablet          DISCHARGE INSTRUCTIONS:  Activity as tolerated per PT recommendations Diet-carb controlled Follow-up with primary care physician  at the facility in 2-3 days  outpatient follow-up with gastroenterology in 3-4 weeks    DIET:  Diabetic diet  DISCHARGE CONDITION:  Fair  ACTIVITY:  Activity as tolerated  OXYGEN:  Home Oxygen: No.   Oxygen Delivery: room air  DISCHARGE LOCATION:  nursing home   If you experience worsening of your admission symptoms, develop shortness of breath, life threatening emergency, suicidal or homicidal thoughts you must seek medical attention immediately by calling 911 or calling your MD immediately  if symptoms less severe.  You Must read complete instructions/literature along with all the possible adverse reactions/side effects for all the Medicines you take and that have been prescribed to you. Take any new Medicines after you have completely understood and accpet all the possible adverse reactions/side effects.   Please note  You were cared for by a hospitalist during your hospital stay. If you have any questions about your discharge medications or the care you received while you were in the hospital after you are discharged, you can call the unit and asked to  speak with the hospitalist on call if the hospitalist that took care of you is not available. Once you are discharged, your primary care physician will handle any further medical issues. Please note that NO REFILLS for any discharge medications will be authorized once you are discharged, as it is imperative that you return to your primary care physician (or establish a relationship with a primary care physician if you do not have one) for your aftercare needs so that they can reassess your need for medications and monitor your lab values.     Today  Chief Complaint  Patient presents with  . Weakness   Abdominal pain is better. Tolerating diet. Feeling weak and agreeable to go to a rehabilitation center  ROS:  CONSTITUTIONAL: Denies fevers, chills. Denies any fatigue, weakness.  EYES: Denies blurry vision, double vision,  eye pain. EARS, NOSE, THROAT: Denies tinnitus, ear pain, hearing loss. RESPIRATORY: Denies cough, wheeze, shortness of breath.  CARDIOVASCULAR: Denies chest pain, palpitations, edema.  GASTROINTESTINAL: Denies nausea, vomiting, diarrhea,  improved abdominal pain. Denies bright red blood per rectum. GENITOURINARY: Denies dysuria, hematuria. ENDOCRINE: Denies nocturia or thyroid problems. HEMATOLOGIC AND LYMPHATIC: Denies easy bruising or bleeding. SKIN: Denies rash or lesion. MUSCULOSKELETAL: Denies pain in neck, back, shoulder, knees, hips or arthritic symptoms.  NEUROLOGIC: Denies paralysis, paresthesias.  PSYCHIATRIC: Denies anxiety or depressive symptoms.   VITAL SIGNS:  Blood pressure 110/61, pulse 83, temperature 97.5 F (36.4 C), resp. rate 18, height 5\' 6"  (1.676 m), weight 61.2 kg (135 lb), SpO2 99 %.  I/O:    Intake/Output Summary (Last 24 hours) at 12/12/15 1338 Last data filed at 12/12/15 0500  Gross per 24 hour  Intake              803 ml  Output                0 ml  Net              803 ml    PHYSICAL EXAMINATION:  GENERAL:  70 y.o.-year-old patient lying in the bed with no acute distress.  EYES: Pupils equal, round, reactive to light and accommodation. No scleral icterus. Extraocular muscles intact.  HEENT: Head atraumatic, normocephalic. Oropharynx and nasopharynx clear.  NECK:  Supple, no jugular venous distention. No thyroid enlargement, no tenderness.  LUNGS: Normal breath sounds bilaterally, no wheezing, rales,rhonchi or crepitation. No use of accessory muscles of respiration.  CARDIOVASCULAR: S1, S2 normal. No murmurs, rubs, or gallops.  ABDOMEN: Soft, non-tender, non-distended. Bowel sounds present. No organomegaly or mass.  EXTREMITIES: No pedal edema, cyanosis, or clubbing.  NEUROLOGIC: Cranial nerves II through XII are intact. Muscle strength 5/5 in all extremities. Sensation intact. Gait not checked.  PSYCHIATRIC: The patient is alert and oriented x 3.   SKIN: No obvious rash, lesion, or ulcer.   DATA REVIEW:   CBC  Recent Labs Lab 12/12/15 0546  WBC 6.6  HGB 12.4  HCT 36.3  PLT 50*    Chemistries   Recent Labs Lab 12/12/15 0546  NA 135  K 4.1  CL 113*  CO2 18*  GLUCOSE 125*  BUN 32*  CREATININE 1.40*  CALCIUM 8.1*  AST 59*  ALT 29  ALKPHOS 88  BILITOT 2.0*    Cardiac Enzymes  Recent Labs Lab 12/08/15 1005  TROPONINI 0.04*    Microbiology Results  Results for orders placed or performed during the hospital encounter of 12/08/15  Body fluid culture     Status: None  Collection Time: 12/09/15  2:50 PM  Result Value Ref Range Status   Specimen Description PERITONEAL  Final   Special Requests NONE  Final   Gram Stain   Final    WBC PRESENT,BOTH PMN AND MONONUCLEAR NO ORGANISMS SEEN CYTOSPIN SMEAR    Culture   Final    No growth aerobically or anaerobically. Performed at [facility]    Report Status 12/12/2015 FINAL  Final  C difficile quick scan w PCR reflex     Status: None   Collection Time: 12/10/15  3:07 PM  Result Value Ref Range Status   C Diff antigen NEGATIVE NEGATIVE Final   C Diff toxin NEGATIVE NEGATIVE Final   C Diff interpretation No C. difficile detected.  Final    RADIOLOGY:  US Paracentesis  Result Date: 12/09/2015 INDICATION: Symptomatic ascites. Please perform ultrasound-guided paracentesis for diagnostic and therapeutic purposes EXAM: ULTRASOUND-GUIDED PARACENTESIS COMPARISON:  Right upper quadrant abdominal ultrasound - 12/07/2015 MEDICATIONS: None. COMPLICATIONS: None immediate. TECHNIQUE: Informed written consent was obtained from the patient after a discussion of the risks, benefits and alternatives to treatment. A timeout was performed prior to the initiation of the procedure. Initial ultrasound scanning demonstrates a moderate amount of ascites within the right lower abdominal quadrant. The right lower abdomen was prepped and draped in the usual sterile fashion. 1%  lidocaine with epinephrine was used for local anesthesia. An ultrasound image was saved for documentation purposed. An 8 Fr Safe-T-Centesis catheter was introduced. The paracentesis was performed. The catheter was removed and a dressing was applied. The patient tolerated the procedure well without immediate post procedural complication. FINDINGS: A total of approximately 2.8 liters of serous fluid was removed. Samples were sent to the laboratory as requested by the clinical team. IMPRESSION: Successful ultrasound-guided paracentesis yielding 2.8 liters of peritoneal fluid. Electronically Signed   By: Sandi Mariscal M.D.   On: 12/09/2015 17:52    EKG:   Orders placed or performed during the hospital encounter of 12/08/15  . EKG 12-Lead  . EKG 12-Lead      Management plans discussed with the patient, family and they are in agreement.  CODE STATUS:     Code Status Orders        Start     Ordered   12/08/15 1133  Do not attempt resuscitation (DNR)  Continuous    Question Answer Comment  In the event of cardiac or respiratory ARREST Do not call a "code blue"   In the event of cardiac or respiratory ARREST Do not perform Intubation, CPR, defibrillation or ACLS   In the event of cardiac or respiratory ARREST Use medication by any route, position, wound care, and other measures to relive pain and suffering. May use oxygen, suction and manual treatment of airway obstruction as needed for comfort.   Comments nurse may pronounce      12/08/15 1133    Code Status History    Date Active Date Inactive Code Status Order ID Comments User Context   06/10/2015  9:53 PM 06/13/2015  4:52 PM Full Code [id number]  Gladstone Lighter, MD Inpatient   11/18/2014  1:20 AM 11/19/2014  5:04 PM Full Code [id number]  Juluis Mire, MD ED      TOTAL TIME TAKING CARE OF THIS PATIENT: 45 minutes.   Note: This dictation was prepared with Dragon dictation along with smaller phrase technology. Any transcriptional  errors that result from this process are unintentional.   @[facility] @  on 12/12/2015 at 1:38 PM  Between 7am to 6pm - Pager - [phone]  After 6pm go to [email or web address] - password [email or web address] [facility]  Office  [phone]  CC: Primary care physician; Kirk Ruths., MD

## 2015-12-12 NOTE — Clinical Social Work Note (Signed)
CSW spoke with patient this morning regarding rehab and she stated she would do whatever her brother wanted her to do. Patient's brother arrived to the hospital and CSW informed both patient and her brother about the bed offers and patient's brother chose [facility]. Bed offer accepted. Shela Leff MSW,LCSW [phone]

## 2015-12-12 NOTE — Clinical Social Work Placement (Signed)
   CLINICAL SOCIAL WORK PLACEMENT  NOTE  Date:  12/12/2015  Patient Details  Name: Gloria Cole MRN: [id number] Date of Birth: Apr 16, 1945  Clinical Social Work is seeking post-discharge placement for this patient at the [place] level of care (*CSW will initial, date and re-position this form in  chart as items are completed):  Yes   Patient/family provided with [facility] Work Department's list of facilities offering this level of care within the geographic area requested by the patient (or if unable, by the patient's family).  Yes   Patient/family informed of their freedom to choose among providers that offer the needed level of care, that participate in Medicare, Medicaid or managed care program needed by the patient, have an available bed and are willing to accept the patient.      Patient/family informed of 's ownership interest in [facility] and [facility], as well as of the fact that they are under no obligation to receive care at these facilities.  PASRR submitted to EDS on       PASRR number received on       Existing PASRR number confirmed on 12/10/15     FL2 transmitted to all facilities in geographic area requested by pt/family on 12/11/15     FL2 transmitted to all facilities within larger geographic area on       Patient informed that his/her managed care company has contracts with or will negotiate with certain facilities, including the following:        Yes   Patient/family informed of bed offers received.  Patient chooses bed at  [facility])     Physician recommends and patient chooses bed at  [facility])    Patient to be transferred to  ([facility]) on 12/12/15.  Patient to be transferred to facility by  (brother)     Patient family notified on 12/12/15 of transfer.  Name of family member notified:   (Brother)     PHYSICIAN       Additional Comment:     _______________________________________________ Shela Leff, LCSW 12/12/2015, 3:16 PM

## 2015-12-12 NOTE — Progress Notes (Signed)
Report called to Maudie Mercury, Therapist, sports, at [facility].  Discharge instructions reviewed with patient and brother.  Understanding was verbalized and all questions were answered.  Patient discharged to rehab via wheelchair in stable condition escorted by nursing staff.

## 2015-12-14 ENCOUNTER — Other Ambulatory Visit
Admission: RE | Admit: 2015-12-14 | Discharge: 2015-12-14 | Disposition: A | Discharge status: Home / Self Care | Payer: Medicare Other | Source: Ambulatory Visit | Attending: Family Medicine | Admitting: Family Medicine

## 2015-12-14 DIAGNOSIS — R0602 Shortness of breath: Secondary | ICD-10-CM | POA: Diagnosis present

## 2015-12-16 LAB — LEGIONELLA PNEUMOPHILA SEROGP 1 UR AG: L. PNEUMOPHILA SEROGP 1 UR AG: NEGATIVE

## 2016-01-30 ENCOUNTER — Inpatient Hospital Stay
Admission: EM | Admit: 2016-01-30 | Discharge: 2016-02-20 | DRG: 871 | Disposition: E | Payer: Medicare Other | Attending: Internal Medicine | Admitting: Internal Medicine

## 2016-01-30 ENCOUNTER — Encounter: Payer: Self-pay | Admitting: *Deleted

## 2016-01-30 ENCOUNTER — Inpatient Hospital Stay: Payer: Medicare Other

## 2016-01-30 ENCOUNTER — Emergency Department: Payer: Medicare Other

## 2016-01-30 DIAGNOSIS — E7220 Disorder of urea cycle metabolism, unspecified: Secondary | ICD-10-CM

## 2016-01-30 DIAGNOSIS — B1920 Unspecified viral hepatitis C without hepatic coma: Secondary | ICD-10-CM | POA: Diagnosis present

## 2016-01-30 DIAGNOSIS — Z7189 Other specified counseling: Secondary | ICD-10-CM

## 2016-01-30 DIAGNOSIS — I8510 Secondary esophageal varices without bleeding: Secondary | ICD-10-CM | POA: Diagnosis present

## 2016-01-30 DIAGNOSIS — L89159 Pressure ulcer of sacral region, unspecified stage: Secondary | ICD-10-CM | POA: Diagnosis present

## 2016-01-30 DIAGNOSIS — Z515 Encounter for palliative care: Secondary | ICD-10-CM

## 2016-01-30 DIAGNOSIS — E860 Dehydration: Secondary | ICD-10-CM | POA: Diagnosis present

## 2016-01-30 DIAGNOSIS — Z9049 Acquired absence of other specified parts of digestive tract: Secondary | ICD-10-CM

## 2016-01-30 DIAGNOSIS — E440 Moderate protein-calorie malnutrition: Secondary | ICD-10-CM | POA: Diagnosis present

## 2016-01-30 DIAGNOSIS — D696 Thrombocytopenia, unspecified: Secondary | ICD-10-CM | POA: Diagnosis present

## 2016-01-30 DIAGNOSIS — N179 Acute kidney failure, unspecified: Secondary | ICD-10-CM | POA: Diagnosis present

## 2016-01-30 DIAGNOSIS — E1122 Type 2 diabetes mellitus with diabetic chronic kidney disease: Secondary | ICD-10-CM | POA: Diagnosis present

## 2016-01-30 DIAGNOSIS — Z66 Do not resuscitate: Secondary | ICD-10-CM | POA: Diagnosis present

## 2016-01-30 DIAGNOSIS — E875 Hyperkalemia: Secondary | ICD-10-CM | POA: Diagnosis present

## 2016-01-30 DIAGNOSIS — K7290 Hepatic failure, unspecified without coma: Secondary | ICD-10-CM

## 2016-01-30 DIAGNOSIS — R680 Hypothermia, not associated with low environmental temperature: Secondary | ICD-10-CM | POA: Diagnosis present

## 2016-01-30 DIAGNOSIS — N183 Chronic kidney disease, stage 3 (moderate): Secondary | ICD-10-CM | POA: Diagnosis present

## 2016-01-30 DIAGNOSIS — E876 Hypokalemia: Secondary | ICD-10-CM | POA: Diagnosis not present

## 2016-01-30 DIAGNOSIS — K279 Peptic ulcer, site unspecified, unspecified as acute or chronic, without hemorrhage or perforation: Secondary | ICD-10-CM | POA: Diagnosis present

## 2016-01-30 DIAGNOSIS — E785 Hyperlipidemia, unspecified: Secondary | ICD-10-CM | POA: Diagnosis present

## 2016-01-30 DIAGNOSIS — K7460 Unspecified cirrhosis of liver: Secondary | ICD-10-CM | POA: Diagnosis present

## 2016-01-30 DIAGNOSIS — E11649 Type 2 diabetes mellitus with hypoglycemia without coma: Secondary | ICD-10-CM | POA: Diagnosis not present

## 2016-01-30 DIAGNOSIS — R34 Anuria and oliguria: Secondary | ICD-10-CM | POA: Diagnosis present

## 2016-01-30 DIAGNOSIS — K766 Portal hypertension: Secondary | ICD-10-CM | POA: Diagnosis present

## 2016-01-30 DIAGNOSIS — K219 Gastro-esophageal reflux disease without esophagitis: Secondary | ICD-10-CM | POA: Diagnosis present

## 2016-01-30 DIAGNOSIS — R188 Other ascites: Secondary | ICD-10-CM | POA: Diagnosis present

## 2016-01-30 DIAGNOSIS — R0603 Acute respiratory distress: Secondary | ICD-10-CM | POA: Diagnosis present

## 2016-01-30 DIAGNOSIS — Z4659 Encounter for fitting and adjustment of other gastrointestinal appliance and device: Secondary | ICD-10-CM

## 2016-01-30 DIAGNOSIS — J690 Pneumonitis due to inhalation of food and vomit: Secondary | ICD-10-CM | POA: Diagnosis not present

## 2016-01-30 DIAGNOSIS — G9341 Metabolic encephalopathy: Secondary | ICD-10-CM | POA: Diagnosis not present

## 2016-01-30 DIAGNOSIS — R6521 Severe sepsis with septic shock: Secondary | ICD-10-CM | POA: Diagnosis present

## 2016-01-30 DIAGNOSIS — E8770 Fluid overload, unspecified: Secondary | ICD-10-CM | POA: Diagnosis not present

## 2016-01-30 DIAGNOSIS — R5383 Other fatigue: Secondary | ICD-10-CM | POA: Diagnosis not present

## 2016-01-30 DIAGNOSIS — K652 Spontaneous bacterial peritonitis: Secondary | ICD-10-CM | POA: Diagnosis not present

## 2016-01-30 DIAGNOSIS — J9600 Acute respiratory failure, unspecified whether with hypoxia or hypercapnia: Secondary | ICD-10-CM

## 2016-01-30 DIAGNOSIS — R140 Abdominal distension (gaseous): Secondary | ICD-10-CM

## 2016-01-30 DIAGNOSIS — E871 Hypo-osmolality and hyponatremia: Secondary | ICD-10-CM | POA: Diagnosis present

## 2016-01-30 DIAGNOSIS — Z833 Family history of diabetes mellitus: Secondary | ICD-10-CM

## 2016-01-30 DIAGNOSIS — Z452 Encounter for adjustment and management of vascular access device: Secondary | ICD-10-CM

## 2016-01-30 DIAGNOSIS — Z6825 Body mass index (BMI) 25.0-25.9, adult: Secondary | ICD-10-CM

## 2016-01-30 DIAGNOSIS — Z79899 Other long term (current) drug therapy: Secondary | ICD-10-CM

## 2016-01-30 DIAGNOSIS — A419 Sepsis, unspecified organism: Secondary | ICD-10-CM | POA: Diagnosis present

## 2016-01-30 DIAGNOSIS — R4182 Altered mental status, unspecified: Secondary | ICD-10-CM

## 2016-01-30 DIAGNOSIS — F17210 Nicotine dependence, cigarettes, uncomplicated: Secondary | ICD-10-CM | POA: Diagnosis present

## 2016-01-30 DIAGNOSIS — A408 Other streptococcal sepsis: Secondary | ICD-10-CM

## 2016-01-30 DIAGNOSIS — L8990 Pressure ulcer of unspecified site, unspecified stage: Secondary | ICD-10-CM | POA: Insufficient documentation

## 2016-01-30 DIAGNOSIS — Z8249 Family history of ischemic heart disease and other diseases of the circulatory system: Secondary | ICD-10-CM

## 2016-01-30 DIAGNOSIS — E872 Acidosis: Secondary | ICD-10-CM | POA: Diagnosis present

## 2016-01-30 DIAGNOSIS — I129 Hypertensive chronic kidney disease with stage 1 through stage 4 chronic kidney disease, or unspecified chronic kidney disease: Secondary | ICD-10-CM | POA: Diagnosis present

## 2016-01-30 DIAGNOSIS — K7682 Hepatic encephalopathy: Secondary | ICD-10-CM

## 2016-01-30 DIAGNOSIS — K3189 Other diseases of stomach and duodenum: Secondary | ICD-10-CM | POA: Diagnosis present

## 2016-01-30 DIAGNOSIS — E162 Hypoglycemia, unspecified: Secondary | ICD-10-CM

## 2016-01-30 LAB — CBC WITH DIFFERENTIAL/PLATELET
Basophils Absolute: 0 10*3/uL (ref 0–0.1)
Basophils Relative: 0 %
Eosinophils Absolute: 0.1 10*3/uL (ref 0–0.7)
Eosinophils Relative: 2 %
HEMATOCRIT: 37.6 % (ref 35.0–47.0)
HEMOGLOBIN: 12.7 g/dL (ref 12.0–16.0)
LYMPHS ABS: 1.5 10*3/uL (ref 1.0–3.6)
LYMPHS PCT: 26 %
MCH: 34.4 pg — AB (ref 26.0–34.0)
MCHC: 33.8 g/dL (ref 32.0–36.0)
MCV: 101.6 fL — ABNORMAL HIGH (ref 80.0–100.0)
MONOS PCT: 7 %
Monocytes Absolute: 0.4 10*3/uL (ref 0.2–0.9)
NEUTROS ABS: 3.8 10*3/uL (ref 1.4–6.5)
NEUTROS PCT: 65 %
Platelets: 101 10*3/uL — ABNORMAL LOW (ref 150–440)
RBC: 3.7 MIL/uL — ABNORMAL LOW (ref 3.80–5.20)
RDW: 16.8 % — ABNORMAL HIGH (ref 11.5–14.5)
WBC: 5.9 10*3/uL (ref 3.6–11.0)

## 2016-01-30 LAB — TROPONIN I: Troponin I: 0.06 ng/mL (ref <0.03)

## 2016-01-30 LAB — BASIC METABOLIC PANEL
Anion gap: 8 (ref 5–15)
BUN: 46 mg/dL — AB (ref 6–20)
CO2: 12 mmol/L — ABNORMAL LOW (ref 22–32)
CREATININE: 3.15 mg/dL — AB (ref 0.44–1.00)
Calcium: 7.3 mg/dL — ABNORMAL LOW (ref 8.9–10.3)
Chloride: 110 mmol/L (ref 101–111)
GFR calc Af Amer: 16 mL/min — ABNORMAL LOW (ref >60)
GFR, EST NON AFRICAN AMERICAN: 14 mL/min — AB (ref >60)
GLUCOSE: 241 mg/dL — AB (ref 65–99)
POTASSIUM: 4.1 mmol/L (ref 3.5–5.1)
SODIUM: 130 mmol/L — AB (ref 135–145)

## 2016-01-30 LAB — COMPREHENSIVE METABOLIC PANEL
ALBUMIN: 1.7 g/dL — AB (ref 3.5–5.0)
ALT: 22 U/L (ref 14–54)
AST: 62 U/L — AB (ref 15–41)
Alkaline Phosphatase: 118 U/L (ref 38–126)
Anion gap: 10 (ref 5–15)
BUN: 47 mg/dL — AB (ref 6–20)
CHLORIDE: 111 mmol/L (ref 101–111)
CO2: 11 mmol/L — ABNORMAL LOW (ref 22–32)
Calcium: 8 mg/dL — ABNORMAL LOW (ref 8.9–10.3)
Creatinine, Ser: 3.23 mg/dL — ABNORMAL HIGH (ref 0.44–1.00)
GFR calc Af Amer: 15 mL/min — ABNORMAL LOW (ref >60)
GFR calc non Af Amer: 13 mL/min — ABNORMAL LOW (ref >60)
Glucose, Bld: 49 mg/dL — ABNORMAL LOW (ref 65–99)
POTASSIUM: 5.7 mmol/L — AB (ref 3.5–5.1)
Sodium: 132 mmol/L — ABNORMAL LOW (ref 135–145)
Total Bilirubin: 2.1 mg/dL — ABNORMAL HIGH (ref 0.3–1.2)
Total Protein: 5.8 g/dL — ABNORMAL LOW (ref 6.5–8.1)

## 2016-01-30 LAB — AMMONIA: Ammonia: 116 umol/L — ABNORMAL HIGH (ref 9–35)

## 2016-01-30 LAB — APTT: aPTT: 41 seconds — ABNORMAL HIGH (ref 24–36)

## 2016-01-30 LAB — PROCALCITONIN: PROCALCITONIN: 0.19 ng/mL

## 2016-01-30 LAB — LACTIC ACID, PLASMA
LACTIC ACID, VENOUS: 2.1 mmol/L — AB (ref 0.5–1.9)
LACTIC ACID, VENOUS: 2.4 mmol/L — AB (ref 0.5–1.9)
LACTIC ACID, VENOUS: 3.6 mmol/L — AB (ref 0.5–1.9)

## 2016-01-30 LAB — PROTIME-INR
INR: 2.1
PROTHROMBIN TIME: 23.9 s — AB (ref 11.4–15.2)

## 2016-01-30 LAB — GLUCOSE, CAPILLARY
GLUCOSE-CAPILLARY: 212 mg/dL — AB (ref 65–99)
GLUCOSE-CAPILLARY: 212 mg/dL — AB (ref 65–99)
Glucose-Capillary: 171 mg/dL — ABNORMAL HIGH (ref 65–99)
Glucose-Capillary: 194 mg/dL — ABNORMAL HIGH (ref 65–99)

## 2016-01-30 LAB — MRSA PCR SCREENING: MRSA BY PCR: NEGATIVE

## 2016-01-30 LAB — INFLUENZA PANEL BY PCR (TYPE A & B)
INFLBPCR: NEGATIVE
Influenza A By PCR: NEGATIVE

## 2016-01-30 LAB — LIPASE, BLOOD: Lipase: 34 U/L (ref 11–51)

## 2016-01-30 MED ORDER — SODIUM CHLORIDE 0.9 % IV BOLUS (SEPSIS)
1000.0000 mL | Freq: Once | INTRAVENOUS | Status: AC
  Administered 2016-01-30: 1000 mL via INTRAVENOUS

## 2016-01-30 MED ORDER — DEXTROSE 5 % IV SOLN
2.0000 g | Freq: Once | INTRAVENOUS | Status: AC
  Administered 2016-01-30: 2 g via INTRAVENOUS
  Filled 2016-01-30: qty 2

## 2016-01-30 MED ORDER — FAMOTIDINE IN NACL 20-0.9 MG/50ML-% IV SOLN: 20.0000 mg | Freq: Two times a day (BID) | INTRAVENOUS | Status: DC

## 2016-01-30 MED ORDER — NOREPINEPHRINE BITARTRATE 1 MG/ML IV SOLN
0.0000 ug/min | Freq: Once | INTRAVENOUS | Status: AC
  Administered 2016-01-30: 10 ug/min via INTRAVENOUS
  Filled 2016-01-30: qty 4

## 2016-01-30 MED ORDER — IPRATROPIUM-ALBUTEROL 0.5-2.5 (3) MG/3ML IN SOLN
3.0000 mL | Freq: Four times a day (QID) | RESPIRATORY_TRACT | Status: DC
  Administered 2016-01-30: 3 mL via RESPIRATORY_TRACT
  Filled 2016-01-30: qty 3

## 2016-01-30 MED ORDER — LACTULOSE 10 GM/15ML PO SOLN
30.0000 g | Freq: Three times a day (TID) | ORAL | Status: DC
  Administered 2016-01-30 (×2): 30 g via ORAL
  Filled 2016-01-30 (×2): qty 60

## 2016-01-30 MED ORDER — VANCOMYCIN HCL IN DEXTROSE 750-5 MG/150ML-% IV SOLN
750.0000 mg | INTRAVENOUS | Status: DC
  Administered 2016-01-30: 750 mg via INTRAVENOUS
  Filled 2016-01-30: qty 150

## 2016-01-30 MED ORDER — IPRATROPIUM-ALBUTEROL 0.5-2.5 (3) MG/3ML IN SOLN
3.0000 mL | Freq: Two times a day (BID) | RESPIRATORY_TRACT | Status: DC
  Administered 2016-01-30 – 2016-02-02 (×7): 3 mL via RESPIRATORY_TRACT
  Filled 2016-01-30 (×7): qty 3

## 2016-01-30 MED ORDER — ENOXAPARIN SODIUM 40 MG/0.4ML ~~LOC~~ SOLN: 30.0000 mg | SUBCUTANEOUS | Status: DC

## 2016-01-30 MED ORDER — SODIUM CHLORIDE 0.9 % IV SOLN
500.0000 mg | Freq: Two times a day (BID) | INTRAVENOUS | Status: DC
  Administered 2016-01-30 – 2016-02-02 (×6): 500 mg via INTRAVENOUS
  Filled 2016-01-30 (×7): qty 0.5

## 2016-01-30 MED ORDER — INSULIN ASPART 100 UNIT/ML ~~LOC~~ SOLN
0.0000 [IU] | SUBCUTANEOUS | Status: DC
  Administered 2016-01-30 (×2): 3 [IU] via SUBCUTANEOUS
  Administered 2016-01-30 – 2016-01-31 (×2): 2 [IU] via SUBCUTANEOUS
  Administered 2016-01-31: 1 [IU] via SUBCUTANEOUS
  Administered 2016-01-31: 2 [IU] via SUBCUTANEOUS
  Administered 2016-01-31 (×3): 1 [IU] via SUBCUTANEOUS
  Administered 2016-02-01: 2 [IU] via SUBCUTANEOUS
  Administered 2016-02-01: 5 [IU] via SUBCUTANEOUS
  Administered 2016-02-01: 3 [IU] via SUBCUTANEOUS
  Administered 2016-02-01 (×2): 2 [IU] via SUBCUTANEOUS
  Administered 2016-02-02 (×2): 3 [IU] via SUBCUTANEOUS
  Administered 2016-02-02 (×2): 2 [IU] via SUBCUTANEOUS
  Filled 2016-01-30: qty 2
  Filled 2016-01-30: qty 1
  Filled 2016-01-30 (×3): qty 2
  Filled 2016-01-30: qty 3
  Filled 2016-01-30: qty 1
  Filled 2016-01-30: qty 2
  Filled 2016-01-30 (×2): qty 3
  Filled 2016-01-30: qty 2
  Filled 2016-01-30 (×2): qty 3
  Filled 2016-01-30: qty 2
  Filled 2016-01-30: qty 1
  Filled 2016-01-30: qty 5
  Filled 2016-01-30: qty 2
  Filled 2016-01-30: qty 1

## 2016-01-30 MED ORDER — LEVOFLOXACIN IN D5W 750 MG/150ML IV SOLN
750.0000 mg | Freq: Once | INTRAVENOUS | Status: AC
  Administered 2016-01-30: 750 mg via INTRAVENOUS
  Filled 2016-01-30: qty 150

## 2016-01-30 MED ORDER — HEPARIN SODIUM (PORCINE) 5000 UNIT/ML IJ SOLN
5000.0000 [IU] | Freq: Three times a day (TID) | INTRAMUSCULAR | Status: DC
  Administered 2016-01-30 – 2016-02-02 (×9): 5000 [IU] via SUBCUTANEOUS
  Filled 2016-01-30 (×10): qty 1

## 2016-01-30 MED ORDER — SODIUM CHLORIDE 0.9 % IV SOLN: INTRAVENOUS | Status: DC

## 2016-01-30 MED ORDER — DEXTROSE-NACL 5-0.9 % IV SOLN
INTRAVENOUS | Status: DC
  Administered 2016-01-30: 1000 mL via INTRAVENOUS

## 2016-01-30 MED ORDER — VANCOMYCIN HCL IN DEXTROSE 750-5 MG/150ML-% IV SOLN
750.0000 mg | INTRAVENOUS | Status: DC
  Filled 2016-01-30: qty 150

## 2016-01-30 MED ORDER — VANCOMYCIN HCL IN DEXTROSE 1-5 GM/200ML-% IV SOLN
1000.0000 mg | Freq: Once | INTRAVENOUS | Status: AC
  Administered 2016-01-30: 1000 mg via INTRAVENOUS
  Filled 2016-01-30: qty 200

## 2016-01-30 MED ORDER — DEXTROSE 50 % IV SOLN
25.0000 g | Freq: Once | INTRAVENOUS | Status: AC
  Administered 2016-01-30: 25 g via INTRAVENOUS

## 2016-01-30 MED ORDER — SODIUM CHLORIDE 0.9 % IV SOLN: 250.0000 mL | INTRAVENOUS | Status: DC | PRN

## 2016-01-30 MED ORDER — NOREPINEPHRINE 4 MG/250ML-% IV SOLN
0.0000 ug/min | INTRAVENOUS | Status: DC
  Administered 2016-01-30 – 2016-01-31 (×4): 26 ug/min via INTRAVENOUS
  Filled 2016-01-30 (×5): qty 250

## 2016-01-30 MED ORDER — FAMOTIDINE IN NACL 20-0.9 MG/50ML-% IV SOLN
20.0000 mg | INTRAVENOUS | Status: DC
  Administered 2016-01-31 – 2016-02-01 (×2): 20 mg via INTRAVENOUS
  Filled 2016-01-30 (×2): qty 50

## 2016-01-30 MED ORDER — NOREPINEPHRINE BITARTRATE 1 MG/ML IV SOLN
0.0000 ug/min | INTRAVENOUS | Status: DC
  Filled 2016-01-30: qty 4

## 2016-01-30 NOTE — Progress Notes (Signed)
[place] Progress Note Patient Name: Gloria Cole DOB: 02/27/1945 MRN: [id number]   Date of Service  02/15/2016  HPI/Events of Note  Request to review KUB for NGT placement. NGT tip in distal stomach near the pylorus vs proximal duodenum.   eICU Interventions  OK to use NGT for meds, nutrition or fluids.     Intervention Category Intermediate Interventions: Diagnostic test evaluation  Sommer,Steven Eugene 01/29/2016, 5:30 PM

## 2016-01-30 NOTE — ED Notes (Signed)
At this time pt unable to provide information. MD at the bedside. Family and chaplain at the bedside.

## 2016-01-30 NOTE — ED Notes (Signed)
Pt FSBS reading critical low, MD aware and at the bedside.

## 2016-01-30 NOTE — ED Notes (Signed)
Bear hugger applied to patient at this time.

## 2016-01-30 NOTE — ED Notes (Signed)
CODE  SEPSIS  CALLED  TO  CARELINK 

## 2016-01-30 NOTE — ED Triage Notes (Signed)
Pt caregiver report pt had an appointment at [facility] this am and they were unable to obtain a BP so they sent her to the ED. Per caregiver, pts brother reports when he saw her Saturday she wasn't as responsive as she used to be. Pt unable to talk, stand laying in bed with eyes closed.

## 2016-01-30 NOTE — ED Notes (Signed)
CBG 171 

## 2016-01-30 NOTE — ED Provider Notes (Addendum)
[facility] Emergency Department Provider Note  ____________________________________________  Time seen: Approximately 12:19 PM  I have reviewed the triage vital signs and the nursing notes.   HISTORY  Chief Complaint Hypotension and Altered Mental Status Level 5 caveat:  Portions of the history and physical were unable to be obtained due to the patient's acute illness and altered mental status    HPI Lenice Schmuhl is a 70 y.o. female brought to the ED due to unresponsiveness. Her brother reports that she has not been eating or drinking much for the past week. She has cirrhosis and is supposed to be on lactulose. This was switched to lactulose enema because she wasn't taking it by mouth for her reliably. Unclear she still taking this. Other than worsening confusion over the last few days, no other acute symptoms noted by the brother.     Past Medical History:  Diagnosis Date  . Cirrhosis ([place])   . Diabetes ([place]) 2006  . Esophageal varices ([place]) March 2014  . GERD (gastroesophageal reflux disease)   . Hepatitis   . History of encephalopathy   . History of hepatitis C   . Hyperlipidemia associated with type 2 diabetes mellitus ([place])   . Hypertension   . Portal hypertensive gastropathy      Patient Active Problem List   Diagnosis Date Noted  . SBP (spontaneous bacterial peritonitis) ([place]) 12/08/2015  . Hepatic encephalopathy ([place]) 06/10/2015  . Acute hepatic encephalopathy 11/18/2014  . DM (diabetes mellitus) ([place]) 11/18/2014  . HTN (hypertension) 11/18/2014  . Nausea and vomiting 07/03/2012  . Gallstones 06/09/2012  . Hepatic cirrhosis ([place]) 06/09/2012     Past Surgical History:  Procedure Laterality Date  . ABDOMINAL HYSTERECTOMY  1994  . BREAST BIOPSY Right 1995   neg  . CHOLECYSTECTOMY  April 2014   Dr Bary Castilla  . COLONOSCOPY  2010  . COLONOSCOPY WITH PROPOFOL N/A 03/04/2015   Procedure: COLONOSCOPY WITH PROPOFOL;  Surgeon:  Hulen Luster, MD;  Location[facility] ENDOSCOPY;  Service: Gastroenterology;  Laterality: N/A;  . ERCP  April 2014   Dr Candace Cruise  . ESOPHAGOGASTRODUODENOSCOPY (EGD) WITH PROPOFOL N/A 10/07/2014   Procedure: ESOPHAGOGASTRODUODENOSCOPY (EGD) WITH PROPOFOL;  Surgeon: Hulen Luster, MD;  Location: [facility] ENDOSCOPY;  Service: Gastroenterology;  Laterality: N/A;  . ESOPHAGOGASTRODUODENOSCOPY (EGD) WITH PROPOFOL N/A 12/16/2014   Procedure: ESOPHAGOGASTRODUODENOSCOPY (EGD) WITH PROPOFOL;  Surgeon: Hulen Luster, MD;  Location: [facility] ENDOSCOPY;  Service: Gastroenterology;  Laterality: N/A;  . ESOPHAGOGASTRODUODENOSCOPY (EGD) WITH PROPOFOL N/A 03/04/2015   Procedure: ESOPHAGOGASTRODUODENOSCOPY (EGD) WITH PROPOFOL;  Surgeon: Hulen Luster, MD;  Location: [facility] ENDOSCOPY;  Service: Gastroenterology;  Laterality: N/A;  . UPPER GI ENDOSCOPY  2014     Prior to Admission medications   Medication Sig Start Date End Date Taking? Authorizing Provider  cyanocobalamin 500 MCG tablet Take 500 mcg by mouth every other day.    Yes Historical Provider, MD  Diclofenac Sodium 3 % GEL Place 2 g onto the skin 3 (three) times daily.   Yes Historical Provider, MD  dicyclomine (BENTYL) 10 MG capsule Take 10 mg by mouth every 6 (six) hours as needed for spasms.   Yes Historical Provider, MD  fluticasone (FLONASE) 50 MCG/ACT nasal spray Place 2 sprays into both nostrils daily.   Yes Historical Provider, MD  folic acid (FOLVITE) 1 MG tablet Take 0.5 tablets (0.5 mg total) by mouth daily. 12/13/15  Yes Nicholes Mango, MD  gabapentin (NEURONTIN) 100 MG capsule Take 100 mg by mouth  2 (two) times daily.   Yes Historical Provider, MD  lactulose (CHRONULAC) 10 GM/15ML solution Take 45 mLs (30 g total) by mouth 3 (three) times daily. Patient taking differently: Take 30 g by mouth 4 (four) times daily.  11/19/14  Yes Srikar Sudini, MD  loratadine (CLARITIN) 10 MG tablet Take 10 mg by mouth daily as needed for allergies or rhinitis.   Yes Historical Provider, MD   Multiple Vitamin (MULTIVITAMIN WITH MINERALS) TABS tablet Take 1 tablet by mouth daily.   Yes Historical Provider, MD  nadolol (CORGARD) 20 MG tablet Take 20 mg by mouth daily.    Yes Historical Provider, MD  nicotine (NICODERM CQ - DOSED IN MG/24 HOURS) 14 mg/24hr patch Place 1 patch (14 mg total) onto the skin daily. 12/13/15  Yes Nicholes Mango, MD  ondansetron (ZOFRAN-ODT) 4 MG disintegrating tablet Take 4 mg by mouth every 8 (eight) hours as needed for nausea or vomiting.   Yes Historical Provider, MD  pantoprazole (PROTONIX) 40 MG tablet Take 40 mg by mouth daily.    Yes Historical Provider, MD  pravastatin (PRAVACHOL) 40 MG tablet Take 40 mg by mouth at bedtime.    Yes Historical Provider, MD  rifaximin (XIFAXAN) 550 MG TABS tablet Take 550 mg by mouth 2 (two) times daily.   Yes Historical Provider, MD  spironolactone (ALDACTONE) 50 MG tablet Take 50 mg by mouth daily.    Yes Historical Provider, MD  traMADol (ULTRAM) 50 MG tablet Take 1 tablet (50 mg total) by mouth every 8 (eight) hours as needed for moderate pain (for pain). Patient taking differently: Take 50 mg by mouth every 8 (eight) hours as needed for moderate pain.  12/12/15  Yes Nicholes Mango, MD  witch hazel-glycerin (TUCKS) pad Apply topically as needed for itching. Patient taking differently: Apply 1 application topically as needed for itching. Apply to rectum 12/12/15  Yes Nicholes Mango, MD  levofloxacin (LEVAQUIN) 500 MG tablet Take 1 tablet (500 mg total) by mouth daily. Patient not taking: Reported on 02/13/2016 12/13/15   Nicholes Mango, MD  nystatin (MYCOSTATIN) 100000 UNIT/ML suspension Use as directed 5 mLs (500,000 Units total) in the mouth or throat 4 (four) times daily. Patient not taking: Reported on 02/05/2016 12/12/15   Nicholes Mango, MD     Allergies Penicillins   Family History  Problem Relation Age of Onset  . Diabetes Sister   . Diabetes Brother   . CAD Mother   . CVA Father   . Hypertension Father   .  Diabetes Father     Social History Social History  Substance Use Topics  . Smoking status: Current Some Day Smoker    Packs/day: 0.25    Years: 30.00    Types: Cigarettes  . Smokeless tobacco: Never Used  . Alcohol use No     Comment: occasionally    Review of Systems Unable to obtain due to altered mental status  ____________________________________________   PHYSICAL EXAM:  VITAL SIGNS: ED Triage Vitals  Enc Vitals Group     BP 02/01/2016 1024 (!) 77/55     Pulse Rate 02/16/2016 1024 73     Resp 01/20/2016 1024 15     Temp 02/05/2016 1046 (!) 95 F (35 C)     Temp Source 02/18/2016 1046 Rectal     SpO2 02/10/2016 1044 99 %     Weight 01/24/2016 1037 134 lb (60.8 kg)     Height 01/23/2016 1037 5\' 8"  (1.727 m)     Head  Circumference --      [place] --      Pain Score --      Pain Loc --      Pain Edu? --      Excl. in [place]? --     Vital signs reviewed, nursing assessments reviewed.   Constitutional:   Stuporous.. Ill-appearing. Eyes:   No scleral icterus. No conjunctival pallor. PERRL. ENT   Head:   Normocephalic and atraumatic.   Nose:   No congestion/rhinnorhea. No septal hematoma   Mouth/Throat:   Dry mucous membranes.    Neck:   No stridor. No SubQ emphysema. No meningismus. Hematological/Lymphatic/Immunilogical:   No cervical lymphadenopathy. Cardiovascular:   RRR. Symmetric bilateral radial and DP pulses.  No murmurs.  Respiratory:   Normal respiratory effort without tachypnea nor retractions. Breath sounds are clear and equal bilaterally. No wheezes/rales/rhonchi. Gastrointestinal:   Soft and nontender. Non distended. There is no CVA tenderness.  No rebound, rigidity, or guarding. Genitourinary:   deferred Musculoskeletal:   Nontender with normal range of motion in all extremities. No joint effusions.  No lower extremity tenderness.  No edema. Neurologic:   Groaning sounds.  Facial expression symmetric Motor grossly intact. Moves all extremities No  gross focal neurologic deficits are appreciated.  Skin:    Skin is warm, dry and intact. No rash noted.  No petechiae, purpura, or bullae. Poor skin turgor  ____________________________________________    LABS (pertinent positives/negatives) (all labs ordered are listed, but only abnormal results are displayed) Labs Reviewed  LACTIC ACID, PLASMA - Abnormal; Notable for the following:       Result Value   Lactic Acid, Venous 2.1 (*)    All other components within normal limits  COMPREHENSIVE METABOLIC PANEL - Abnormal; Notable for the following:    Sodium 132 (*)    Potassium 5.7 (*)    CO2 11 (*)    Glucose, Bld 49 (*)    BUN 47 (*)    Creatinine, Ser 3.23 (*)    Calcium 8.0 (*)    Total Protein 5.8 (*)    Albumin 1.7 (*)    AST 62 (*)    Total Bilirubin 2.1 (*)    GFR calc non Af Amer 13 (*)    GFR calc Af Amer 15 (*)    All other components within normal limits  TROPONIN I - Abnormal; Notable for the following:    Troponin I 0.06 (*)    All other components within normal limits  CBC WITH DIFFERENTIAL/PLATELET - Abnormal; Notable for the following:    RBC 3.70 (*)    MCV 101.6 (*)    MCH 34.4 (*)    RDW 16.8 (*)    Platelets 101 (*)    All other components within normal limits  APTT - Abnormal; Notable for the following:    aPTT 41 (*)    All other components within normal limits  PROTIME-INR - Abnormal; Notable for the following:    Prothrombin Time 23.9 (*)    All other components within normal limits  AMMONIA - Abnormal; Notable for the following:    Ammonia 116 (*)    All other components within normal limits  GLUCOSE, CAPILLARY - Abnormal; Notable for the following:    Glucose-Capillary <10 (*)    All other components within normal limits  GLUCOSE, CAPILLARY - Abnormal; Notable for the following:    Glucose-Capillary <10 (*)    All other components within normal limits  GLUCOSE, CAPILLARY -  Abnormal; Notable for the following:    Glucose-Capillary 171 (*)     All other components within normal limits  CULTURE, BLOOD (ROUTINE X 2)  CULTURE, BLOOD (ROUTINE X 2)  CULTURE, EXPECTORATED SPUTUM-ASSESSMENT  URINE CULTURE  LIPASE, BLOOD  LACTIC ACID, PLASMA   ____________________________________________   EKG    ____________________________________________    RADIOLOGY  Chest x-ray reveals clear lungs. PICC line catheter tip is in the SVC. Right internal jugular central line catheter tip appears to be situated in the right ventricle  ____________________________________________   PROCEDURES Procedures CRITICAL CARE Performed by: Joni Fears, Angely Dietz   Total critical care time: 35 minutes  Critical care time was exclusive of separately billable procedures and treating other patients.  Critical care was necessary to treat or prevent imminent or life-threatening deterioration.  Critical care was time spent personally by me on the following activities: development of treatment plan with patient and/or surrogate as well as nursing, discussions with consultants, evaluation of patient's response to treatment, examination of patient, obtaining history from patient or surrogate, ordering and performing treatments and interventions, ordering and review of laboratory studies, ordering and review of radiographic studies, pulse oximetry and re-evaluation of patient's condition.    EXTERNAL JUGULAR IV CATHETER INSERTION BY PHYSICIAN Performed by me Inability to obtain vascular access by nursing Catheter placed in left external jugular vein, dry and blood, infusing. Catheter placed in right external jugular vein, dry and blood, infusing. No apparent complications, tolerated well by patient.   CENTRAL LINE Performed by: Joni Fears, Tyronica Truxillo Consent: The procedure was performed in an emergent situation. verbal consent obtained from brother at bedside  Required items: required blood products, implants, devices, and special equipment  available Patient identity confirmed: arm band and provided demographic data Time out: Immediately prior to procedure a "time out" was called to verify the correct patient, procedure, equipment, support staff and site/side marked as required. Indications: vascular access Anesthesia: local infiltration Local anesthetic: lidocaine 1% with epinephrine Anesthetic total: 3 ml Patient sedated: no Preparation: skin prepped with 2% chlorhexidine Skin prep agent dried: skin prep agent completely dried prior to procedure Sterile barriers: all five maximum sterile barriers used - cap, mask, sterile gown, sterile gloves, and large sterile sheet Hand hygiene: hand hygiene performed prior to central venous catheter insertion  Location details: Right internal jugular   Catheter type: triple lumen Catheter size: 7 Fr Pre-procedure: landmarks identified Ultrasound guidance: Yes, real-time continuous visualization throughout needle localization of vessel and confirmation of guidewire placement in internal jugular.  Successful placement: yes Post-procedure: line sutured and dressing applied Assessment: blood return through all parts, free fluid flow, placement verified by x-ray and no pneumothorax on x-ray Patient tolerance: Patient tolerated the procedure well with no immediate complications.    ____________________________________________   INITIAL IMPRESSION / ASSESSMENT AND PLAN / ED COURSE  Pertinent labs & imaging results that were available during my care of the patient were reviewed by me and considered in my medical decision making (see chart for details).  Patient presents with altered mental status and hypotension. Remained hypotensive even after initial fluid challenge with 2 L saline bolus with a map of 40-45. Levothyroid was therefore started. Due to poor vascular access peripherally, even with the external jugular catheters that I placed, central line was placed. The patient has a PICC  line, but did not seem to be drawing or flushing very easily.   Blood pressure improved on levothyroid. Labs reveal acute renal failure with hyperkalemia. Elevated lactate. Markedly elevated ammonia  level. Case discussed with Dr. Stevenson Clinch of pulmonary critical care for admission to ICU.     Clinical Course    ____________________________________________   FINAL CLINICAL IMPRESSION(S) / ED DIAGNOSES  Final diagnoses:  Acute renal failure, unspecified acute renal failure type ([place])  Sepsis, due to unspecified organism [facility])  Hepatic encephalopathy ([place])      New Prescriptions   No medications on file     Portions of this note were generated with dragon dictation software. Dictation errors may occur despite best attempts at proofreading.    Carrie Mew, MD 01/29/2016 1243   Sepsis - Repeat Assessment  Performed at:    12:30pm  Vitals     Blood pressure 93/67, pulse 72, temperature (!) 95.1 F (35.1 C), resp. rate 10, height 5\' 8"  (1.727 m), weight 134 lb (60.8 kg), SpO2 100 %.  Heart:     Regular rate and rhythm  Lungs:    CTA  Capillary Refill:   <2 sec  Peripheral Pulse:   Radial pulse palpable  Skin:     Normal Color       Carrie Mew, MD 02/17/2016 1326

## 2016-01-30 NOTE — ED Notes (Signed)
Unable to start sepsis antibiotics within 30 minute range due to hypotension and lack of IV access.

## 2016-01-30 NOTE — H&P (Signed)
PULMONARY / CRITICAL CARE MEDICINE   Name: Gloria Cole MRN: [id number] DOB: Sep 20, 1945    ADMISSION DATE:  02/13/2016 CONSULTATION DATE:  01/25/2016  REFERRING MD :  Dr. Joni Fears (ER)   CHIEF COMPLAINT:     Hypotension, AMS, lethargy   HISTORY OF PRESENT ILLNESS    70 year old female past medical history of cirrhosis, diabetes mellitus allergy varices, GERD, hepatitis C, hyperlipidemia presenting with hypotension, altered mental status, lethargy, requiring Levophed and transferred to ICU. History per chart review and by the bedside. Brother stated that for the past 2-3 today she's been more lethargic than usual, she has not been eating much, no fever, no chills, no sweats. This morning she was noted to be severely altered, EMS was called, and she was brought to the ER where her blood glucose level was less than 10, she was given 3 Amp of D50 and started on a D10 drip. She was also noted to be severely hypotensive was given 2 L of fluids and not responding, therefore a right IJ central line was placed by ER physician and she was started on levo fed. [facility] is currently a [facility]. Review of chart shows recent admission for abdominal pain with ascites, 2.8 L of clear fluid removed and she was discharged on 5 days of an panic antibiotic with Levaquin.   SIGNIFICANT EVENTS    12/11> Lethargic, hypotensive, hypoglycemic, require 3 A of D50 and a bolus of D10, placed on levo fed, transferred to ICU  PAST MEDICAL HISTORY    :  Past Medical History:  Diagnosis Date  . Cirrhosis ([place])   . Diabetes ([place]) 2006  . Esophageal varices ([place]) March 2014  . GERD (gastroesophageal reflux disease)   . Hepatitis   . History of encephalopathy   . History of hepatitis C   . Hyperlipidemia associated with type 2 diabetes mellitus ([place])   . Hypertension   . Portal hypertensive gastropathy    Past Surgical History:  Procedure Laterality Date  . ABDOMINAL HYSTERECTOMY   1994  . BREAST BIOPSY Right 1995   neg  . CHOLECYSTECTOMY  April 2014   Dr Bary Castilla  . COLONOSCOPY  2010  . COLONOSCOPY WITH PROPOFOL N/A 03/04/2015   Procedure: COLONOSCOPY WITH PROPOFOL;  Surgeon: Hulen Luster, MD;  Location: [facility] ENDOSCOPY;  Service: Gastroenterology;  Laterality: N/A;  . ERCP  April 2014   Dr Candace Cruise  . ESOPHAGOGASTRODUODENOSCOPY (EGD) WITH PROPOFOL N/A 10/07/2014   Procedure: ESOPHAGOGASTRODUODENOSCOPY (EGD) WITH PROPOFOL;  Surgeon: Hulen Luster, MD;  Location: [facility] ENDOSCOPY;  Service: Gastroenterology;  Laterality: N/A;  . ESOPHAGOGASTRODUODENOSCOPY (EGD) WITH PROPOFOL N/A 12/16/2014   Procedure: ESOPHAGOGASTRODUODENOSCOPY (EGD) WITH PROPOFOL;  Surgeon: Hulen Luster, MD;  Location: [facility] ENDOSCOPY;  Service: Gastroenterology;  Laterality: N/A;  . ESOPHAGOGASTRODUODENOSCOPY (EGD) WITH PROPOFOL N/A 03/04/2015   Procedure: ESOPHAGOGASTRODUODENOSCOPY (EGD) WITH PROPOFOL;  Surgeon: Hulen Luster, MD;  Location: [facility] ENDOSCOPY;  Service: Gastroenterology;  Laterality: N/A;  . UPPER GI ENDOSCOPY  2014   Prior to Admission medications   Medication Sig Start Date End Date Taking? Authorizing Provider  cyanocobalamin 500 MCG tablet Take 500 mcg by mouth every other day.    Yes Historical Provider, MD  Diclofenac Sodium 3 % GEL Place 2 g onto the skin 3 (three) times daily.   Yes Historical Provider, MD  dicyclomine (BENTYL) 10 MG capsule Take 10 mg by mouth every 6 (six) hours as needed for spasms.   Yes Historical Provider, MD  fluticasone (  FLONASE) 50 MCG/ACT nasal spray Place 2 sprays into both nostrils daily.   Yes Historical Provider, MD  folic acid (FOLVITE) 1 MG tablet Take 0.5 tablets (0.5 mg total) by mouth daily. 12/13/15  Yes Nicholes Mango, MD  gabapentin (NEURONTIN) 100 MG capsule Take 100 mg by mouth 2 (two) times daily.   Yes Historical Provider, MD  lactulose (CHRONULAC) 10 GM/15ML solution Take 45 mLs (30 g total) by mouth 3 (three) times daily. Patient taking differently: Take 30 g  by mouth 4 (four) times daily.  11/19/14  Yes Srikar Sudini, MD  loratadine (CLARITIN) 10 MG tablet Take 10 mg by mouth daily as needed for allergies or rhinitis.   Yes Historical Provider, MD  Multiple Vitamin (MULTIVITAMIN WITH MINERALS) TABS tablet Take 1 tablet by mouth daily.   Yes Historical Provider, MD  nadolol (CORGARD) 20 MG tablet Take 20 mg by mouth daily.    Yes Historical Provider, MD  nicotine (NICODERM CQ - DOSED IN MG/24 HOURS) 14 mg/24hr patch Place 1 patch (14 mg total) onto the skin daily. 12/13/15  Yes Nicholes Mango, MD  ondansetron (ZOFRAN-ODT) 4 MG disintegrating tablet Take 4 mg by mouth every 8 (eight) hours as needed for nausea or vomiting.   Yes Historical Provider, MD  pantoprazole (PROTONIX) 40 MG tablet Take 40 mg by mouth daily.    Yes Historical Provider, MD  pravastatin (PRAVACHOL) 40 MG tablet Take 40 mg by mouth at bedtime.    Yes Historical Provider, MD  rifaximin (XIFAXAN) 550 MG TABS tablet Take 550 mg by mouth 2 (two) times daily.   Yes Historical Provider, MD  spironolactone (ALDACTONE) 50 MG tablet Take 50 mg by mouth daily.    Yes Historical Provider, MD  traMADol (ULTRAM) 50 MG tablet Take 1 tablet (50 mg total) by mouth every 8 (eight) hours as needed for moderate pain (for pain). Patient taking differently: Take 50 mg by mouth every 8 (eight) hours as needed for moderate pain.  12/12/15  Yes Nicholes Mango, MD  witch hazel-glycerin (TUCKS) pad Apply topically as needed for itching. Patient taking differently: Apply 1 application topically as needed for itching. Apply to rectum 12/12/15  Yes Nicholes Mango, MD  levofloxacin (LEVAQUIN) 500 MG tablet Take 1 tablet (500 mg total) by mouth daily. Patient not taking: Reported on 01/29/2016 12/13/15   Nicholes Mango, MD  nystatin (MYCOSTATIN) 100000 UNIT/ML suspension Use as directed 5 mLs (500,000 Units total) in the mouth or throat 4 (four) times daily. Patient not taking: Reported on 02/19/2016 12/12/15   Nicholes Mango, MD    Allergies  Allergen Reactions  . Penicillins Swelling and Other (See Comments)    Reaction:  Facial swelling  Has patient had a PCN reaction causing immediate rash, facial/tongue/throat swelling, SOB or lightheadedness with hypotension: Yes Has patient had a PCN reaction causing severe rash involving mucus membranes or skin necrosis: No Has patient had a PCN reaction that required hospitalization No Has patient had a PCN reaction occurring within the last 10 years: No If all of the above answers are "NO", then may proceed with Cephalosporin use.     FAMILY HISTORY   Family History  Problem Relation Age of Onset  . Diabetes Sister   . Diabetes Brother   . CAD Mother   . CVA Father   . Hypertension Father   . Diabetes Father       SOCIAL HISTORY    reports that she has been smoking Cigarettes.  She has a  7.50 pack-year smoking history. She has never used smokeless tobacco. She reports that she does not drink alcohol or use drugs.  Review of Systems  Unable to perform ROS: Critical illness      VITAL SIGNS    Temp:  [94 F (34.4 C)-95.2 F (35.1 C)] 95.2 F (35.1 C) (12/11 1338) Pulse Rate:  [35-77] 72 (12/11 1348) Resp:  [10-15] 13 (12/11 1348) BP: (70-128)/(34-87) 108/69 (12/11 1348) SpO2:  [99 %-100 %] 100 % (12/11 1348) Weight:  [60.8 kg (134 lb)] 60.8 kg (134 lb) (12/11 1037) HEMODYNAMICS:   VENTILATOR SETTINGS:   INTAKE / OUTPUT:  Intake/Output Summary (Last 24 hours) at 01/22/2016 1400 Last data filed at 02/01/2016 1330  Gross per 24 hour  Intake             2200 ml  Output              300 ml  Net             1900 ml       PHYSICAL EXAM   Physical Exam  Constitutional: She appears well-developed and well-nourished.  HENT:  Head: Normocephalic and atraumatic.  Right Ear: External ear normal.  Left Ear: External ear normal.  Mild scleral icterus  Eyes: Conjunctivae and EOM are normal. Pupils are equal, round, and reactive to light.  Neck:  Normal range of motion. Neck supple.  Cardiovascular: Normal rate, regular rhythm and normal heart sounds.   No murmur heard. Pulmonary/Chest: Effort normal and breath sounds normal. No respiratory distress. She has no wheezes.  Abdominal: Soft. Bowel sounds are normal. She exhibits no distension. There is no tenderness.  Musculoskeletal: Normal range of motion.  Neurological:  Disoriented, lethargic  Skin: Skin is warm.       LABS   LABS:  CBC  Recent Labs Lab 02/10/2016 1034  WBC 5.9  HGB 12.7  HCT 37.6  PLT 101*   Coag's  Recent Labs Lab 02/14/2016 1034  APTT 41*  INR 2.10   BMET  Recent Labs Lab 02/09/2016 1034  NA 132*  K 5.7*  CL 111  CO2 11*  BUN 47*  CREATININE 3.23*  GLUCOSE 49*   Electrolytes  Recent Labs Lab 02/11/2016 1034  CALCIUM 8.0*   Sepsis Markers  Recent Labs Lab 01/29/2016 1034  LATICACIDVEN 2.1*   ABG No results for input(s): PHART, PCO2ART, PO2ART in the last 168 hours. Liver Enzymes  Recent Labs Lab 01/28/2016 1034  AST 62*  ALT 22  ALKPHOS 118  BILITOT 2.1*  ALBUMIN 1.7*   Cardiac Enzymes  Recent Labs Lab 02/10/2016 1034  TROPONINI 0.06*   Glucose  Recent Labs Lab 02/01/2016 1029 02/16/2016 1049 02/05/2016 1146  GLUCAP <10* <10* 171*     No results found for this or any previous visit (from the past 240 hour(s)).   Current Facility-Administered Medications:  .  0.9 %  sodium chloride infusion, 250 mL, Intravenous, PRN, Meredeth Furber, MD .  0.9 %  sodium chloride infusion, , Intravenous, Continuous, Kinsie Belford, MD .  Derrill Memo ON 01/31/2016] famotidine (PEPCID) IVPB 20 mg premix, 20 mg, Intravenous, Q24H, Hajira Verhagen, MD .  heparin injection 5,000 Units, 5,000 Units, Subcutaneous, Q8H, Dracen Reigle, MD .  ipratropium-albuterol (DUONEB) 0.5-2.5 (3) MG/3ML nebulizer solution 3 mL, 3 mL, Nebulization, Q6H, Alexya Mcdaris, MD .  vancomycin (VANCOCIN) IVPB 1000 mg/200 mL premix, 1,000 mg, Intravenous, Once, Carrie Mew, MD, Last Rate: 200 mL/hr at 02/06/2016 1339, 1,000 mg at 01/26/2016  1339  Current Outpatient Prescriptions:  .  cyanocobalamin 500 MCG tablet, Take 500 mcg by mouth every other day. , Disp: , Rfl:  .  Diclofenac Sodium 3 % GEL, Place 2 g onto the skin 3 (three) times daily., Disp: , Rfl:  .  dicyclomine (BENTYL) 10 MG capsule, Take 10 mg by mouth every 6 (six) hours as needed for spasms., Disp: , Rfl:  .  fluticasone (FLONASE) 50 MCG/ACT nasal spray, Place 2 sprays into both nostrils daily., Disp: , Rfl:  .  folic acid (FOLVITE) 1 MG tablet, Take 0.5 tablets (0.5 mg total) by mouth daily., Disp: , Rfl:  .  gabapentin (NEURONTIN) 100 MG capsule, Take 100 mg by mouth 2 (two) times daily., Disp: , Rfl:  .  lactulose (CHRONULAC) 10 GM/15ML solution, Take 45 mLs (30 g total) by mouth 3 (three) times daily. (Patient taking differently: Take 30 g by mouth 4 (four) times daily. ), Disp: 946 mL, Rfl: 0 .  loratadine (CLARITIN) 10 MG tablet, Take 10 mg by mouth daily as needed for allergies or rhinitis., Disp: , Rfl:  .  Multiple Vitamin (MULTIVITAMIN WITH MINERALS) TABS tablet, Take 1 tablet by mouth daily., Disp: , Rfl:  .  nadolol (CORGARD) 20 MG tablet, Take 20 mg by mouth daily. , Disp: , Rfl:  .  nicotine (NICODERM CQ - DOSED IN MG/24 HOURS) 14 mg/24hr patch, Place 1 patch (14 mg total) onto the skin daily., Disp: 28 patch, Rfl: 0 .  ondansetron (ZOFRAN-ODT) 4 MG disintegrating tablet, Take 4 mg by mouth every 8 (eight) hours as needed for nausea or vomiting., Disp: , Rfl:  .  pantoprazole (PROTONIX) 40 MG tablet, Take 40 mg by mouth daily. , Disp: , Rfl:  .  pravastatin (PRAVACHOL) 40 MG tablet, Take 40 mg by mouth at bedtime. , Disp: , Rfl:  .  rifaximin (XIFAXAN) 550 MG TABS tablet, Take 550 mg by mouth 2 (two) times daily., Disp: , Rfl:  .  spironolactone (ALDACTONE) 50 MG tablet, Take 50 mg by mouth daily. , Disp: , Rfl:  .  traMADol (ULTRAM) 50 MG tablet, Take 1 tablet (50 mg total) by  mouth every 8 (eight) hours as needed for moderate pain (for pain). (Patient taking differently: Take 50 mg by mouth every 8 (eight) hours as needed for moderate pain. ), Disp: 30 tablet, Rfl: 0 .  witch hazel-glycerin (TUCKS) pad, Apply topically as needed for itching. (Patient taking differently: Apply 1 application topically as needed for itching. Apply to rectum), Disp: 40 each, Rfl: 12 .  levofloxacin (LEVAQUIN) 500 MG tablet, Take 1 tablet (500 mg total) by mouth daily. (Patient not taking: Reported on 02/03/2016), Disp: 4 tablet, Rfl: 0 .  nystatin (MYCOSTATIN) 100000 UNIT/ML suspension, Use as directed 5 mLs (500,000 Units total) in the mouth or throat 4 (four) times daily. (Patient not taking: Reported on 01/28/2016), Disp: 60 mL, Rfl: 0  IMAGING    Dg Chest Port 1 View  Result Date: 02/09/2016 CLINICAL DATA:  Confusion and hypotension EXAM: PORTABLE CHEST 1 VIEW COMPARISON:  December 08, 2015 Right jugular catheter tip is in the right atrium. Right subclavian catheter tip is in the superior vena cava. No pneumothorax. There is no edema or consolidation. Heart size and pulmonary vascularity are normal. No adenopathy. There is atherosclerotic calcification in the aorta. Bones are osteoporotic. IMPRESSION: Central catheters as described without pneumothorax. No edema or consolidation. Aortic atherosclerosis. Bones osteoporotic. Electronically Signed   By: Lowella Grip  III M.D.   On: 02/18/2016 12:17      Indwelling Urinary Catheter continued, requirement due to   Reason to continue Indwelling Urinary Catheter for strict Intake/Output monitoring for hemodynamic instability   Central Line continued, requirement due to   Reason to continue [employer] Monitoring of central venous pressure or other hemodynamic parameters   Ventilator continued, requirement due to, resp failure    Ventilator Sedation RASS 0 to -2   Cultures: BCx2 12/11 UC 12/11 Sputum  Antibiotics: Vanc  12/11 Meropenem 12/11  Lines: RIJ 12/11 (ER)  Discussion: 70 year old female past medical history of liver cirrhosis, diabetes, ascites, recent admission for abdominal pain with 2.8 L of fluid removed. Now with sepsis of a known etiology, hypertension, altered mental status, elevated ammonia level, on levophed  ASSESSMENT/PLAN    PULMONARY No acute issues P:   Cont with supplemental O2, maintain sats >88%  CARDIOVASCULAR CVL RIJ  Hypotension P:  Cont with pressor as needed to maintain MAP>65 Hemodynamic monitoring  RENAL AKI Dehydration P:   Cont with NS @100cc  Monitor BMP Possible PO intake and dehydration Will place NGT for FEN and meds  GASTROINTESTINAL Hx of Liver Cirrhosis Ascities Hyperammonemia  P:   PUD Place NGT for FEN and meds Will give lactulose via tube  Plan for US Paracentesis  HEMATOLOGIC Hx of Thrombocytopenia P:  CBC  INFECTIOUS Sepsis -unknown etiology, ?SBP P:   Cont with above abx Bedside spot U/S with moderate amount of fluid, will plan for U/S guided paracentesis  ENDOCRINE DM 2 hypoglycemia P:   - hold metformin, especially with AKI - D5NS @100cc /hr - glucose checks  NEUROLOGIC AMS Metabolic encephalopathy P:   RASS goal: 0 CT head 12/11>no acute findings.  Lactulose of elevated ammonia levels  Family - discussed admission with 2 brothers at bedside and current clinical status, they agree with above plan, also stated that patient is a DNR/DNI. Vicente Serene (Brother) and his daughter make medical decision for patient. Patient does have a son, but very difficult to establish communication with.   CODE Status -DNR/DNI    I have personally obtained a history, examined the patient, evaluated laboratory and imaging results, formulated the assessment and plan and placed orders.  The Patient requires high complexity decision making for assessment and support, frequent evaluation and titration of therapies, application of  advanced monitoring technologies and extensive interpretation of multiple databases. Critical Care Time devoted to patient care services described in this note is 55 minutes.   Overall, patient is critically ill, prognosis is guarded. Patient at high risk for cardiac arrest and death.   Vilinda Boehringer, MD [facility] Pager [phone] (please enter 7-digits) On Call Pager [phone] (please enter 7-digits)     02/05/2016, 2:00 PM  Note: This note was prepared with Dragon dictation along with smaller phrase technology. Any transcriptional errors that result from this process are unintentional.

## 2016-01-30 NOTE — Progress Notes (Signed)
The Nurse asked [facility] to visit the Pt in ED Rm 18. [place] visited the Pt, Pt's husband was on the bedside. [place] talked with Pt's husband while the Nurses were treating the Pt, [facility] provided water, spiritual support and prayers for the husband and for the Pt. [place] made a follow-up of the Pt and family again later and provided spiritual support.     01/25/2016 1600  Clinical Encounter Type  Visited With Patient;Patient and family together  Visit Type Initial;Code;ED;Trauma  Referral From Nurse  Consult/Referral To Chaplain  Spiritual Encounters  Spiritual Needs Prayer;Emotional;Other (Comment)

## 2016-01-30 NOTE — ED Notes (Signed)
All bear huggers in use at this time. Charge nurse aware of need of bear hugger.

## 2016-01-30 NOTE — ED Notes (Signed)
Dr. Joni Fears notified of elevated troponin.

## 2016-01-30 NOTE — ED Notes (Signed)
Dr. Joni Fears made aware of lactic acid 2.1.

## 2016-01-30 NOTE — Progress Notes (Signed)
CVP in place as ordered.  Noticed at this time that sats were in upper 80's. Maintenance fluid held.  Placed on 2L [place].  Sats continued to drop.  Placed on non-rebreather.  Sats slowly rose to current reading of 90.  RT aware.   Will continue to monitor.

## 2016-01-30 NOTE — ED Notes (Signed)
CBG "LO" on monitor.

## 2016-01-31 ENCOUNTER — Inpatient Hospital Stay: Payer: Medicare Other

## 2016-01-31 DIAGNOSIS — R6521 Severe sepsis with septic shock: Secondary | ICD-10-CM

## 2016-01-31 DIAGNOSIS — I959 Hypotension, unspecified: Secondary | ICD-10-CM

## 2016-01-31 DIAGNOSIS — R188 Other ascites: Secondary | ICD-10-CM

## 2016-01-31 DIAGNOSIS — R17 Unspecified jaundice: Secondary | ICD-10-CM

## 2016-01-31 DIAGNOSIS — Z7189 Other specified counseling: Secondary | ICD-10-CM

## 2016-01-31 LAB — COMPREHENSIVE METABOLIC PANEL
ALT: 20 U/L (ref 14–54)
ANION GAP: 7 (ref 5–15)
AST: 54 U/L — ABNORMAL HIGH (ref 15–41)
Albumin: 1.5 g/dL — ABNORMAL LOW (ref 3.5–5.0)
Alkaline Phosphatase: 115 U/L (ref 38–126)
BUN: 45 mg/dL — ABNORMAL HIGH (ref 6–20)
CHLORIDE: 111 mmol/L (ref 101–111)
CO2: 12 mmol/L — ABNORMAL LOW (ref 22–32)
Calcium: 7.3 mg/dL — ABNORMAL LOW (ref 8.9–10.3)
Creatinine, Ser: 3.23 mg/dL — ABNORMAL HIGH (ref 0.44–1.00)
GFR, EST AFRICAN AMERICAN: 15 mL/min — AB (ref >60)
GFR, EST NON AFRICAN AMERICAN: 13 mL/min — AB (ref >60)
Glucose, Bld: 184 mg/dL — ABNORMAL HIGH (ref 65–99)
POTASSIUM: 3.9 mmol/L (ref 3.5–5.1)
Sodium: 130 mmol/L — ABNORMAL LOW (ref 135–145)
TOTAL PROTEIN: 5.4 g/dL — AB (ref 6.5–8.1)
Total Bilirubin: 1.9 mg/dL — ABNORMAL HIGH (ref 0.3–1.2)

## 2016-01-31 LAB — PROTIME-INR
INR: 2.79
PROTHROMBIN TIME: 30 s — AB (ref 11.4–15.2)

## 2016-01-31 LAB — CBC
HCT: 35.9 % (ref 35.0–47.0)
HEMOGLOBIN: 12.2 g/dL (ref 12.0–16.0)
MCH: 34.4 pg — AB (ref 26.0–34.0)
MCHC: 33.9 g/dL (ref 32.0–36.0)
MCV: 101.4 fL — ABNORMAL HIGH (ref 80.0–100.0)
Platelets: 100 10*3/uL — ABNORMAL LOW (ref 150–440)
RBC: 3.54 MIL/uL — AB (ref 3.80–5.20)
RDW: 17.2 % — ABNORMAL HIGH (ref 11.5–14.5)
WBC: 3.9 10*3/uL (ref 3.6–11.0)

## 2016-01-31 LAB — BODY FLUID CELL COUNT WITH DIFFERENTIAL
Eos, Fluid: 0 %
Lymphs, Fluid: 7 %
Monocyte-Macrophage-Serous Fluid: 4 %
NEUTROPHIL FLUID: 89 %
Other Cells, Fluid: 0 %
WBC FLUID: 158 uL

## 2016-01-31 LAB — BLOOD GAS, ARTERIAL
ACID-BASE DEFICIT: 16.7 mmol/L — AB (ref 0.0–2.0)
Acid-base deficit: 9.8 mmol/L — ABNORMAL HIGH (ref 0.0–2.0)
BICARBONATE: 9.9 mmol/L — AB (ref 20.0–28.0)
BICARBONATE: 14.8 mmol/L — AB (ref 20.0–28.0)
FIO2: 1
FIO2: 100
O2 SAT: 98 %
O2 SAT: 97.3 %
PATIENT TEMPERATURE: 37
PATIENT TEMPERATURE: 37
PCO2 ART: 28 mmHg — AB (ref 32.0–48.0)
PO2 ART: 126 mmHg — AB (ref 83.0–108.0)
PO2 ART: 101 mmHg (ref 83.0–108.0)
pCO2 arterial: 26 mmHg — ABNORMAL LOW (ref 32.0–48.0)
pH, Arterial: 7.19 — CL (ref 7.350–7.450)
pH, Arterial: 7.33 — ABNORMAL LOW (ref 7.350–7.450)

## 2016-01-31 LAB — PROTEIN, BODY FLUID: Total protein, fluid: <3 g/dL

## 2016-01-31 LAB — URINE CULTURE: Culture: NO GROWTH

## 2016-01-31 LAB — GLUCOSE, CAPILLARY
GLUCOSE-CAPILLARY: 183 mg/dL — AB (ref 65–99)
GLUCOSE-CAPILLARY: 135 mg/dL — AB (ref 65–99)
GLUCOSE-CAPILLARY: 157 mg/dL — AB (ref 65–99)
GLUCOSE-CAPILLARY: 148 mg/dL — AB (ref 65–99)
Glucose-Capillary: 119 mg/dL — ABNORMAL HIGH (ref 65–99)
Glucose-Capillary: 121 mg/dL — ABNORMAL HIGH (ref 65–99)
Glucose-Capillary: 134 mg/dL — ABNORMAL HIGH (ref 65–99)
Glucose-Capillary: 134 mg/dL — ABNORMAL HIGH (ref 65–99)
Glucose-Capillary: 191 mg/dL — ABNORMAL HIGH (ref 65–99)

## 2016-01-31 LAB — LACTATE DEHYDROGENASE, PLEURAL OR PERITONEAL FLUID: LD FL: 19 U/L (ref 3–23)

## 2016-01-31 LAB — MAGNESIUM: MAGNESIUM: 1.2 mg/dL — AB (ref 1.7–2.4)

## 2016-01-31 LAB — AMMONIA: AMMONIA: 196 umol/L — AB (ref 9–35)

## 2016-01-31 LAB — PHOSPHORUS: PHOSPHORUS: 3.9 mg/dL (ref 2.5–4.6)

## 2016-01-31 LAB — PROCALCITONIN: PROCALCITONIN: 0.23 ng/mL

## 2016-01-31 LAB — GLUCOSE, PERITONEAL FLUID: GLUCOSE, PERITONEAL FLUID: 176 mg/dL

## 2016-01-31 MED ORDER — SODIUM BICARBONATE 4.2 % IV SOLN: 50.0000 meq | INTRAVENOUS | Status: DC

## 2016-01-31 MED ORDER — DEXTROSE 5 % IV SOLN
0.0000 ug/min | INTRAVENOUS | Status: DC
  Administered 2016-01-31: 26 ug/min via INTRAVENOUS
  Administered 2016-01-31: 40 ug/min via INTRAVENOUS
  Administered 2016-01-31: 30 ug/min via INTRAVENOUS
  Administered 2016-02-01: 18 ug/min via INTRAVENOUS
  Administered 2016-02-02: 10 ug/min via INTRAVENOUS
  Filled 2016-01-31 (×5): qty 16

## 2016-01-31 MED ORDER — LACTULOSE 10 GM/15ML PO SOLN
30.0000 g | Freq: Four times a day (QID) | ORAL | Status: DC
  Administered 2016-01-31: 30 g via ORAL
  Filled 2016-01-31: qty 60

## 2016-01-31 MED ORDER — SODIUM BICARBONATE 8.4 % IV SOLN
50.0000 meq | Freq: Once | INTRAVENOUS | Status: AC
  Administered 2016-01-31: 50 meq via INTRAVENOUS
  Filled 2016-01-31: qty 50

## 2016-01-31 MED ORDER — LACTULOSE ENEMA
300.0000 mL | Freq: Two times a day (BID) | ORAL | Status: DC
  Filled 2016-01-31 (×2): qty 300

## 2016-01-31 MED ORDER — SODIUM CHLORIDE 0.9 % IV BOLUS (SEPSIS)
500.0000 mL | Freq: Once | INTRAVENOUS | Status: AC
  Administered 2016-01-31: 500 mL via INTRAVENOUS

## 2016-01-31 MED ORDER — FLEET ENEMA 7-19 GM/118ML RE ENEM
1.0000 | ENEMA | Freq: Once | RECTAL | Status: AC
  Administered 2016-01-31: 1 via RECTAL

## 2016-01-31 MED ORDER — VITAL 1.5 CAL PO LIQD
1000.0000 mL | ORAL | Status: DC
  Administered 2016-01-31: 1000 mL

## 2016-01-31 MED ORDER — VITAL HIGH PROTEIN PO LIQD: 1000.0000 mL | ORAL | Status: DC

## 2016-01-31 MED ORDER — MAGNESIUM SULFATE 4 GM/100ML IV SOLN
4.0000 g | Freq: Once | INTRAVENOUS | Status: AC
  Administered 2016-01-31: 4 g via INTRAVENOUS
  Filled 2016-01-31: qty 100

## 2016-01-31 MED ORDER — LACTULOSE 10 GM/15ML PO SOLN
30.0000 g | Freq: Four times a day (QID) | ORAL | Status: DC
  Administered 2016-01-31 – 2016-02-02 (×8): 30 g via ORAL
  Filled 2016-01-31 (×8): qty 60

## 2016-01-31 MED ORDER — VANCOMYCIN HCL IN DEXTROSE 1-5 GM/200ML-% IV SOLN
1000.0000 mg | INTRAVENOUS | Status: DC
  Filled 2016-01-31: qty 200

## 2016-01-31 MED ORDER — SODIUM BICARBONATE 8.4 % IV SOLN
100.0000 meq | Freq: Once | INTRAVENOUS | Status: AC
  Administered 2016-01-31: 100 meq via INTRAVENOUS
  Filled 2016-01-31: qty 100

## 2016-01-31 MED ORDER — STERILE WATER FOR INJECTION IV SOLN
INTRAVENOUS | Status: DC
  Administered 2016-01-31 – 2016-02-02 (×4): via INTRAVENOUS
  Filled 2016-01-31 (×6): qty 850

## 2016-01-31 MED ORDER — VASOPRESSIN 20 UNIT/ML IV SOLN
0.0300 [IU]/min | INTRAVENOUS | Status: DC
  Administered 2016-01-31 – 2016-02-02 (×4): 0.03 [IU]/min via INTRAVENOUS
  Filled 2016-01-31 (×4): qty 2

## 2016-01-31 NOTE — Progress Notes (Signed)
Patient lethargic, obtundent throughout the shift. She has had 2 large bowel movements. Titrating levophed to maintain patients map of 65. Added vasopressin. Dr Candiss Norse consulted and aware of patients urine output. Paracentesis performed this am. Tube feeds started. Palliative care consult pending.

## 2016-01-31 NOTE — Progress Notes (Signed)
Initial Nutrition Assessment  DOCUMENTATION CODES:   Not applicable  INTERVENTION:  -Discussed nutrition poc during ICU rounds; pt with NG tube in place, no acute process per abdominal xray. MD Mungal agreeable to starting trophic feedings. Recommend starting Vital 1.5 at rate of 20 ml/hr today. Assess tolerance and ability to titrate TF to goal on follow. Goal rate to meet nutritional needs is 55 ml/hr providing 90 g protein, 1980 kcals, 1109 mL of free water.  NUTRITION DIAGNOSIS:   Inadequate oral intake related to inability to eat as evidenced by NPO status.  GOAL:   Patient will meet greater than or equal to 90% of their needs  MONITOR:   TF tolerance, Diet advancement, Labs, Weight trends  REASON FOR ASSESSMENT:   Malnutrition Screening Tool    ASSESSMENT:   70 yo female admitted with lethargy, hypotension with sepsis. Pt with hx of cirrhosis, esophageal varices, DM, hepatitis C  S/p paracentesis today with 750 mL removed  Pt on non-rebreather. Levophed at 40 mcg/min, vasopressin as well  NG tube located in stomach, pt NPO  Labs: sodium 130, magnesium 1.2 (supplemented), FSBS 100s, ammonia 196 Meds: lactulose, enema x 1, sodium bicarbonate at 75 ml/hr, levophed, vasopressin  Diet Order:  Diet NPO time specified  Skin:   (stage II sacrum)  Last BM:  no documented BM  Height:    Ht Readings from Last 1 Encounters:  01/27/2016 5\' 5"  (1.651 m)    Weight:   Wt Readings from Last 1 Encounters:  01/31/16 160 lb 4.4 oz (72.7 kg)    BMI:  Body mass index is 26.67 kg/m.  Estimated Nutritional Needs:   Kcal:  1800-2100 kcals  Protein:  90-105 g  Fluid:  >/= 1.8 L  EDUCATION NEEDS:   No education needs identified at this time  [place], [place], [place] [phone] Pager  [phone] Weekend/On-Call Pager

## 2016-01-31 NOTE — Progress Notes (Signed)
PULMONARY / CRITICAL CARE MEDICINE   Name: Gloria Cole MRN: [id number] DOB: [birth year]    ADMISSION DATE:  02/17/2016  BRIEF HISTORY: 70 year old female past medical history of cirrhosis, diabetes mellitus allergy varices, GERD, hepatitis C, hyperlipidemia presenting with hypotension, altered mental status, lethargy, requiring Levophed and transferred to ICU. History per chart review and by the bedside. Brother stated that for the past 2-3 today she's been more lethargic than usual, she has not been eating much, no fever, no chills, no sweats. This morning she was noted to be severely altered, EMS was called, and she was brought to the ER where her blood glucose level was less than 10, she was given 3 Amp of D50 and started on a D10 drip. She was also noted to be severely hypotensive was given 2 L of fluids and not responding, therefore a right IJ central line was placed by ER physician and she was started on levo fed. She is currently a resident of [facility]. Review of chart shows recent admission for abdominal pain with ascites, 2.8 L of clear fluid removed and she was discharged on 5 days of an panic antibiotic with Levaquin.   SUBJECTIVE:  Worsening ammonia level this and lethargy, now with severe metabolic acidosis and renal failure, on NRB, responsive to sternal rub, DNR/DNI, hypothermic on bear hugger, and worsening hypotension. Overall, worsening clinical status.    SIGNIFICANT EVENTS: 12/11> Lethargic, hypotensive, hypoglycemic, require 3 A of D50 and a bolus of D10, placed on levo fed, transferred to ICU 12/12 met acidosis, bicarb gtt, levo/vaso, worsening clinical   VITAL SIGNS: Temp:  [94 F (34.4 C)-96.4 F (35.8 C)] 95.2 F (35.1 C) (12/12 0800) Pulse Rate:  [35-98] 98 (12/12 0800) Resp:  [10-15] 14 (12/12 0800) BP: (70-128)/(34-87) 100/53 (12/12 0800) SpO2:  [91 %-100 %] 100 % (12/12 0800) FiO2 (%):  [21 %-100 %] 100 % (12/12 0721) Weight:  [134 lb (60.8  kg)-160 lb 4.4 oz (72.7 kg)] 160 lb 4.4 oz (72.7 kg) (12/12 0451) HEMODYNAMICS: CVP:  [4 mmHg-67 mmHg] 12 mmHg VENTILATOR SETTINGS: FiO2 (%):  [21 %-100 %] 100 % INTAKE / OUTPUT:  Intake/Output Summary (Last 24 hours) at 01/31/16 0857 Last data filed at 02/18/2016 1951  Gross per 24 hour  Intake             3200 ml  Output              650 ml  Net             2550 ml    Review of Systems  Unable to perform ROS: Critical illness    Physical Exam  Constitutional:  Critically ill appearing  HENT:  Scleral icterus  Eyes: Right eye exhibits no discharge. Left eye exhibits no discharge.  Cardiovascular: Normal rate and normal heart sounds.   No murmur heard. Pulmonary/Chest: She has no wheezes. She has rales.  On NRB Mild resp distress  Abdominal: She exhibits distension. There is no tenderness.  Musculoskeletal: She exhibits no edema.  Nursing note and vitals reviewed.    LABS:  CBC  Recent Labs Lab 02/09/2016 1034 01/31/16 0413  WBC 5.9 3.9  HGB 12.7 12.2  HCT 37.6 35.9  PLT 101* 100*   Coag's  Recent Labs Lab 02/04/2016 1034 01/31/16 0413  APTT 41*  --   INR 2.10 2.79   BMET  Recent Labs Lab 02/09/2016 1034 02/18/2016 2030 01/31/16 0413  NA 132* 130* 130*  K 5.7* 4.1 3.9  CL 111 110 111  CO2 11* 12* 12*  BUN 47* 46* 45*  CREATININE 3.23* 3.15* 3.23*  GLUCOSE 49* 241* 184*   Electrolytes  Recent Labs Lab 01/29/2016 1034 01/22/2016 2030 01/31/16 0413  CALCIUM 8.0* 7.3* 7.3*  MG  --   --  1.2*  PHOS  --   --  3.9   Sepsis Markers  Recent Labs Lab 01/31/2016 1034 02/19/2016 1415 02/03/2016 2030 01/31/16 0413  LATICACIDVEN 2.1* 2.4* 3.6*  --   PROCALCITON 0.19  --   --  0.23   ABG  Recent Labs Lab 01/31/16 0621 01/31/16 0830  PHART 7.19* 7.33*  PCO2ART 26* 28*  PO2ART 126* 101   Liver Enzymes  Recent Labs Lab 01/27/2016 1034 01/31/16 0413  AST 62* 54*  ALT 22 20  ALKPHOS 118 115  BILITOT 2.1* 1.9*  ALBUMIN 1.7* 1.5*   Cardiac  Enzymes  Recent Labs Lab 01/27/2016 1034  TROPONINI 0.06*   Glucose  Recent Labs Lab 02/15/2016 1146 02/13/2016 1511 02/08/2016 1937 01/31/2016 2334 01/31/16 0356 01/31/16 0716  GLUCAP 171* 212* 212* 194* 183* 134*    Imaging Dg Abd 1 View  Result Date: 01/31/2016 CLINICAL DATA:  Abdominal distention. EXAM: ABDOMEN - 1 VIEW COMPARISON:  No prior. FINDINGS: NG tube noted with its tip projected over the stomach. Soft tissue structures are unremarkable. No bowel distention. No free air. No acute bony abnormality. Bibasilar atelectasis. IMPRESSION: 1. NG tube noted with its tip projected over stomach. No bowel distention. No acute intra-abdominal abnormality. 2.  Bibasilar atelectasis. Electronically Signed   By: Marcello Moores  Register   On: 01/31/2016 06:59   Ct Head Wo Contrast  Result Date: 02/07/2016 CLINICAL DATA:  70 year old female with cirrhosis, decreased mental status/unresponsive. Initial encounter. EXAM: CT HEAD WITHOUT CONTRAST TECHNIQUE: Contiguous axial images were obtained from the base of the skull through the vertex without intravenous contrast. COMPARISON:  Head CT without contrast 06/10/2015 and earlier. FINDINGS: Brain: No midline shift, mass effect, or evidence of intracranial mass lesion. No ventriculomegaly. Stable cerebral volume. Mild for age scattered bilateral cerebral white matter hypodensity appears stable. Otherwise Gray-white matter differentiation is within normal limits throughout the brain. No cortical encephalomalacia identified. No acute intracranial hemorrhage identified. No cortically based acute infarct identified. Vascular: Calcified atherosclerosis at the skull base. Skull: No acute osseous abnormality identified. Sinuses/Orbits: Visualized paranasal sinuses and mastoids are stable and well pneumatized. Other: No acute orbit or scalp soft tissue findings. IMPRESSION: Stable and largely unremarkable for age noncontrast CT appearance of the brain. Electronically  Signed   By: Genevie Ann M.D.   On: 01/31/2016 15:06   Dg Chest Port 1 View  Result Date: 01/31/2016 CLINICAL DATA:  Right central line pulled back. Cirrhosis, varices, hepatitis. EXAM: PORTABLE CHEST 1 VIEW COMPARISON:  01/01/2016 CXR FINDINGS: Right IJ central line catheter is now noted with its tip at the cavoatrial junction. No pneumothorax. Right-sided PICC line tip is seen in the distal SVC just proximal to the tip of the right IJ catheter. Left mid lung atelectasis. Confluent airspace opacity in the right lower lobe consistent with pneumonia. Heart is normal in size. The aorta is ectatic and atherosclerotic. Gastric tube extends into the stomach. The patient is status post cholecystectomy. Chronic degenerative change noted about both shoulders. IMPRESSION: Right lower lobe pneumonia with left mid lung atelectasis. Satisfactory support line and tube positions. Electronically Signed   By: Ashley Royalty M.D.   On: 01/31/2016 01:32   Dg Chest [facility] [address]  Result Date: 02/17/2016 CLINICAL DATA:  Acute respiratory failure EXAM: PORTABLE CHEST 1 VIEW COMPARISON:  01/27/2016 CXR at 1602 hours FINDINGS: The heart is top-normal in size. The thoracic aorta is ectatic and there is aortic atherosclerosis noted. Diffuse interstitial prominence is noted with mild vascular congestion. Superimposed pulmonary consolidations are not entirely excluded in the right lower lobe and about the left hilum. Gastric tube extends into the stomach. Right IJ catheter extends into the right atrium approximately 5 cm beyond the cavoatrial junction. Right-sided PICC line tip is seen in the distal SVC. No acute osseous abnormality. Cholecystectomy clips are seen in the right upper quadrant. IMPRESSION: 1. Possible evolving pneumonia in the left perihilar and right lower lobe distribution. 2. Right IJ catheter is seen in the right atrium. 3. Right-sided PICC line tip in the distal SVC. 4. Gastric tube extends into the stomach. 5. Aortic  atherosclerosis. Electronically Signed   By: Ashley Royalty M.D.   On: 02/05/2016 23:50   Dg Chest Port 1 View  Result Date: 02/08/2016 CLINICAL DATA:  Central line placement EXAM: PORTABLE CHEST 1 VIEW COMPARISON:  01/23/2016 FINDINGS: Central line as the for that with the tip at the cavoatrial junction. Right PICC line tip in the lower SVC. No pneumothorax. Heart is normal size. Slight improvement in aeration with decreasing right base atelectasis. No focal opacity on the left. No effusions. IMPRESSION: Right central line tip at the cavoatrial junction. Right PICC line tip in the lower SVC. Decreasing right base atelectasis. Electronically Signed   By: Rolm Baptise M.D.   On: 01/24/2016 16:24   Dg Chest Port 1 View  Result Date: 01/23/2016 CLINICAL DATA:  Confusion and hypotension EXAM: PORTABLE CHEST 1 VIEW COMPARISON:  December 08, 2015 Right jugular catheter tip is in the right atrium. Right subclavian catheter tip is in the superior vena cava. No pneumothorax. There is no edema or consolidation. Heart size and pulmonary vascularity are normal. No adenopathy. There is atherosclerotic calcification in the aorta. Bones are osteoporotic. IMPRESSION: Central catheters as described without pneumothorax. No edema or consolidation. Aortic atherosclerosis. Bones osteoporotic. Electronically Signed   By: Lowella Grip III M.D.   On: 01/20/2016 12:17   Dg Abd Portable 1v  Result Date: 02/10/2016 CLINICAL DATA:  OG tube placement EXAM: PORTABLE ABDOMEN - 1 VIEW COMPARISON:  None. FINDINGS: Enteric to is in place with the tip in the right upper abdomen, possibly in the distal stomach or proximal duodenum. Nonobstructive bowel gas pattern. IMPRESSION: Enteric tube tip in the peripyloric region, possibly proximal duodenum or distal stomach. Electronically Signed   By: Rolm Baptise M.D.   On: 01/24/2016 16:23    Cultures: BCx2 12/11 UC 12/11 Sputum  Antibiotics: Vanc 12/11 Meropenem  12/11  Lines: RIJ 12/11 (ER)  Discussion: 70 year old female past medical history of liver cirrhosis, diabetes, ascites, recent admission for abdominal pain with 2.8 L of fluid removed. Now with sepsis of a known etiology, hypertension, altered mental status, elevated ammonia level, on levophed  ASSESSMENT / PLAN: PULMONARY Mild respiratory distress  Hypoithermia P:   Cont with NRB, maintain sats >88% DNR/DNI Cont with blanket warmer, cont with abx.   CARDIOVASCULAR CVL RIJ  Hypotension P:  Cont with pressor(s) as needed to maintain MAP>65 Hemodynamic monitoring  RENAL AKI Met acidosis Hyponatremia hypomagnesemia Dehydration P:   Cont with NaHCO3 Monitor BMP Appreciate Nephro consult OGT for FEN and meds  GASTROINTESTINAL Hx of Liver Cirrhosis Ascities Hyperammonemia  P:   PUD Place NGT for  FEN and meds Will give lactulose via tube and rectally NH4 102> 196 Plan for US Paracentesis  HEMATOLOGIC Hx of Thrombocytopenia P:  CBC  INFECTIOUS Sepsis -unknown etiology, ?SBP P:   Cont with above abx Bedside spot U/S with moderate amount of fluid, will plan for U/S guided paracentesis Plan of paracentesis (diagnostic) today, if stable enough  ENDOCRINE DM 2 hypoglycemia P:   - hold metformin, especially with AKI - glucose checks  NEUROLOGIC AMS Metabolic encephalopathy P:   RASS goal: 0 CT head 12/11>no acute findings.  Lactulose for elevated ammonia levels  Family -called and updated brother about worsening clinical condition, and current poor prognosis, with high risk for cardiac arrest and death. Brother voiced understanding.   CODE Status -DNR/DNI   Worsening ammonia level this and lethargy, now with severe metabolic acidosis and renal failure, on NRB, responsive to sternal rub, DNR/DNI, hypothermic on bear hugger, and worsening hypotension. Overall, worsening clinical status.    Thank you for consulting [facility], Please feel free to contacts [place] with any questions at [phone] (please enter 7-digits).  I have personally obtained a history, examined the patient, evaluated laboratory and imaging results, formulated the assessment and plan and placed orders.  The Patient requires high complexity decision making for assessment and support, frequent evaluation and titration of therapies, application of advanced monitoring technologies and extensive interpretation of multiple databases. Critical Care Time devoted to patient care services described in this note is 45 minutes.   Overall, patient is critically ill, prognosis is guarded. Patient at high risk for cardiac arrest and death.    Vilinda Boehringer, MD [facility] Pager [phone] (please enter 7-digits) On Call Pager [phone] (please enter 7-digits)  Note: This note was prepared with Dragon dictation along with smaller phrase technology. Any transcriptional errors that result from this process are unintentional.

## 2016-01-31 NOTE — Procedures (Signed)
US guided paracentesis, diagnositic procedure to rule out SBP.  Removed 700 ml of yellow fluid from left side of abdomen.  Did not remove more fluid due to low blood pressure.

## 2016-01-31 NOTE — Progress Notes (Signed)
Discussed case with patient's caregiver (Brother, Vicente Serene).  I explained the patient has experienced a progressive downward course over the past few months, and now has a critical status with a poor prognosis.    A further downward course is predictable and likely, will likely involve further hospital admissions, intubation, with further decline in functional status.  Pt's caregiver will discuss the situation with her family, including her son. We decided that patient's code status would continue to be DNR/DNI, continue with full medical treatment for another 2 days, and if no improvement by then, we will readdress goals of care.   Discussion time - 42mins  - Vilinda Boehringer, M.D. Time spent in discussion 30 min.  01/31/2016

## 2016-01-31 NOTE — Progress Notes (Signed)
[place] for electrolyte management    Pharmacy consulted for electrolyte management for 70 yo female admitted with altered mental status and lethargy. Patient's past medical history is significant for liver cirrhosis requiring chronic lactulose. Patient has had multiple large loose bowel movements today.   Plan:   Patient received magnesium 4g IV x 1. Will recheck potassium, phosphorus, & magnesium at 1800.    Allergies  Allergen Reactions  . Penicillins Swelling and Other (See Comments)    Reaction:  Facial swelling  Has patient had a PCN reaction causing immediate rash, facial/tongue/throat swelling, SOB or lightheadedness with hypotension: Yes Has patient had a PCN reaction causing severe rash involving mucus membranes or skin necrosis: No Has patient had a PCN reaction that required hospitalization No Has patient had a PCN reaction occurring within the last 10 years: No If all of the above answers are "NO", then may proceed with Cephalosporin use.    Patient Measurements: Height: 5\' 5"  (165.1 cm) Weight: 160 lb 4.4 oz (72.7 kg) IBW/kg (Calculated) : 57  Vital Signs: Temp: 98.2 F (36.8 C) (12/12 1600) BP: 118/57 (12/12 1600) Pulse Rate: 92 (12/12 1600) Intake/Output from previous day: 12/11 0701 - 12/12 0700 In: 3200 [IV Piggyback:3200] Out: 650 [Urine:650] Intake/Output from this shift: Total I/O In: -  Out: 35 [Urine:35]  Labs:  Recent Labs  02/06/2016 1034 02/09/2016 2030 01/31/16 0413  WBC 5.9  --  3.9  HGB 12.7  --  12.2  HCT 37.6  --  35.9  PLT 101*  --  100*  APTT 41*  --   --   CREATININE 3.23* 3.15* 3.23*  MG  --   --  1.2*  PHOS  --   --  3.9  ALBUMIN 1.7*  --  1.5*  PROT 5.8*  --  5.4*  AST 62*  --  54*  ALT 22  --  20  ALKPHOS 118  --  115  BILITOT 2.1*  --  1.9*   Estimated Creatinine Clearance: 15.7 mL/min (by C-G formula based on SCr of 3.23 mg/dL (H)).  Pharmacy will continue to monitor and  adjust per consult.    Codee Bloodworth L 01/31/2016,4:53 PM

## 2016-01-31 NOTE — H&P (Signed)
Per Dr. Stevenson Clinch would like to proceed with paracentesis but requested procedure to be done at bedside. Called [phone] to relay message. Dr will contact Dr. Stevenson Clinch regarding how stable patient is in for the procedure.

## 2016-01-31 NOTE — Progress Notes (Signed)
Pharmacy Antibiotic Note  Gloria Cole is a 70 y.o. female admitted on 01/25/2016 with sepsis.  Pharmacy has been consulted for meropenem and vancomycin dosing.  Plan: 1. Meropenem 500mg  IV Q12hr.   2. Vancomycin 1000mg  IV Q48hr fo goal trough of 15-20. Will obtain trough as clinically indicated. Per discussion of rounds will wait for gram stain results on peritoneal fluid and reassess appropriateness of vancomycin.    Height: 5\' 5"  (165.1 cm) Weight: 160 lb 4.4 oz (72.7 kg) IBW/kg (Calculated) : 57  Temp (24hrs), Avg:96.3 F (35.7 C), Min:95.2 F (35.1 C), Max:98.4 F (36.9 C)   Recent Labs Lab 02/13/2016 1034 02/13/2016 1415 02/08/2016 2030 01/31/16 0413  WBC 5.9  --   --  3.9  CREATININE 3.23*  --  3.15* 3.23*  LATICACIDVEN 2.1* 2.4* 3.6*  --     Estimated Creatinine Clearance: 15.7 mL/min (by C-G formula based on SCr of 3.23 mg/dL (H)).    Allergies  Allergen Reactions  . Penicillins Swelling and Other (See Comments)    Reaction:  Facial swelling  Has patient had a PCN reaction causing immediate rash, facial/tongue/throat swelling, SOB or lightheadedness with hypotension: Yes Has patient had a PCN reaction causing severe rash involving mucus membranes or skin necrosis: No Has patient had a PCN reaction that required hospitalization No Has patient had a PCN reaction occurring within the last 10 years: No If all of the above answers are "NO", then may proceed with Cephalosporin use.    Antimicrobials this admission: Meropenem 12/11 >>  Vancomycin 12/11 >>   Dose adjustments this admission: N/A  Microbiology results: 12/11 BCx: no growth < 24 hours 12/11 UCx: no growth  12/11 MRSA PCR: negative  Pharmacy will continue to monitor and adjust per consult.    Simpson,Michael L 01/31/2016 4:41 PM

## 2016-01-31 NOTE — Progress Notes (Signed)
Spoke with Dr Candiss Norse regarding patients output of 40cc throughout my shift and 50cc last shift. He will assess patient and review chart. Family contact info in the system.

## 2016-01-31 NOTE — H&P (Signed)
Spoke with Dr Stevenson Clinch regarding patients BP dropping into the 0000000 systolically. Orders entered for Vasopressin.

## 2016-01-31 NOTE — Clinical Social Work Note (Signed)
Clinical Social Work Assessment  Patient Details  Name: Gloria Cole MRN: [id number] Date of Birth: August 20, 1945  Date of referral:  01/31/16               Reason for consult:  Facility Placement                Permission sought to share information with:    Permission granted to share information::     Name::        Agency::     Relationship::     Contact Information:     Housing/Transportation Living arrangements for the past 2 months:  [place] of Information:   (Brother and Sister in [place]) Patient Interpreter Needed:    Criminal Activity/Legal Involvement Pertinent to Current Situation/Hospitalization:    Significant Relationships:  Siblings Lives with:  Facility Resident Do you feel safe going back to the place where you live?  Yes Need for family participation in patient care:  Yes (Comment)  Care giving concerns:  Patient resides at [facility] and has been there since 12/12/15.   Social Worker assessment / plan:  Patient's brother asked to speak with CSW yesterday and informed CSW that his sister would be admitted from ED to ICU. He stated she has been at [facility] under her [employer] but patient's brother explained that the business office at [facility] spoke with him last week and stated that because patient was unable to participate in PT and was not improving that medicare uhc was going to stop covering her and that patient would have to pay out of pocket. Patient's brother was wanting to give CSW a heads up and stated that she may need to go to another facility. Patient's brother is the only local relative and patient's son is out of state and has only seen his mother a few times.   CSW contacted [facility] and spoke with Broadus John who stated that patient does not yet qualify for medicaid due to her having to a large saving's account and that she would have to spend down before she could qualify. [facility] stated that they could take  patient back at discharge and after patient spends down, she would then be able to transition to [facility].   CSW returned to inform patient's brother of the above information. CSW also provided contact information to [place] office and provided a list of facilities as well as an approximation of what each facility charges out of pocket in the event they wish to change facilities. CSW also informed patient's brother that if PT assesses patient on this stay, and patient can participate, then Broadus John at [facility] stated that they would attempt to continue the medicare uhc coverage.  Employment status:  Disabled (Comment on whether or not currently receiving Disability) Insurance information:  Managed Medicare PT Recommendations:  Not assessed at this time Information / Referral to community resources:     Patient/Family's Response to care:  Patient's brother expressed appreciation for CSW assistance.   Patient/Family's Understanding of and Emotional Response to Diagnosis, Current Treatment, and Prognosis: CSW met with patient's brother this morning in patient's ICU room and he informed CSW that his sister was not doing well. He explained that certain organs are failing.   Emotional Assessment Appearance:  Appears stated age Attitude/Demeanor/Rapport:  Lethargic Affect (typically observed):  Unable to Assess Orientation:  Fluctuating Orientation (Suspected and/or reported Sundowners) Alcohol / Substance use:  Not Applicable Psych involvement (Current and /or in the  community):  No (Comment)  Discharge Needs  Concerns to be addressed:  Care Coordination Readmission within the last 30 days:  No Current discharge risk:  None Barriers to Discharge:  No Barriers Identified   Shela Leff, LCSW 01/31/2016, 10:48 AM

## 2016-01-31 NOTE — Consult Note (Signed)
Date: 01/31/2016                  Patient Name:  Gloria Cole  MRN: [id number]  DOB: 01/16/1946  Age / Sex: 70 y.o., female         PCP: Kirk Ruths., MD                 Service Requesting Consult: Internal medicine/CCU                  Reason for Consult: Acute renal failure             History of Present Illness: Patient is a 70 y.o. female with medical problems of Hepatitis C, cirrhosis of the liver, diabetes, gastroesophageal varices, who was admitted to [facility] on 02/11/2016 for evaluation of altered mental status.  Patient is a resident of [facility]. She has been there for about 2 months. She requires total care and is completely dependent on her activities of daily living. All information is obtained from the chart and patient's brother. She was brought in from the nursing home because she was lethargic more than usual with poor appetite. In the ER, blood glucose was noted to be less than 10. She was also severely hypotensive and given volume resuscitation. She was subsequently admitted to the ICU for further evaluation and management. She underwent paracentesis earlier today. 700 mL of yellow fluid was removed. At present, patient is lethargic. She did not respond to any questions..  Earlier today, patient was severely acidotic with pH of 7.19/PCO2 of 26, serum bicarbonate level of 12   Medications: Outpatient medications: Prescriptions Prior to Admission  Medication Sig Dispense Refill Last Dose  . cyanocobalamin 500 MCG tablet Take 500 mcg by mouth every other day.    unknown at unknown  . Diclofenac Sodium 3 % GEL Place 2 g onto the skin 3 (three) times daily.   unknown at unknown  . dicyclomine (BENTYL) 10 MG capsule Take 10 mg by mouth every 6 (six) hours as needed for spasms.   PRN at PRN  . fluticasone (FLONASE) 50 MCG/ACT nasal spray Place 2 sprays into both nostrils daily.   unknown at unknown  . folic acid (FOLVITE) 1 MG tablet  Take 0.5 tablets (0.5 mg total) by mouth daily.   unknown at unknown  . gabapentin (NEURONTIN) 100 MG capsule Take 100 mg by mouth 2 (two) times daily.   unknown at unknown  . lactulose (CHRONULAC) 10 GM/15ML solution Take 45 mLs (30 g total) by mouth 3 (three) times daily. (Patient taking differently: Take 30 g by mouth 4 (four) times daily. ) 946 mL 0 unknown at unknown  . loratadine (CLARITIN) 10 MG tablet Take 10 mg by mouth daily as needed for allergies or rhinitis.   PRN at PRN  . Multiple Vitamin (MULTIVITAMIN WITH MINERALS) TABS tablet Take 1 tablet by mouth daily.   unknown at unknown  . nadolol (CORGARD) 20 MG tablet Take 20 mg by mouth daily.    unknown at unknown  . nicotine (NICODERM CQ - DOSED IN MG/24 HOURS) 14 mg/24hr patch Place 1 patch (14 mg total) onto the skin daily. 28 patch 0 unknown at unknown  . ondansetron (ZOFRAN-ODT) 4 MG disintegrating tablet Take 4 mg by mouth every 8 (eight) hours as needed for nausea or vomiting.   PRN at PRN  . pantoprazole (PROTONIX) 40 MG tablet Take 40 mg by mouth daily.    unknown  at unknown  . pravastatin (PRAVACHOL) 40 MG tablet Take 40 mg by mouth at bedtime.    unknown at unknown  . rifaximin (XIFAXAN) 550 MG TABS tablet Take 550 mg by mouth 2 (two) times daily.   unknown at unknown  . spironolactone (ALDACTONE) 50 MG tablet Take 50 mg by mouth daily.    unknown at unknown  . traMADol (ULTRAM) 50 MG tablet Take 1 tablet (50 mg total) by mouth every 8 (eight) hours as needed for moderate pain (for pain). (Patient taking differently: Take 50 mg by mouth every 8 (eight) hours as needed for moderate pain. ) 30 tablet 0 PRN at PRN  . witch hazel-glycerin (TUCKS) pad Apply topically as needed for itching. (Patient taking differently: Apply 1 application topically as needed for itching. Apply to rectum) 40 each 12 PRN at PRN  . levofloxacin (LEVAQUIN) 500 MG tablet Take 1 tablet (500 mg total) by mouth daily. (Patient not taking: Reported on  01/24/2016) 4 tablet 0 Not Taking at Unknown time  . nystatin (MYCOSTATIN) 100000 UNIT/ML suspension Use as directed 5 mLs (500,000 Units total) in the mouth or throat 4 (four) times daily. (Patient not taking: Reported on 02/05/2016) 60 mL 0 Not Taking at Unknown time    Current medications: Current Facility-Administered Medications  Medication Dose Route Frequency Provider Last Rate Last Dose  . 0.9 %  sodium chloride infusion  250 mL Intravenous PRN Vishal Mungal, MD      . famotidine (PEPCID) IVPB 20 mg premix  20 mg Intravenous Q24H Vishal Mungal, MD   20 mg at 01/31/16 1230  . feeding supplement (VITAL 1.5 CAL) liquid 1,000 mL  1,000 mL Per Tube Continuous Vishal Mungal, MD 20 mL/hr at 01/31/16 1230 1,000 mL at 01/31/16 1230  . heparin injection 5,000 Units  5,000 Units Subcutaneous Q8H Vishal Mungal, MD   5,000 Units at 01/31/16 1406  . insulin aspart (novoLOG) injection 0-9 Units  0-9 Units Subcutaneous Q4H Vilinda Boehringer, MD   1 Units at 01/31/16 1628  . ipratropium-albuterol (DUONEB) 0.5-2.5 (3) MG/3ML nebulizer solution 3 mL  3 mL Nebulization BID Vishal Mungal, MD   3 mL at 01/31/16 0721  . lactulose (CHRONULAC) 10 GM/15ML solution 30 g  30 g Oral QID Vishal Mungal, MD      . meropenem (MERREM) 500 mg in sodium chloride 0.9 % 50 mL IVPB  500 mg Intravenous Q12H Vishal Mungal, MD   500 mg at 01/31/16 1628  . norepinephrine (LEVOPHED) 16 mg in dextrose 5 % 250 mL (0.064 mg/mL) infusion  0-40 mcg/min Intravenous Titrated Vishal Mungal, MD 28.1 mL/hr at 01/31/16 1632 30 mcg/min at 01/31/16 1632  . sodium bicarbonate 150 mEq in sterile water 1,000 mL infusion   Intravenous Continuous Awilda Bill, NP 75 mL/hr at 01/31/16 0751    . [START ON 02/01/2016] vancomycin (VANCOCIN) IVPB 1000 mg/200 mL premix  1,000 mg Intravenous Q48H Vishal Mungal, MD      . vasopressin (PITRESSIN) 40 Units in sodium chloride 0.9 % 250 mL (0.16 Units/mL) infusion  0.03 Units/min Intravenous Continuous Vishal  Mungal, MD 11.3 mL/hr at 01/31/16 0929 0.03 Units/min at 01/31/16 0929      Allergies: Allergies  Allergen Reactions  . Penicillins Swelling and Other (See Comments)    Reaction:  Facial swelling  Has patient had a PCN reaction causing immediate rash, facial/tongue/throat swelling, SOB or lightheadedness with hypotension: Yes Has patient had a PCN reaction causing severe rash involving mucus membranes or skin  necrosis: No Has patient had a PCN reaction that required hospitalization No Has patient had a PCN reaction occurring within the last 10 years: No If all of the above answers are "NO", then may proceed with Cephalosporin use.      Past Medical History: Past Medical History:  Diagnosis Date  . Cirrhosis ([place])   . Diabetes ([place]) 2006  . Esophageal varices ([place]) March 2014  . GERD (gastroesophageal reflux disease)   . Hepatitis   . History of encephalopathy   . History of hepatitis C   . Hyperlipidemia associated with type 2 diabetes mellitus ([place])   . Hypertension   . Portal hypertensive gastropathy      Past Surgical History: Past Surgical History:  Procedure Laterality Date  . ABDOMINAL HYSTERECTOMY  1994  . BREAST BIOPSY Right 1995   neg  . CHOLECYSTECTOMY  April 2014   Dr Bary Castilla  . COLONOSCOPY  2010  . COLONOSCOPY WITH PROPOFOL N/A 03/04/2015   Procedure: COLONOSCOPY WITH PROPOFOL;  Surgeon: Hulen Luster, MD;  Location: [facility] ENDOSCOPY;  Service: Gastroenterology;  Laterality: N/A;  . ERCP  April 2014   Dr Candace Cruise  . ESOPHAGOGASTRODUODENOSCOPY (EGD) WITH PROPOFOL N/A 10/07/2014   Procedure: ESOPHAGOGASTRODUODENOSCOPY (EGD) WITH PROPOFOL;  Surgeon: Hulen Luster, MD;  Location: [facility] ENDOSCOPY;  Service: Gastroenterology;  Laterality: N/A;  . ESOPHAGOGASTRODUODENOSCOPY (EGD) WITH PROPOFOL N/A 12/16/2014   Procedure: ESOPHAGOGASTRODUODENOSCOPY (EGD) WITH PROPOFOL;  Surgeon: Hulen Luster, MD;  Location: [facility] ENDOSCOPY;  Service: Gastroenterology;  Laterality: N/A;  .  ESOPHAGOGASTRODUODENOSCOPY (EGD) WITH PROPOFOL N/A 03/04/2015   Procedure: ESOPHAGOGASTRODUODENOSCOPY (EGD) WITH PROPOFOL;  Surgeon: Hulen Luster, MD;  Location: [facility] ENDOSCOPY;  Service: Gastroenterology;  Laterality: N/A;  . UPPER GI ENDOSCOPY  2014     Family History: Family History  Problem Relation Age of Onset  . Diabetes Sister   . Diabetes Brother   . CAD Mother   . CVA Father   . Hypertension Father   . Diabetes Father      Social History: Social History   Social History  . Marital status: Widowed    Spouse name: N/A  . Number of children: N/A  . Years of education: N/A   Occupational History  . Not on file.   Social History Main Topics  . Smoking status: Current Some Day Smoker    Packs/day: 0.25    Years: 30.00    Types: Cigarettes  . Smokeless tobacco: Never Used  . Alcohol use No     Comment: occasionally  . Drug use: No  . Sexual activity: Not on file   Other Topics Concern  . Not on file   Social History Narrative  . No narrative on file     Review of Systems: Not available as patient is lethargic and has altered mental status Gen:  HEENT:  CV:  Resp:  GI: GU :  MS:  Derm:   Psych: Heme:  Neuro:  Endocrine  Vital Signs: Blood pressure (!) 118/57, pulse 92, temperature 98.2 F (36.8 C), resp. rate 16, height 5\' 5"  (1.651 m), weight 72.7 kg (160 lb 4.4 oz), SpO2 100 %.   Intake/Output Summary (Last 24 hours) at 01/31/16 1653 Last data filed at 01/31/16 1230  Gross per 24 hour  Intake                0 ml  Output              385 ml  Net             -  385 ml    Weight trends: Filed Weights   01/25/2016 1037 02/18/2016 1600 01/31/16 0451  Weight: 60.8 kg (134 lb) 68 kg (149 lb 14.6 oz) 72.7 kg (160 lb 4.4 oz)    Physical Exam: General:  Chronically ill-appearing, lying in the bed   HEENT Nonrebreather mask in place, scleral icterus, pupils are round and reactive to light   Neck:  Supple   Lungs: Coarse breath sounds bilaterally,  nonrebreather oxygen supplementation   Heart::  Irregular, no rub or gallop   Abdomen: Distended, nontender, soft   Extremities:  2+ dependent generalized edema, subcutaneous edema   Neurologic: Lethargic, did not respond to questions or follow commands   Skin: No acute rashes      Foley: Present with dark yellow urine        Lab results: Basic Metabolic Panel:  Recent Labs Lab 01/22/2016 1034 02/08/2016 2030 01/31/16 0413  NA 132* 130* 130*  K 5.7* 4.1 3.9  CL 111 110 111  CO2 11* 12* 12*  GLUCOSE 49* 241* 184*  BUN 47* 46* 45*  CREATININE 3.23* 3.15* 3.23*  CALCIUM 8.0* 7.3* 7.3*  MG  --   --  1.2*  PHOS  --   --  3.9    Liver Function Tests:  Recent Labs Lab 01/31/16 0413  AST 54*  ALT 20  ALKPHOS 115  BILITOT 1.9*  PROT 5.4*  ALBUMIN 1.5*    Recent Labs Lab 02/16/2016 1034  LIPASE 34    Recent Labs Lab 01/31/16 0413  AMMONIA 196*    CBC:  Recent Labs Lab 02/16/2016 1034 01/31/16 0413  WBC 5.9 3.9  NEUTROABS 3.8  --   HGB 12.7 12.2  HCT 37.6 35.9  MCV 101.6* 101.4*  PLT 101* 100*    Cardiac Enzymes:  Recent Labs Lab 01/26/2016 1034  TROPONINI 0.06*    BNP: Invalid input(s): POCBNP  CBG:  Recent Labs Lab 01/31/16 0716 01/31/16 0921 01/31/16 1117 01/31/16 1408 01/31/16 1600  GLUCAP 134* 119* 135* 121* 134*    Microbiology: Recent Results (from the past 720 hour(s))  Urine culture     Status: None   Collection Time: 02/07/2016 10:28 AM  Result Value Ref Range Status   Specimen Description URINE, RANDOM  Final   Special Requests NONE  Final   Culture NO GROWTH Performed at [facility]   Final   Report Status 01/31/2016 FINAL  Final  Blood Culture (routine x 2)     Status: None (Preliminary result)   Collection Time: 02/10/2016 10:35 AM  Result Value Ref Range Status   Specimen Description BLOOD RIGHT EJ  Final   Special Requests   Final    BOTTLES DRAWN AEROBIC AND ANAEROBIC AER 6 ML ANA 7 ML   Culture NO GROWTH <  24 HOURS  Final   Report Status PENDING  Incomplete  Blood Culture (routine x 2)     Status: None (Preliminary result)   Collection Time: 01/25/2016 10:35 AM  Result Value Ref Range Status   Specimen Description BLOOD LEFT EJ  Final   Special Requests   Final    BOTTLES DRAWN AEROBIC AND ANAEROBIC  AER 6 ML ANA 4 ML   Culture NO GROWTH < 24 HOURS  Final   Report Status PENDING  Incomplete  MRSA PCR Screening     Status: None   Collection Time: 02/09/2016  4:10 PM  Result Value Ref Range Status   MRSA by PCR NEGATIVE NEGATIVE Final  Comment:        The GeneXpert MRSA Assay (FDA approved for NASAL specimens only), is one component of a comprehensive MRSA colonization surveillance program. It is not intended to diagnose MRSA infection nor to guide or monitor treatment for MRSA infections.   Body fluid culture     Status: None (Preliminary result)   Collection Time: 01/31/16  9:56 AM  Result Value Ref Range Status   Specimen Description PERITONEAL  Final   Special Requests PENDING  Incomplete   Gram Stain   Final    FEW WBC PRESENT,BOTH PMN AND MONONUCLEAR NO ORGANISMS SEEN Performed at [facility]    Culture PENDING  Incomplete   Report Status PENDING  Incomplete     Coagulation Studies:  Recent Labs  02/01/2016 1034 01/31/16 0413  LABPROT 23.9* 30.0*  INR 2.10 2.79    Urinalysis: No results for input(s): COLORURINE, LABSPEC, PHURINE, GLUCOSEU, HGBUR, BILIRUBINUR, KETONESUR, PROTEINUR, UROBILINOGEN, NITRITE, LEUKOCYTESUR in the last 72 hours.  Invalid input(s): APPERANCEUR      Imaging: Dg Abd 1 View  Result Date: 01/31/2016 CLINICAL DATA:  Abdominal distention. EXAM: ABDOMEN - 1 VIEW COMPARISON:  No prior. FINDINGS: NG tube noted with its tip projected over the stomach. Soft tissue structures are unremarkable. No bowel distention. No free air. No acute bony abnormality. Bibasilar atelectasis. IMPRESSION: 1. NG tube noted with its tip projected over  stomach. No bowel distention. No acute intra-abdominal abnormality. 2.  Bibasilar atelectasis. Electronically Signed   By: Marcello Moores  Register   On: 01/31/2016 06:59   Ct Head Wo Contrast  Result Date: 02/05/2016 CLINICAL DATA:  70 year old female with cirrhosis, decreased mental status/unresponsive. Initial encounter. EXAM: CT HEAD WITHOUT CONTRAST TECHNIQUE: Contiguous axial images were obtained from the base of the skull through the vertex without intravenous contrast. COMPARISON:  Head CT without contrast 06/10/2015 and earlier. FINDINGS: Brain: No midline shift, mass effect, or evidence of intracranial mass lesion. No ventriculomegaly. Stable cerebral volume. Mild for age scattered bilateral cerebral white matter hypodensity appears stable. Otherwise Gray-white matter differentiation is within normal limits throughout the brain. No cortical encephalomalacia identified. No acute intracranial hemorrhage identified. No cortically based acute infarct identified. Vascular: Calcified atherosclerosis at the skull base. Skull: No acute osseous abnormality identified. Sinuses/Orbits: Visualized paranasal sinuses and mastoids are stable and well pneumatized. Other: No acute orbit or scalp soft tissue findings. IMPRESSION: Stable and largely unremarkable for age noncontrast CT appearance of the brain. Electronically Signed   By: Genevie Ann M.D.   On: 02/06/2016 15:06   US Paracentesis  Result Date: 01/31/2016 INDICATION: Cirrhotic patient with possible sepsis and hypotension. Request for diagnostic paracentesis. EXAM: ULTRASOUND GUIDED DIAGNOSTIC PARACENTESIS MEDICATIONS: None. COMPLICATIONS: None immediate. PROCEDURE: Patient is unable to give consent. Telephone consent was obtained from the patient's brother. Initial ultrasound scanning demonstrates a large amount of ascites within the left lower abdominal quadrant. The right lower abdomen was prepped and draped in the usual sterile fashion. 1% lidocaine was used  for local anesthesia. Following this, a 6 Fr Safe-T-Centesis catheter was introduced. An ultrasound image was saved for documentation purposes. The paracentesis was performed. The catheter was removed and a dressing was applied. The patient tolerated the procedure well without immediate post procedural complication. FINDINGS: A total of approximately 700 mL of yellow fluid was removed. Samples were sent to the laboratory as requested by the clinical team. Additional fluid was not removed due to hypotension. IMPRESSION: Successful ultrasound-guided paracentesis yielding 700 mL of peritoneal fluid. Electronically  Signed   By: Markus Daft M.D.   On: 01/31/2016 11:10   Dg Chest Port 1 View  Result Date: 01/31/2016 CLINICAL DATA:  Right central line pulled back. Cirrhosis, varices, hepatitis. EXAM: PORTABLE CHEST 1 VIEW COMPARISON:  01/01/2016 CXR FINDINGS: Right IJ central line catheter is now noted with its tip at the cavoatrial junction. No pneumothorax. Right-sided PICC line tip is seen in the distal SVC just proximal to the tip of the right IJ catheter. Left mid lung atelectasis. Confluent airspace opacity in the right lower lobe consistent with pneumonia. Heart is normal in size. The aorta is ectatic and atherosclerotic. Gastric tube extends into the stomach. The patient is status post cholecystectomy. Chronic degenerative change noted about both shoulders. IMPRESSION: Right lower lobe pneumonia with left mid lung atelectasis. Satisfactory support line and tube positions. Electronically Signed   By: Ashley Royalty M.D.   On: 01/31/2016 01:32   Dg Chest Port 1 View  Result Date: 02/04/2016 CLINICAL DATA:  Acute respiratory failure EXAM: PORTABLE CHEST 1 VIEW COMPARISON:  02/17/2016 CXR at 1602 hours FINDINGS: The heart is top-normal in size. The thoracic aorta is ectatic and there is aortic atherosclerosis noted. Diffuse interstitial prominence is noted with mild vascular congestion. Superimposed pulmonary  consolidations are not entirely excluded in the right lower lobe and about the left hilum. Gastric tube extends into the stomach. Right IJ catheter extends into the right atrium approximately 5 cm beyond the cavoatrial junction. Right-sided PICC line tip is seen in the distal SVC. No acute osseous abnormality. Cholecystectomy clips are seen in the right upper quadrant. IMPRESSION: 1. Possible evolving pneumonia in the left perihilar and right lower lobe distribution. 2. Right IJ catheter is seen in the right atrium. 3. Right-sided PICC line tip in the distal SVC. 4. Gastric tube extends into the stomach. 5. Aortic atherosclerosis. Electronically Signed   By: Ashley Royalty M.D.   On: 02/16/2016 23:50   Dg Chest Port 1 View  Result Date: 02/08/2016 CLINICAL DATA:  Central line placement EXAM: PORTABLE CHEST 1 VIEW COMPARISON:  01/29/2016 FINDINGS: Central line as the for that with the tip at the cavoatrial junction. Right PICC line tip in the lower SVC. No pneumothorax. Heart is normal size. Slight improvement in aeration with decreasing right base atelectasis. No focal opacity on the left. No effusions. IMPRESSION: Right central line tip at the cavoatrial junction. Right PICC line tip in the lower SVC. Decreasing right base atelectasis. Electronically Signed   By: Rolm Baptise M.D.   On: 01/29/2016 16:24   Dg Chest Port 1 View  Result Date: 02/05/2016 CLINICAL DATA:  Confusion and hypotension EXAM: PORTABLE CHEST 1 VIEW COMPARISON:  December 08, 2015 Right jugular catheter tip is in the right atrium. Right subclavian catheter tip is in the superior vena cava. No pneumothorax. There is no edema or consolidation. Heart size and pulmonary vascularity are normal. No adenopathy. There is atherosclerotic calcification in the aorta. Bones are osteoporotic. IMPRESSION: Central catheters as described without pneumothorax. No edema or consolidation. Aortic atherosclerosis. Bones osteoporotic. Electronically Signed   By:  Lowella Grip III M.D.   On: 02/12/2016 12:17   Dg Abd Portable 1v  Result Date: 01/26/2016 CLINICAL DATA:  OG tube placement EXAM: PORTABLE ABDOMEN - 1 VIEW COMPARISON:  None. FINDINGS: Enteric to is in place with the tip in the right upper abdomen, possibly in the distal stomach or proximal duodenum. Nonobstructive bowel gas pattern. IMPRESSION: Enteric tube tip in the peripyloric region,  possibly proximal duodenum or distal stomach. Electronically Signed   By: Rolm Baptise M.D.   On: 02/09/2016 16:23      Assessment & Plan: Pt is a 70 y.o. African-American female with Hepatitis C, cirrhosis of the liver, diabetes, gastroesophageal varices, who was admitted to [facility] on 02/19/2016 for evaluation of altered mental status.   Patient is currently critically ill. She also has very poor baseline health. She has known cirrhosis at least since 2014 and is being managed by [facility]. Cirrhosis appears to be nonalcoholic as her brother reports that she has no history of alcohol abuse. She does have hepatitis C.  She has baseline chronic kidney disease with creatinine of 1.40/GFR 42 from October 2017. Over the last 2 months, her health has declined significantly. She has become totally dependent for care and now has to reside in nursing home. She is not able to even get up without assistance according to her brother.  1. Acute renal failure on chronic kidney disease stage III. Baseline creatinine 1.40/GFR 42 from October 2017 2. Liver cirrhosis, decompensated 3. Generalized edema 4. Severe acidosis  Acute renal failure is likely secondary to underlying concurrent illness that has caused decompensation of her liver cirrhosis, altered mental status. She has low urine output and starting to get oliguric. Serum creatinine today is 3.23/GFR 15. Potassium is 3.9. BUN of 45. There is no acute indication for dialysis at present however, with her poor underlying health condition, not sure if she's even able to  tolerate dialysis at all. Due to underlying cirrhosis, she will have significant volume shifts which are difficult to manage and result in adverse outcome for the patient.  At this point, recommend palliative care consult to clarify goals of care. Would also recommend involving [facility] for participation in decision making as she was being actively followed. Further plan as per hospital course.

## 2016-02-01 DIAGNOSIS — K7290 Hepatic failure, unspecified without coma: Secondary | ICD-10-CM

## 2016-02-01 DIAGNOSIS — Z515 Encounter for palliative care: Secondary | ICD-10-CM

## 2016-02-01 DIAGNOSIS — R34 Anuria and oliguria: Secondary | ICD-10-CM

## 2016-02-01 LAB — PH, BODY FLUID: pH, Body Fluid: 7.8

## 2016-02-01 LAB — AMMONIA: Ammonia: 54 umol/L — ABNORMAL HIGH (ref 9–35)

## 2016-02-01 LAB — CBC WITH DIFFERENTIAL/PLATELET
BASOS ABS: 0 10*3/uL (ref 0–0.1)
BASOS PCT: 0 %
EOS ABS: 0 10*3/uL (ref 0–0.7)
EOS PCT: 0 %
HCT: 32.9 % — ABNORMAL LOW (ref 35.0–47.0)
Hemoglobin: 11.3 g/dL — ABNORMAL LOW (ref 12.0–16.0)
LYMPHS PCT: 8 %
Lymphs Abs: 1 10*3/uL (ref 1.0–3.6)
MCH: 34.3 pg — ABNORMAL HIGH (ref 26.0–34.0)
MCHC: 34.3 g/dL (ref 32.0–36.0)
MCV: 99.9 fL (ref 80.0–100.0)
MONO ABS: 0.6 10*3/uL (ref 0.2–0.9)
Monocytes Relative: 5 %
Neutro Abs: 10.8 10*3/uL — ABNORMAL HIGH (ref 1.4–6.5)
Neutrophils Relative %: 87 %
PLATELETS: 89 10*3/uL — AB (ref 150–440)
RBC: 3.3 MIL/uL — AB (ref 3.80–5.20)
RDW: 16.7 % — AB (ref 11.5–14.5)
WBC: 12.5 10*3/uL — AB (ref 3.6–11.0)

## 2016-02-01 LAB — GLUCOSE, CAPILLARY
GLUCOSE-CAPILLARY: 207 mg/dL — AB (ref 65–99)
GLUCOSE-CAPILLARY: 198 mg/dL — AB (ref 65–99)
GLUCOSE-CAPILLARY: 181 mg/dL — AB (ref 65–99)
Glucose-Capillary: 184 mg/dL — ABNORMAL HIGH (ref 65–99)
Glucose-Capillary: 251 mg/dL — ABNORMAL HIGH (ref 65–99)

## 2016-02-01 LAB — BASIC METABOLIC PANEL
ANION GAP: 8 (ref 5–15)
BUN: 46 mg/dL — ABNORMAL HIGH (ref 6–20)
CHLORIDE: 109 mmol/L (ref 101–111)
CO2: 17 mmol/L — ABNORMAL LOW (ref 22–32)
Calcium: 7.4 mg/dL — ABNORMAL LOW (ref 8.9–10.3)
Creatinine, Ser: 3.15 mg/dL — ABNORMAL HIGH (ref 0.44–1.00)
GFR calc Af Amer: 16 mL/min — ABNORMAL LOW (ref >60)
GFR, EST NON AFRICAN AMERICAN: 14 mL/min — AB (ref >60)
Glucose, Bld: 231 mg/dL — ABNORMAL HIGH (ref 65–99)
POTASSIUM: 3.7 mmol/L (ref 3.5–5.1)
SODIUM: 134 mmol/L — AB (ref 135–145)

## 2016-02-01 LAB — MAGNESIUM: MAGNESIUM: 1.9 mg/dL (ref 1.7–2.4)

## 2016-02-01 LAB — CYTOLOGY - NON PAP

## 2016-02-01 LAB — PROCALCITONIN: PROCALCITONIN: 1.46 ng/mL

## 2016-02-01 LAB — MISC LABCORP TEST (SEND OUT): LABCORP TEST CODE: 19588

## 2016-02-01 LAB — PHOSPHORUS: Phosphorus: 4.3 mg/dL (ref 2.5–4.6)

## 2016-02-01 MED ORDER — SODIUM CHLORIDE 0.9% FLUSH
10.0000 mL | Freq: Two times a day (BID) | INTRAVENOUS | Status: DC
  Administered 2016-02-01: 40 mL
  Administered 2016-02-01 – 2016-02-02 (×2): 10 mL

## 2016-02-01 MED ORDER — CHLORHEXIDINE GLUCONATE 0.12 % MT SOLN
15.0000 mL | Freq: Two times a day (BID) | OROMUCOSAL | Status: DC
  Administered 2016-02-01 – 2016-02-02 (×3): 15 mL via OROMUCOSAL
  Filled 2016-02-01 (×2): qty 15

## 2016-02-01 MED ORDER — VITAL 1.5 CAL PO LIQD
1000.0000 mL | ORAL | Status: DC
  Administered 2016-02-01 – 2016-02-02 (×2): 1000 mL

## 2016-02-01 MED ORDER — ORAL CARE MOUTH RINSE
15.0000 mL | Freq: Two times a day (BID) | OROMUCOSAL | Status: DC
  Administered 2016-02-01 – 2016-02-02 (×4): 15 mL via OROMUCOSAL

## 2016-02-01 MED ORDER — SODIUM CHLORIDE 0.9% FLUSH: 10.0000 mL | INTRAVENOUS | Status: DC | PRN

## 2016-02-01 NOTE — Consult Note (Signed)
[facility] Infectious Disease     Reason for Consult: Recurrent SBP   Referring Physician: Gaye Pollack Date of Admission:  02/15/2016   Active Problems:   Sepsis ([place])   Pressure injury of skin   HPI: [name] is a 70 y.o. female with cirrhosis, DM, who lives at [facility]. She has initial admission in Oct with SBP and dced on oral levofloxacin. Readmitted 12/11 with AMS, hypotensive, hypoglycemia with glucose 11.  Per report she had increasing lethargy for several days. Since admit she has been on 2 pressors, is on O2, minimally responsive despite fluid resuscitation and normalization of glucose.   CXR with possible RLL infiltrate.    Past Medical History:  Diagnosis Date  . Cirrhosis ([place])   . Diabetes ([place]) 2006  . 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 GERD (gastroesophageal reflux disease)   . Hepatitis   . History of encephalopathy   . History of hepatitis C   . Hyperlipidemia associated with type 2 diabetes mellitus ([place])   . Hypertension   . Portal hypertensive gastropathy    Past Surgical History:  Procedure Laterality Date  . ABDOMINAL HYSTERECTOMY  1994  . BREAST BIOPSY Right 1995   neg  . CHOLECYSTECTOMY  April 2014   Dr Bary Castilla  . COLONOSCOPY  2010  . COLONOSCOPY WITH PROPOFOL N/A 03/04/2015   Procedure: COLONOSCOPY WITH PROPOFOL;  Surgeon: Hulen Luster, MD;  Location: [facility] ENDOSCOPY;  Service: Gastroenterology;  Laterality: N/A;  . ERCP  April 2014   Dr Candace Cruise  . ESOPHAGOGASTRODUODENOSCOPY (EGD) WITH PROPOFOL N/A 10/07/2014   Procedure: ESOPHAGOGASTRODUODENOSCOPY (EGD) WITH PROPOFOL;  Surgeon: Hulen Luster, MD;  Location: [facility] ENDOSCOPY;  Service: Gastroenterology;  Laterality: N/A;  . ESOPHAGOGASTRODUODENOSCOPY (EGD) WITH PROPOFOL N/A 12/16/2014   Procedure: ESOPHAGOGASTRODUODENOSCOPY (EGD) WITH PROPOFOL;  Surgeon: Hulen Luster, MD;  Location: [facility] ENDOSCOPY;  Service: Gastroenterology;  Laterality: N/A;  . ESOPHAGOGASTRODUODENOSCOPY (EGD) WITH  PROPOFOL N/A 03/04/2015   Procedure: ESOPHAGOGASTRODUODENOSCOPY (EGD) WITH PROPOFOL;  Surgeon: Hulen Luster, MD;  Location: [facility] ENDOSCOPY;  Service: Gastroenterology;  Laterality: N/A;  . UPPER GI ENDOSCOPY  2014   Social History  Substance Use Topics  . Smoking status: Current Some Day Smoker    Packs/day: 0.25    Years: 30.00    Types: Cigarettes  . Smokeless tobacco: Never Used  . Alcohol use No     Comment: occasionally   Family History  Problem Relation Age of Onset  . Diabetes Sister   . Diabetes Brother   . CAD Mother   . CVA Father   . Hypertension Father   . Diabetes Father     Allergies:  Allergies  Allergen Reactions  . Penicillins Swelling and Other (See Comments)    Reaction:  Facial swelling  Has patient had a PCN reaction causing immediate rash, facial/tongue/throat swelling, SOB or lightheadedness with hypotension: Yes Has patient had a PCN reaction causing severe rash involving mucus membranes or skin necrosis: No Has patient had a PCN reaction that required hospitalization No Has patient had a PCN reaction occurring within the last 10 years: No If all of the above answers are "NO", then may proceed with Cephalosporin use.    Current antibiotics: Antibiotics Given (last 72 hours)    Date/Time Action Medication Dose Rate   01/31/2016 1805 Given   meropenem (MERREM) 500 mg in sodium chloride 0.9 % 50 mL IVPB 500 mg 100 mL/hr   01/24/2016 1805 Given   vancomycin (VANCOCIN) IVPB  750 mg/150 ml premix 750 mg 150 mL/hr   01/31/16 0520 Given   meropenem (MERREM) 500 mg in sodium chloride 0.9 % 50 mL IVPB 500 mg 100 mL/hr   01/31/16 1628 Given   meropenem (MERREM) 500 mg in sodium chloride 0.9 % 50 mL IVPB 500 mg 100 mL/hr   02/01/16 0518 Given   meropenem (MERREM) 500 mg in sodium chloride 0.9 % 50 mL IVPB 500 mg 100 mL/hr      MEDICATIONS: . chlorhexidine  15 mL Mouth Rinse BID  . famotidine (PEPCID) IV  20 mg Intravenous Q24H  . heparin subcutaneous  5,000  Units Subcutaneous Q8H  . insulin aspart  0-9 Units Subcutaneous Q4H  . ipratropium-albuterol  3 mL Nebulization BID  . lactulose  30 g Oral QID  . mouth rinse  15 mL Mouth Rinse q12n4p  . meropenem (MERREM) IV  500 mg Intravenous Q12H  . sodium chloride flush  10-40 mL Intracatheter Q12H  . vancomycin  1,000 mg Intravenous Q48H    Review of Systems - unable to obtain    OBJECTIVE: Temp:  [96.6 F (35.9 C)-99.1 F (37.3 C)] 97.9 F (36.6 C) (12/13 1400) Pulse Rate:  [77-92] 78 (12/13 1400) Resp:  [10-16] 11 (12/13 1400) BP: (83-118)/(48-77) 93/61 (12/13 1400) SpO2:  [96 %-100 %] 97 % (12/13 1400) FiO2 (%):  [35 %-100 %] 35 % (12/13 1400) Weight:  [69.1 kg (152 lb 5.4 oz)] 69.1 kg (152 lb 5.4 oz) (12/13 0500) Physical Exam  Constitutional: frail, not awake except to stimulation when she moans HENT: [place]/AT, PERRLA, no scleral icterus Mouth/Throat: Oropharynx is clear and dry  Cardiovascular:tachy reg, 2/6 sm  Pulmonary/Chest: bil rhonchi Neck = supple, no nuchal rigidity Abdominal:obese, mild distention, no obvious tenderness Lymphadenopathy: no cervical adenopathy. No axillary adenopathy Neurological: minimally arousable Skin: multiple brusises Psychiatric: obtunded Lines R neck TLC and R picc line   LABS: Results for orders placed or performed during the hospital encounter of 02/17/2016 (from the past 48 hour(s))  Glucose, capillary     Status: Abnormal   Collection Time: 01/28/2016  3:11 PM  Result Value Ref Range   Glucose-Capillary 212 (H) 65 - 99 mg/dL  MRSA PCR Screening     Status: None   Collection Time: 01/29/2016  4:10 PM  Result Value Ref Range   MRSA by PCR NEGATIVE NEGATIVE    Comment:        The GeneXpert MRSA Assay (FDA approved for NASAL specimens only), is one component of a comprehensive MRSA colonization surveillance program. It is not intended to diagnose MRSA infection nor to guide or monitor treatment for MRSA infections.   Influenza panel by  PCR (type A & B, H1N1)     Status: None   Collection Time: 02/18/2016  4:19 PM  Result Value Ref Range   Influenza A By PCR NEGATIVE NEGATIVE   Influenza B By PCR NEGATIVE NEGATIVE    Comment: (NOTE) The Xpert Xpress Flu assay is intended as an aid in the diagnosis of  influenza and should not be used as a sole basis for treatment.  This  assay is FDA approved for nasopharyngeal swab specimens only. Nasal  washings and aspirates are unacceptable for Xpert Xpress Flu testing.   Glucose, capillary     Status: Abnormal   Collection Time: 02/08/2016  7:37 PM  Result Value Ref Range   Glucose-Capillary 212 (H) 65 - 99 mg/dL  Basic metabolic panel     Status: Abnormal  Collection Time: 02/13/2016  8:30 PM  Result Value Ref Range   Sodium 130 (L) 135 - 145 mmol/L   Potassium 4.1 3.5 - 5.1 mmol/L   Chloride 110 101 - 111 mmol/L   CO2 12 (L) 22 - 32 mmol/L   Glucose, Bld 241 (H) 65 - 99 mg/dL   BUN 46 (H) 6 - 20 mg/dL   Creatinine, Ser 3.15 (H) 0.44 - 1.00 mg/dL   Calcium 7.3 (L) 8.9 - 10.3 mg/dL   GFR calc non Af Amer 14 (L) >60 mL/min   GFR calc Af Amer 16 (L) >60 mL/min    Comment: (NOTE) The eGFR has been calculated using the CKD EPI equation. This calculation has not been validated in all clinical situations. eGFR's persistently <60 mL/min signify possible Chronic Kidney Disease.    Anion gap 8 5 - 15  Lactic acid, plasma     Status: Abnormal   Collection Time: 01/25/2016  8:30 PM  Result Value Ref Range   Lactic Acid, Venous 3.6 (HH) 0.5 - 1.9 mmol/L    Comment: CRITICAL RESULT CALLED TO, READ BACK BY AND VERIFIED WITH DALE HOPKINS 02/10/2016 @ 2128  [place]   Glucose, capillary     Status: Abnormal   Collection Time: 02/11/2016 11:34 PM  Result Value Ref Range   Glucose-Capillary 194 (H) 65 - 99 mg/dL  Glucose, capillary     Status: Abnormal   Collection Time: 01/31/16  3:56 AM  Result Value Ref Range   Glucose-Capillary 183 (H) 65 - 99 mg/dL  CBC     Status: Abnormal   Collection  Time: 01/31/16  4:13 AM  Result Value Ref Range   WBC 3.9 3.6 - 11.0 K/uL   RBC 3.54 (L) 3.80 - 5.20 MIL/uL   Hemoglobin 12.2 12.0 - 16.0 g/dL   HCT 35.9 35.0 - 47.0 %   MCV 101.4 (H) 80.0 - 100.0 fL   MCH 34.4 (H) 26.0 - 34.0 pg   MCHC 33.9 32.0 - 36.0 g/dL   RDW 17.2 (H) 11.5 - 14.5 %   Platelets 100 (L) 150 - 440 K/uL  Magnesium     Status: Abnormal   Collection Time: 01/31/16  4:13 AM  Result Value Ref Range   Magnesium 1.2 (L) 1.7 - 2.4 mg/dL  Phosphorus     Status: None   Collection Time: 01/31/16  4:13 AM  Result Value Ref Range   Phosphorus 3.9 2.5 - 4.6 mg/dL  Procalcitonin     Status: None   Collection Time: 01/31/16  4:13 AM  Result Value Ref Range   Procalcitonin 0.23 ng/mL    Comment:        Interpretation: PCT (Procalcitonin) <= 0.5 ng/mL: Systemic infection (sepsis) is not likely. Local bacterial infection is possible. (NOTE)         ICU PCT Algorithm               Non ICU PCT Algorithm    ----------------------------     ------------------------------         PCT < 0.25 ng/mL                 PCT < 0.1 ng/mL     Stopping of antibiotics            Stopping of antibiotics       strongly encouraged.               strongly encouraged.    ----------------------------     ------------------------------  PCT level decrease by               PCT < 0.25 ng/mL       >= 80% from peak PCT       OR PCT 0.25 - 0.5 ng/mL          Stopping of antibiotics                                             encouraged.     Stopping of antibiotics           encouraged.    ----------------------------     ------------------------------       PCT level decrease by              PCT >= 0.25 ng/mL       < 80% from peak PCT        AND PCT >= 0.5 ng/mL            Continuin g antibiotics                                              encouraged.       Continuing antibiotics            encouraged.    ----------------------------     ------------------------------     PCT level increase  compared          PCT > 0.5 ng/mL         with peak PCT AND          PCT >= 0.5 ng/mL             Escalation of antibiotics                                          strongly encouraged.      Escalation of antibiotics        strongly encouraged.   Protime-INR     Status: Abnormal   Collection Time: 01/31/16  4:13 AM  Result Value Ref Range   Prothrombin Time 30.0 (H) 11.4 - 15.2 seconds   INR 2.79   Comprehensive metabolic panel     Status: Abnormal   Collection Time: 01/31/16  4:13 AM  Result Value Ref Range   Sodium 130 (L) 135 - 145 mmol/L   Potassium 3.9 3.5 - 5.1 mmol/L   Chloride 111 101 - 111 mmol/L   CO2 12 (L) 22 - 32 mmol/L   Glucose, Bld 184 (H) 65 - 99 mg/dL   BUN 45 (H) 6 - 20 mg/dL   Creatinine, Ser 3.23 (H) 0.44 - 1.00 mg/dL   Calcium 7.3 (L) 8.9 - 10.3 mg/dL   Total Protein 5.4 (L) 6.5 - 8.1 g/dL   Albumin 1.5 (L) 3.5 - 5.0 g/dL   AST 54 (H) 15 - 41 U/L   ALT 20 14 - 54 U/L   Alkaline Phosphatase 115 38 - 126 U/L   Total Bilirubin 1.9 (H) 0.3 - 1.2 mg/dL   GFR calc non Af Amer 13 (L) >60 mL/min   GFR calc Af Amer 15 (L) >60 mL/min  Comment: (NOTE) The eGFR has been calculated using the CKD EPI equation. This calculation has not been validated in all clinical situations. eGFR's persistently <60 mL/min signify possible Chronic Kidney Disease.    Anion gap 7 5 - 15  Ammonia     Status: Abnormal   Collection Time: 01/31/16  4:13 AM  Result Value Ref Range   Ammonia 196 (H) 9 - 35 umol/L  Blood gas, arterial     Status: Abnormal   Collection Time: 01/31/16  6:21 AM  Result Value Ref Range   FIO2 1.00    Delivery systems NON-REBREATHER OXYGEN MASK    pH, Arterial 7.19 (LL) 7.350 - 7.450    Comment: CRITICAL RESULT CALLED TO, READ BACK BY AND VERIFIED WITH: DANA BLAKENEY _0    01/31/2016  JCG    pCO2 arterial 26 (L) 32.0 - 48.0 mmHg   pO2, Arterial 126 (H) 83.0 - 108.0 mmHg   Bicarbonate 9.9 (L) 20.0 - 28.0 mmol/L   Acid-base deficit 16.7 (H) 0.0 -  2.0 mmol/L   O2 Saturation 98.0 %   Patient temperature 37.0    Collection site RIGHT RADIAL    Sample type ARTERIAL DRAW    Allens test (pass/fail) PASS PASS  Glucose, capillary     Status: Abnormal   Collection Time: 01/31/16  7:16 AM  Result Value Ref Range   Glucose-Capillary 134 (H) 65 - 99 mg/dL  Blood gas, arterial     Status: Abnormal   Collection Time: 01/31/16  8:30 AM  Result Value Ref Range   FIO2 100.00    Delivery systems NON-REBREATHER OXYGEN MASK    pH, Arterial 7.33 (L) 7.350 - 7.450   pCO2 arterial 28 (L) 32.0 - 48.0 mmHg   pO2, Arterial 101 83.0 - 108.0 mmHg   Bicarbonate 14.8 (L) 20.0 - 28.0 mmol/L   Acid-base deficit 9.8 (H) 0.0 - 2.0 mmol/L   O2 Saturation 97.3 %   Patient temperature 37.0    Collection site LEFT RADIAL    Sample type ARTERIAL DRAW    Allens test (pass/fail) PASS PASS  Glucose, capillary     Status: Abnormal   Collection Time: 01/31/16  9:21 AM  Result Value Ref Range   Glucose-Capillary 119 (H) 65 - 99 mg/dL  Cytology - Non PAP;     Status: None   Collection Time: 01/31/16  9:56 AM  Result Value Ref Range   CYTOLOGY - NON GYN      Cytology - Non PAP CASE: [id number] PATIENT: Emie Ragin Non-Gyn Cytology Report     SPECIMEN SUBMITTED: A. Peritoneal fluid  CLINICAL HISTORY: None Provided  PRE-OPERATIVE DIAGNOSIS: None provided  POST-OPERATIVE DIAGNOSIS: None provided.     DIAGNOSIS: A. PERITONEAL FLUID; ULTRASOUND-GUIDED PARACENTESIS: - ABUNDANT NEUTROPHILS. - LYMPHOCYTES AND REACTIVE MESOTHELIAL CELLS.  Note: Findings may be compatible with spontaneous bacterial peritonitis if correlated clinically. Cell block material and ThinPrep slides were reviewed.   GROSS DESCRIPTION:  A. Specimen Labeled: ascites Volume: 600 mL Description: clear yellow fluid in a 1000 mL glass evacuated container Cell block(s): 1 and 1 ThinPrep Final Diagnosis performed by Delorse Lek, MD.  Electronically signed 02/01/2016  10:14:23AM    The electronic signature indicates that the named Attending Pathologist has evaluated the specimen  Technical component performed at [facility], [address], [place], [place] [address] Lab: [phone] Dir: Darrick Penna. Evette Doffing, MD  Professional component performed at [facility], [facility], [place], [place], [place] [address] Lab: [phone] Dir: Dellia Nims.  Rubinas, MD    Lactate dehydrogenase (CSF, pleural or peritoneal fluid)     Status: None   Collection Time: 01/31/16  9:56 AM  Result Value Ref Range   LD, Fluid 19 3 - 23 U/L    Comment: (NOTE) Results should be evaluated in conjunction with serum values    Fluid Type-FLDH CYTOPERI   Protein, pleural or peritoneal fluid     Status: None   Collection Time: 01/31/16  9:56 AM  Result Value Ref Range   Total protein, fluid <3.0 g/dL    Comment: (NOTE) No normal range established for this test Results should be evaluated in conjunction with serum values    Fluid Type-FTP CYTOPERI   Body fluid cell count with differential     Status: Abnormal   Collection Time: 01/31/16  9:56 AM  Result Value Ref Range   Fluid Type-FCT PERITONEAL     Comment: CORRECTED ON 12/12 AT 1030: PREVIOUSLY REPORTED AS CYTOPERI   Color, Fluid STRAW (A) YELLOW   Appearance, Fluid CLEAR CLEAR   WBC, Fluid 158 cu mm   Neutrophil Count, Fluid 89 %   Lymphs, Fluid 7 %   Monocyte-Macrophage-Serous Fluid 4 %   Eos, Fluid 0 %   Other Cells, Fluid 0 %  Body fluid culture     Status: None (Preliminary result)   Collection Time: 01/31/16  9:56 AM  Result Value Ref Range   Specimen Description PERITONEAL    Special Requests PENDING    Gram Stain      FEW WBC PRESENT,BOTH PMN AND MONONUCLEAR NO ORGANISMS SEEN    Culture      NO GROWTH < 24 HOURS Performed at [facility]    Report Status PENDING   Glucose, peritoneal fluid     Status: None   Collection Time: 01/31/16  9:56 AM  Result Value Ref Range    Glucose, Peritoneal Fluid 176 mg/dL    Comment: NO NORMAL RANGE ESTABLISHED FOR THIS TEST  PH, Body Fluid     Status: None   Collection Time: 01/31/16  9:56 AM  Result Value Ref Range   pH, Body Fluid 7.8 Not Estab.    Comment: (NOTE) This test was developed and its performance characteristics determined by [facility]. It has not been cleared or approved by the Food and Drug Administration. Performed At: [facility] [place], [place] [id number] Lindon Romp MD IR:[id number]    Source of Sample PERITONEAL   Miscellaneous [facility] test (send-out)     Status: None   Collection Time: 01/31/16  9:56 AM  Result Value Ref Range   [facility] test code 19,588    [facility] test name  TOTAL PROTEIN BODY FLUID    Misc [facility] result COMMENT     Comment: (NOTE) Test Ordered: 761950 Protein, Body Fluid Protein, Body Fluid            0.3              g/dL     BN   ________________________________________________________ :  Peritoneal  :       Pleural          :   Synovial     : :______________:________________________:________________: :              : Transudate :  Exudate  :                : :______________:____________:___________:________________: :  Not Estab.  :   <3  g/dL  :  >3 g/dL  :    <2.5 g/dL   : :______________:____________:___________:________________: The method performance specifications have not been established for this test in body fluid. The test result should be integrated into the clinical context for interpretation. The method performance specifications have not been established for this test in body fluid.  The test result should be integrated into the clinical context for interpretation. Performed At: [facility] [place], [place] [id number] Lindon Romp MD IR:[id number]   Glucose, capillary     Status: Abnormal   Collection Time: 01/31/16 11:17 AM  Result Value Ref Range   Glucose-Capillary 135 (H) 65 - 99 mg/dL   Glucose, capillary     Status: Abnormal   Collection Time: 01/31/16  2:08 PM  Result Value Ref Range   Glucose-Capillary 121 (H) 65 - 99 mg/dL  Glucose, capillary     Status: Abnormal   Collection Time: 01/31/16  4:00 PM  Result Value Ref Range   Glucose-Capillary 134 (H) 65 - 99 mg/dL  Glucose, capillary     Status: Abnormal   Collection Time: 01/31/16  5:54 PM  Result Value Ref Range   Glucose-Capillary 157 (H) 65 - 99 mg/dL  Glucose, capillary     Status: Abnormal   Collection Time: 01/31/16  7:55 PM  Result Value Ref Range   Glucose-Capillary 148 (H) 65 - 99 mg/dL  Glucose, capillary     Status: Abnormal   Collection Time: 01/31/16 11:50 PM  Result Value Ref Range   Glucose-Capillary 191 (H) 65 - 99 mg/dL  Glucose, capillary     Status: Abnormal   Collection Time: 02/01/16  4:02 AM  Result Value Ref Range   Glucose-Capillary 207 (H) 65 - 99 mg/dL  Basic metabolic panel     Status: Abnormal   Collection Time: 02/01/16  4:09 AM  Result Value Ref Range   Sodium 134 (L) 135 - 145 mmol/L   Potassium 3.7 3.5 - 5.1 mmol/L   Chloride 109 101 - 111 mmol/L   CO2 17 (L) 22 - 32 mmol/L   Glucose, Bld 231 (H) 65 - 99 mg/dL   BUN 46 (H) 6 - 20 mg/dL   Creatinine, Ser 3.15 (H) 0.44 - 1.00 mg/dL   Calcium 7.4 (L) 8.9 - 10.3 mg/dL   GFR calc non Af Amer 14 (L) >60 mL/min   GFR calc Af Amer 16 (L) >60 mL/min    Comment: (NOTE) The eGFR has been calculated using the CKD EPI equation. This calculation has not been validated in all clinical situations. eGFR's persistently <60 mL/min signify possible Chronic Kidney Disease.    Anion gap 8 5 - 15  Magnesium     Status: None   Collection Time: 02/01/16  4:09 AM  Result Value Ref Range   Magnesium 1.9 1.7 - 2.4 mg/dL  Phosphorus     Status: None   Collection Time: 02/01/16  4:09 AM  Result Value Ref Range   Phosphorus 4.3 2.5 - 4.6 mg/dL  Procalcitonin     Status: None   Collection Time: 02/01/16  4:09 AM  Result Value Ref Range    Procalcitonin 1.46 ng/mL    Comment:        Interpretation: PCT > 0.5 ng/mL and <= 2 ng/mL: Systemic infection (sepsis) is possible, but other conditions are known to elevate PCT as well. (NOTE)         ICU PCT Algorithm  Non ICU PCT Algorithm    ----------------------------     ------------------------------         PCT < 0.25 ng/mL                 PCT < 0.1 ng/mL     Stopping of antibiotics            Stopping of antibiotics       strongly encouraged.               strongly encouraged.    ----------------------------     ------------------------------       PCT level decrease by               PCT < 0.25 ng/mL       >= 80% from peak PCT       OR PCT 0.25 - 0.5 ng/mL          Stopping of antibiotics                                             encouraged.     Stopping of antibiotics           encouraged.    ----------------------------     ------------------------------       PCT level decrease by              PCT >= 0.25 ng/mL       < 80% from peak PCT        AND PCT >= 0.5 ng/mL             Continuing antibiotics                                              encouraged.       Continuing antibiotics            encouraged.    ----------------------------     ------------------------------     PCT level increase compared          PCT > 0.5 ng/mL         with peak PCT AND          PCT >= 0.5 ng/mL             Escalation of antibiotics                                          strongly encouraged.      Escalation of antibiotics        strongly encouraged.   CBC with Differential/Platelet     Status: Abnormal   Collection Time: 02/01/16  4:15 AM  Result Value Ref Range   WBC 12.5 (H) 3.6 - 11.0 K/uL   RBC 3.30 (L) 3.80 - 5.20 MIL/uL   Hemoglobin 11.3 (L) 12.0 - 16.0 g/dL   HCT 32.9 (L) 35.0 - 47.0 %   MCV 99.9 80.0 - 100.0 fL   MCH 34.3 (H) 26.0 - 34.0 pg   MCHC 34.3 32.0 - 36.0 g/dL   RDW 16.7 (H) 11.5 - 14.5 %   Platelets 89 (L) 150 - 440 K/uL   Neutrophils  Relative % 87 %   Neutro Abs 10.8 (H) 1.4 - 6.5 K/uL   Lymphocytes Relative 8 %   Lymphs Abs 1.0 1.0 - 3.6 K/uL   Monocytes Relative 5 %   Monocytes Absolute 0.6 0.2 - 0.9 K/uL   Eosinophils Relative 0 %   Eosinophils Absolute 0.0 0 - 0.7 K/uL   Basophils Relative 0 %   Basophils Absolute 0.0 0 - 0.1 K/uL  Glucose, capillary     Status: Abnormal   Collection Time: 02/01/16  8:01 AM  Result Value Ref Range   Glucose-Capillary 184 (H) 65 - 99 mg/dL  Ammonia     Status: Abnormal   Collection Time: 02/01/16  8:25 AM  Result Value Ref Range   Ammonia 54 (H) 9 - 35 umol/L  Glucose, capillary     Status: Abnormal   Collection Time: 02/01/16 11:56 AM  Result Value Ref Range   Glucose-Capillary 198 (H) 65 - 99 mg/dL   No components found for: ESR, C REACTIVE PROTEIN MICRO: Recent Results (from the past 720 hour(s))  Urine culture     Status: None   Collection Time: 01/25/2016 10:28 AM  Result Value Ref Range Status   Specimen Description URINE, RANDOM  Final   Special Requests NONE  Final   Culture NO GROWTH Performed at [facility]   Final   Report Status 01/31/2016 FINAL  Final  Blood Culture (routine x 2)     Status: None (Preliminary result)   Collection Time: 02/18/2016 10:35 AM  Result Value Ref Range Status   Specimen Description BLOOD RIGHT EJ  Final   Special Requests   Final    BOTTLES DRAWN AEROBIC AND ANAEROBIC AER 6 ML ANA 7 ML   Culture NO GROWTH 2 DAYS  Final   Report Status PENDING  Incomplete  Blood Culture (routine x 2)     Status: None (Preliminary result)   Collection Time: 01/31/2016 10:35 AM  Result Value Ref Range Status   Specimen Description BLOOD LEFT EJ  Final   Special Requests   Final    BOTTLES DRAWN AEROBIC AND ANAEROBIC  AER 6 ML ANA 4 ML   Culture NO GROWTH 2 DAYS  Final   Report Status PENDING  Incomplete  MRSA PCR Screening     Status: None   Collection Time: 02/12/2016  4:10 PM  Result Value Ref Range Status   MRSA by PCR NEGATIVE  NEGATIVE Final    Comment:        The GeneXpert MRSA Assay (FDA approved for NASAL specimens only), is one component of a comprehensive MRSA colonization surveillance program. It is not intended to diagnose MRSA infection nor to guide or monitor treatment for MRSA infections.   Body fluid culture     Status: None (Preliminary result)   Collection Time: 01/31/16  9:56 AM  Result Value Ref Range Status   Specimen Description PERITONEAL  Final   Special Requests PENDING  Incomplete   Gram Stain   Final    FEW WBC PRESENT,BOTH PMN AND MONONUCLEAR NO ORGANISMS SEEN    Culture   Final    NO GROWTH < 24 HOURS Performed at [facility]    Report Status PENDING  Incomplete    IMAGING: Dg Abd 1 View  Result Date: 01/31/2016 CLINICAL DATA:  Abdominal distention. EXAM: ABDOMEN - 1 VIEW COMPARISON:  No prior. FINDINGS: NG tube noted with its tip projected over the stomach. Soft tissue structures are unremarkable. No  bowel distention. No free air. No acute bony abnormality. Bibasilar atelectasis. IMPRESSION: 1. NG tube noted with its tip projected over stomach. No bowel distention. No acute intra-abdominal abnormality. 2.  Bibasilar atelectasis. Electronically Signed   By: Marcello Moores  Register   On: 01/31/2016 06:59   Ct Head Wo Contrast  Result Date: 02/15/2016 CLINICAL DATA:  70 year old female with cirrhosis, decreased mental status/unresponsive. Initial encounter. EXAM: CT HEAD WITHOUT CONTRAST TECHNIQUE: Contiguous axial images were obtained from the base of the skull through the vertex without intravenous contrast. COMPARISON:  Head CT without contrast 06/10/2015 and earlier. FINDINGS: Brain: No midline shift, mass effect, or evidence of intracranial mass lesion. No ventriculomegaly. Stable cerebral volume. Mild for age scattered bilateral cerebral white matter hypodensity appears stable. Otherwise Gray-white matter differentiation is within normal limits throughout the brain. No  cortical encephalomalacia identified. No acute intracranial hemorrhage identified. No cortically based acute infarct identified. Vascular: Calcified atherosclerosis at the skull base. Skull: No acute osseous abnormality identified. Sinuses/Orbits: Visualized paranasal sinuses and mastoids are stable and well pneumatized. Other: No acute orbit or scalp soft tissue findings. IMPRESSION: Stable and largely unremarkable for age noncontrast CT appearance of the brain. Electronically Signed   By: Genevie Ann M.D.   On: 02/12/2016 15:06   US Paracentesis  Result Date: 01/31/2016 INDICATION: Cirrhotic patient with possible sepsis and hypotension. Request for diagnostic paracentesis. EXAM: ULTRASOUND GUIDED DIAGNOSTIC PARACENTESIS MEDICATIONS: None. COMPLICATIONS: None immediate. PROCEDURE: Patient is unable to give consent. Telephone consent was obtained from the patient's brother. Initial ultrasound scanning demonstrates a large amount of ascites within the left lower abdominal quadrant. The right lower abdomen was prepped and draped in the usual sterile fashion. 1% lidocaine was used for local anesthesia. Following this, a 6 Fr Safe-T-Centesis catheter was introduced. An ultrasound image was saved for documentation purposes. The paracentesis was performed. The catheter was removed and a dressing was applied. The patient tolerated the procedure well without immediate post procedural complication. FINDINGS: A total of approximately 700 mL of yellow fluid was removed. Samples were sent to the laboratory as requested by the clinical team. Additional fluid was not removed due to hypotension. IMPRESSION: Successful ultrasound-guided paracentesis yielding 700 mL of peritoneal fluid. Electronically Signed   By: Markus Daft M.D.   On: 01/31/2016 11:10   Dg Chest Port 1 View  Result Date: 01/31/2016 CLINICAL DATA:  Right central line pulled back. Cirrhosis, varices, hepatitis. EXAM: PORTABLE CHEST 1 VIEW COMPARISON:   01/01/2016 CXR FINDINGS: Right IJ central line catheter is now noted with its tip at the cavoatrial junction. No pneumothorax. Right-sided PICC line tip is seen in the distal SVC just proximal to the tip of the right IJ catheter. Left mid lung atelectasis. Confluent airspace opacity in the right lower lobe consistent with pneumonia. Heart is normal in size. The aorta is ectatic and atherosclerotic. Gastric tube extends into the stomach. The patient is status post cholecystectomy. Chronic degenerative change noted about both shoulders. IMPRESSION: Right lower lobe pneumonia with left mid lung atelectasis. Satisfactory support line and tube positions. Electronically Signed   By: Ashley Royalty M.D.   On: 01/31/2016 01:32   Dg Chest Port 1 View  Result Date: 02/01/2016 CLINICAL DATA:  Acute respiratory failure EXAM: PORTABLE CHEST 1 VIEW COMPARISON:  02/18/2016 CXR at 1602 hours FINDINGS: The heart is top-normal in size. The thoracic aorta is ectatic and there is aortic atherosclerosis noted. Diffuse interstitial prominence is noted with mild vascular congestion. Superimposed pulmonary consolidations are not entirely  excluded in the right lower lobe and about the left hilum. Gastric tube extends into the stomach. Right IJ catheter extends into the right atrium approximately 5 cm beyond the cavoatrial junction. Right-sided PICC line tip is seen in the distal SVC. No acute osseous abnormality. Cholecystectomy clips are seen in the right upper quadrant. IMPRESSION: 1. Possible evolving pneumonia in the left perihilar and right lower lobe distribution. 2. Right IJ catheter is seen in the right atrium. 3. Right-sided PICC line tip in the distal SVC. 4. Gastric tube extends into the stomach. 5. Aortic atherosclerosis. Electronically Signed   By: Ashley Royalty M.D.   On: 01/20/2016 23:50   Dg Chest Port 1 View  Result Date: 02/09/2016 CLINICAL DATA:  Central line placement EXAM: PORTABLE CHEST 1 VIEW COMPARISON:   02/19/2016 FINDINGS: Central line as the for that with the tip at the cavoatrial junction. Right PICC line tip in the lower SVC. No pneumothorax. Heart is normal size. Slight improvement in aeration with decreasing right base atelectasis. No focal opacity on the left. No effusions. IMPRESSION: Right central line tip at the cavoatrial junction. Right PICC line tip in the lower SVC. Decreasing right base atelectasis. Electronically Signed   By: Rolm Baptise M.D.   On: 01/20/2016 16:24   Dg Chest Port 1 View  Result Date: 02/11/2016 CLINICAL DATA:  Confusion and hypotension EXAM: PORTABLE CHEST 1 VIEW COMPARISON:  December 08, 2015 Right jugular catheter tip is in the right atrium. Right subclavian catheter tip is in the superior vena cava. No pneumothorax. There is no edema or consolidation. Heart size and pulmonary vascularity are normal. No adenopathy. There is atherosclerotic calcification in the aorta. Bones are osteoporotic. IMPRESSION: Central catheters as described without pneumothorax. No edema or consolidation. Aortic atherosclerosis. Bones osteoporotic. Electronically Signed   By: Lowella Grip III M.D.   On: 01/20/2016 12:17   Dg Abd Portable 1v  Result Date: 02/06/2016 CLINICAL DATA:  OG tube placement EXAM: PORTABLE ABDOMEN - 1 VIEW COMPARISON:  None. FINDINGS: Enteric to is in place with the tip in the right upper abdomen, possibly in the distal stomach or proximal duodenum. Nonobstructive bowel gas pattern. IMPRESSION: Enteric tube tip in the peripyloric region, possibly proximal duodenum or distal stomach. Electronically Signed   By: Rolm Baptise M.D.   On: 01/21/2016 16:23    Assessment:   Gloria Cole is a 70 y.o. female with cirrhosis, recent admission for SBP who finished 5 days of levo in October and was at [facility] resources apparently doing well when she developed several days of confusion and was sent to ED from GI office. SHe was obtunded, sugar 10, hypotensive. No  fevers and wbc nml. CXR possible RLL infiltrate. Paracentesis is not consistent with SBP at this point but was at last visit. I suspect she has aspiration PNA. MRSA PCR negative.  Recommendations Dc vanco. Continue meropenem for now. If clinically improving can transition to levofloxacin and flagyl for a 7 day course Thank you very much for allowing me to participate in the care of this patient. Please call with questions.   Cheral Marker. Ola Spurr, MD

## 2016-02-01 NOTE — Progress Notes (Signed)
Nutrition Follow-up  DOCUMENTATION CODES:   Not applicable  INTERVENTION:  -TF: recommend increasing Vital 1.5 to rate of 30 ml/hr with titration as tolerated to goal of 55 ml/hr (increase by 10 mL q 8 hours). Continue to assess  NUTRITION DIAGNOSIS:   Inadequate oral intake related to inability to eat as evidenced by NPO status.  Being addressed via TF  GOAL:   Patient will meet greater than or equal to 90% of their needs  MONITOR:   TF tolerance, Diet advancement, Labs, Weight trends  REASON FOR ASSESSMENT:   Malnutrition Screening Tool    ASSESSMENT:   70 yo female admitted with lethargy, hypotension with sepsis. Pt with hx of cirrhosis, esophageal varices, DM, hepatitis C  Off BiPap, currently on venti -mask Remains on levophed, vasopressin and sodium bicarb drips Pt remains minimally responsive, CT head negative for acute findings, ammonia improving Continues with poor UOP, BUN/Creatinine remain stable Tolerating Vital 1.5 at rate of 20 ml/hr Labs: ammonia 54, sodium 134, BUN 46, Creatinine 3.15, FSBS 100s-200s Meds: lactulose  Diet Order:  Diet NPO time specified  Skin:   (stage II sacrum)  Last BM:  12/13  Height:   Ht Readings from Last 1 Encounters:  01/31/2016 5\' 5"  (1.651 m)    Weight:   Wt Readings from Last 1 Encounters:  02/01/16 152 lb 5.4 oz (69.1 kg)    Filed Weights   02/08/2016 1600 01/31/16 0451 02/01/16 0500  Weight: 149 lb 14.6 oz (68 kg) 160 lb 4.4 oz (72.7 kg) 152 lb 5.4 oz (69.1 kg)    BMI:  Body mass index is 25.35 kg/m.  Estimated Nutritional Needs:   Kcal:  1800-2100 kcals  Protein:  90-105 g  Fluid:  >/= 1.8 L  EDUCATION NEEDS:   No education needs identified at this time  [place], [place], [place] [phone] Pager  [phone] Weekend/On-Call Pager

## 2016-02-01 NOTE — Progress Notes (Signed)
Pharmacy Antibiotic Note  Gloria Cole is a 70 y.o. female admitted on 02/09/2016 with sepsis.  Pharmacy has been consulted for meropenem and vancomycin dosing.  Plan: 1.Continue  Meropenem 500mg  IV Q12hr.   2. Continue Vancomycin 1000mg  IV Q48hr fo goal trough of 15-20. Will obtain trough as clinically indicated. ID following patient- will review results of peritoneal fluid and make recommendation.   Height: 5\' 5"  (165.1 cm) Weight: 152 lb 5.4 oz (69.1 kg) IBW/kg (Calculated) : 57  Temp (24hrs), Avg:97.7 F (36.5 C), Min:96.6 F (35.9 C), Max:99.1 F (37.3 C)   Recent Labs Lab 02/12/2016 1034 02/17/2016 1415 02/07/2016 2030 01/31/16 0413 02/01/16 0409 02/01/16 0415  WBC 5.9  --   --  3.9  --  12.5*  CREATININE 3.23*  --  3.15* 3.23* 3.15*  --   LATICACIDVEN 2.1* 2.4* 3.6*  --   --   --     Estimated Creatinine Clearance: 15.7 mL/min (by C-G formula based on SCr of 3.15 mg/dL (H)).    Allergies  Allergen Reactions  . Penicillins Swelling and Other (See Comments)    Reaction:  Facial swelling  Has patient had a PCN reaction causing immediate rash, facial/tongue/throat swelling, SOB or lightheadedness with hypotension: Yes Has patient had a PCN reaction causing severe rash involving mucus membranes or skin necrosis: No Has patient had a PCN reaction that required hospitalization No Has patient had a PCN reaction occurring within the last 10 years: No If all of the above answers are "NO", then may proceed with Cephalosporin use.    Antimicrobials this admission: Meropenem 12/11 >>  Vancomycin 12/11 >>   Dose adjustments this admission: N/A  Microbiology results: 12/11 BCx: no growth < 24 hours 12/11 UCx: no growth  12/11 MRSA PCR: negative  Pharmacy will continue to monitor and adjust per consult.    Pernell Dupre, PharmD, [place]  02/01/2016 2:08 PM

## 2016-02-01 NOTE — Consult Note (Signed)
Consultation Note Date: 02/01/2016   Patient Name: Gloria Cole  DOB: [birth year]  MRN: [id number]  Age / Sex: 70 y.o., female  PCP: Kirk Ruths, MD Referring Physician: Vilinda Boehringer, MD  Reason for Consultation: Establishing goals of care  HPI/Patient Profile: 70 y.o. female  with past medical history of cirrhosis, hepatitis C, hypertension, hyperlipidemia, DM type 2, GERD, and esophageal varices admitted on 02/17/2016 with lethargy from [facility]. In ED, blood glucose was less than 10. She was given 3 amps of D50 and started on D10 continuous infusion. Also severely hypotensive and given 2L NS. Did not respond to fluid resuscitation-started on levophed infusion and transferred to ICU. Mild respiratory distress-on NRB mask. Sepsis of unknown origin-on antibiotics. Hypothermic on bear hugger. Remains on levophed and vasopressin for hypotension. Acute renal failure with chronic kidney disease stage III. Per nephrology, conservative management, dialysis would be difficult given liver cirrhosis. Also with ascites and hyperammonemia-receiving lactulose. Paracentesis on 12/12. Overall, prognosis is guarded and patient at high risk for cardiac arrest and death. Palliative medicine consultation for goals of care.   Clinical Assessment and Goals of Care: This NP met with brother, Gloria Cole at bedside. Introduced palliative care. Ms. Lux was born and raised in [place] but lived in [place] for about 51 years with her husband. Her husband passed away 6 years ago, and Gloria Cole helped her move back home. 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 Gloria Cole tells me she was diagnosed with cirrhosis about two years ago. She had a good quality of life until her health declined two months ago. She was getting weaker and not interested in eating. He often encouraged her to take the lactulose so  she wouldn't get confused with high ammonia. Ms. Heyne has one child in [place], but chose Gloria Cole to be her POA.  Discussed her current medical status and co-morbidities that are contributing. Gloria Cole is realistic that she may not survive this hospitalization. He understands that many aggressive interventions are being done to keep her alive. He is hopeful she will show improvement in the next day or two but understands this may be the end of her life. He is thankful that they recently completed a living will and she expressed her wishes on not being resuscitated or on life support. She has always been an independent woman and knows she would not want to be "sick and in the bed" for the rest of her life.   Briefly discussed what a comfort measure pathway would look like if she does not show improvement despite aggressive medical interventions. Gloria Cole has talked with the family and they understand the severity of her condition. Answered questions and offered support. Hard choices copy given.   HCPOA-brother Gloria Cole)   SUMMARY OF RECOMMENDATIONS    DNR/DNI  Brother/POA hopeful for improvement in the next day or two, but realistic in the severity of her condition and poor prognosis. If she does not improve, he wants her to be comfortable.   PMT will f/u  tomorrow, 12/14.  Code Status/Advance Care Planning:  DNR   Symptom Management:   Per attending  Palliative Prophylaxis:   Aspiration, Delirium Protocol, Oral Care and Turn Reposition  Additional Recommendations (Limitations, Scope, Preferences):  Full Scope Treatment-except DNR/DNI  Psycho-social/Spiritual:   Desire for further Chaplaincy support:yes  Additional Recommendations: Caregiving  Support/Resources  Prognosis:   Unable to determine   Discharge Planning: To Be Determined most likely hospital death     Primary Diagnoses: Present on Admission: . Sepsis ([place])   I have reviewed the medical record,  interviewed the patient and family, and examined the patient. The following aspects are pertinent.  Past Medical History:  Diagnosis Date  . Cirrhosis ([place])   . Diabetes ([place]) 2006  . Esophageal varices ([place]) March 2014  . GERD (gastroesophageal reflux disease)   . Hepatitis   . History of encephalopathy   . History of hepatitis C   . Hyperlipidemia associated with type 2 diabetes mellitus ([place])   . Hypertension   . Portal hypertensive gastropathy    Social History   Social History  . Marital status: Widowed    Spouse name: N/A  . Number of children: N/A  . Years of education: N/A   Social History Main Topics  . Smoking status: Current Some Day Smoker    Packs/day: 0.25    Years: 30.00    Types: Cigarettes  . Smokeless tobacco: Never Used  . Alcohol use No     Comment: occasionally  . Drug use: No  . Sexual activity: Not Asked   Other Topics Concern  . None   Social History Narrative  . None   Family History  Problem Relation Age of Onset  . Diabetes Sister   . Diabetes Brother   . CAD Mother   . CVA Father   . Hypertension Father   . Diabetes Father    Scheduled Meds: . chlorhexidine  15 mL Mouth Rinse BID  . famotidine (PEPCID) IV  20 mg Intravenous Q24H  . heparin subcutaneous  5,000 Units Subcutaneous Q8H  . insulin aspart  0-9 Units Subcutaneous Q4H  . ipratropium-albuterol  3 mL Nebulization BID  . lactulose  30 g Oral QID  . mouth rinse  15 mL Mouth Rinse q12n4p  . meropenem (MERREM) IV  500 mg Intravenous Q12H  . sodium chloride flush  10-40 mL Intracatheter Q12H  . vancomycin  1,000 mg Intravenous Q48H   Continuous Infusions: . feeding supplement (VITAL 1.5 CAL) 1,000 mL (02/01/16 1153)  . norepinephrine (LEVOPHED) Adult infusion 15 mcg/min (02/01/16 1153)  .  sodium bicarbonate 150 mEq in sterile water 1000 mL infusion 75 mL/hr at 02/01/16 1153  . vasopressin (PITRESSIN) infusion - *FOR SHOCK* 0.03 Units/min (02/01/16 1153)   PRN  Meds:.sodium chloride, sodium chloride flush Medications Prior to Admission:  Prior to Admission medications   Medication Sig Start Date End Date Taking? Authorizing Provider  cyanocobalamin 500 MCG tablet Take 500 mcg by mouth every other day.    Yes Historical Provider, MD  Diclofenac Sodium 3 % GEL Place 2 g onto the skin 3 (three) times daily.   Yes Historical Provider, MD  dicyclomine (BENTYL) 10 MG capsule Take 10 mg by mouth every 6 (six) hours as needed for spasms.   Yes Historical Provider, MD  fluticasone (FLONASE) 50 MCG/ACT nasal spray Place 2 sprays into both nostrils daily.   Yes Historical Provider, MD  folic acid (FOLVITE) 1 MG tablet Take 0.5 tablets (0.5 mg total) by  mouth daily. 12/13/15  Yes Nicholes Mango, MD  gabapentin (NEURONTIN) 100 MG capsule Take 100 mg by mouth 2 (two) times daily.   Yes Historical Provider, MD  lactulose (CHRONULAC) 10 GM/15ML solution Take 45 mLs (30 g total) by mouth 3 (three) times daily. Patient taking differently: Take 30 g by mouth 4 (four) times daily.  11/19/14  Yes Srikar Sudini, MD  loratadine (CLARITIN) 10 MG tablet Take 10 mg by mouth daily as needed for allergies or rhinitis.   Yes Historical Provider, MD  Multiple Vitamin (MULTIVITAMIN WITH MINERALS) TABS tablet Take 1 tablet by mouth daily.   Yes Historical Provider, MD  nadolol (CORGARD) 20 MG tablet Take 20 mg by mouth daily.    Yes Historical Provider, MD  nicotine (NICODERM CQ - DOSED IN MG/24 HOURS) 14 mg/24hr patch Place 1 patch (14 mg total) onto the skin daily. 12/13/15  Yes Nicholes Mango, MD  ondansetron (ZOFRAN-ODT) 4 MG disintegrating tablet Take 4 mg by mouth every 8 (eight) hours as needed for nausea or vomiting.   Yes Historical Provider, MD  pantoprazole (PROTONIX) 40 MG tablet Take 40 mg by mouth daily.    Yes Historical Provider, MD  pravastatin (PRAVACHOL) 40 MG tablet Take 40 mg by mouth at bedtime.    Yes Historical Provider, MD  rifaximin (XIFAXAN) 550 MG TABS tablet Take  550 mg by mouth 2 (two) times daily.   Yes Historical Provider, MD  spironolactone (ALDACTONE) 50 MG tablet Take 50 mg by mouth daily.    Yes Historical Provider, MD  traMADol (ULTRAM) 50 MG tablet Take 1 tablet (50 mg total) by mouth every 8 (eight) hours as needed for moderate pain (for pain). Patient taking differently: Take 50 mg by mouth every 8 (eight) hours as needed for moderate pain.  12/12/15  Yes Nicholes Mango, MD  witch hazel-glycerin (TUCKS) pad Apply topically as needed for itching. Patient taking differently: Apply 1 application topically as needed for itching. Apply to rectum 12/12/15  Yes Nicholes Mango, MD  levofloxacin (LEVAQUIN) 500 MG tablet Take 1 tablet (500 mg total) by mouth daily. Patient not taking: Reported on 01/28/2016 12/13/15   Nicholes Mango, MD  nystatin (MYCOSTATIN) 100000 UNIT/ML suspension Use as directed 5 mLs (500,000 Units total) in the mouth or throat 4 (four) times daily. Patient not taking: Reported on 02/10/2016 12/12/15   Nicholes Mango, MD   Allergies  Allergen Reactions  . Penicillins Swelling and Other (See Comments)    Reaction:  Facial swelling  Has patient had a PCN reaction causing immediate rash, facial/tongue/throat swelling, SOB or lightheadedness with hypotension: Yes Has patient had a PCN reaction causing severe rash involving mucus membranes or skin necrosis: No Has patient had a PCN reaction that required hospitalization No Has patient had a PCN reaction occurring within the last 10 years: No If all of the above answers are "NO", then may proceed with Cephalosporin use.   Review of Systems  Unable to perform ROS: Acuity of condition    Physical Exam  Constitutional: She appears lethargic. She appears ill.  Cardiovascular: Regular rhythm.  Exam reveals distant heart sounds.   Pulmonary/Chest: No accessory muscle usage. No respiratory distress. She has decreased breath sounds. She has rhonchi.  Abdominal: She exhibits distension. There is no  tenderness.  Musculoskeletal: She exhibits edema (generalized).  Neurological: She appears lethargic.  Skin: Skin is warm and dry.  Psychiatric: Cognition and memory are impaired. She is inattentive.  Nursing note and vitals reviewed.  Vital Signs: BP (!) 91/54 (BP Location: Left Arm)   Pulse 80   Temp 97.3 F (36.3 C) (Rectal)   Resp 13   Ht _0  (1.651 m)   Wt 69.1 kg (152 lb 5.4 oz)   SpO2 99%   BMI 25.35 kg/m  Pain Assessment: CPOT     SpO2: SpO2: 99 % O2 Device:SpO2: 99 % O2 Flow Rate: .O2 Flow Rate (L/min): 14 L/min  IO: Intake/output summary:   Intake/Output Summary (Last 24 hours) at 02/01/16 1357 Last data filed at 02/01/16 1234  Gross per 24 hour  Intake          9609.96 ml  Output              127 ml  Net          9482.96 ml    LBM: Last BM Date: 02/01/16 Baseline Weight: Weight: 60.8 kg (134 lb) Most recent weight: Weight: 69.1 kg (152 lb 5.4 oz)     Palliative Assessment/Data: PPS 10%   Flowsheet Rows   Flowsheet Row Most Recent Value  Intake Tab  Referral Department  Critical care  Unit at Time of Referral  ICU  Palliative Care Primary Diagnosis  Sepsis/Infectious Disease  Palliative Care Type  New Palliative care  Reason for referral  Clarify Goals of Care  Date of Admission  02/17/2016  Date first seen by Palliative Care  02/01/16  Clinical Assessment  Palliative Performance Scale Score  10%  Psychosocial & Spiritual Assessment  Palliative Care Outcomes  Patient/Family meeting held?  Yes  Who was at the meeting?  brother Gloria Cole)  [place] goals of care, Provided psychosocial or spiritual support     Time In: 1240 Time Out:1350 Time Total: 34mn Greater than 50%  of this time was spent counseling and coordinating care related to the above assessment and plan.  Signed by:   MIhor Dow FNP-C Palliative Medicine Team  Phone: [phone]: [phone] Please contact Palliative Medicine Team phone at  [phone] questions and concerns.  For individual provider: See AShea Evans

## 2016-02-01 NOTE — Progress Notes (Signed)
[place] for electrolyte management    Pharmacy consulted for electrolyte management for 70 yo female admitted with altered mental status and lethargy. Patient's past medical history is significant for liver cirrhosis requiring chronic lactulose. Patient has had multiple large loose bowel movements today.   Patient received magnesium 4g IV x 1. Will recheck potassium, phosphorus, & magnesium at 1800.   12/13 AM labs for 1800 last evening were not drawn. AM labs are WNL.  Plan:   Will follow up with AM labs on 12/14  Allergies  Allergen Reactions  . Penicillins Swelling and Other (See Comments)    Reaction:  Facial swelling  Has patient had a PCN reaction causing immediate rash, facial/tongue/throat swelling, SOB or lightheadedness with hypotension: Yes Has patient had a PCN reaction causing severe rash involving mucus membranes or skin necrosis: No Has patient had a PCN reaction that required hospitalization No Has patient had a PCN reaction occurring within the last 10 years: No If all of the above answers are "NO", then may proceed with Cephalosporin use.    Patient Measurements: Height: 5\' 5"  (165.1 cm) Weight: 152 lb 5.4 oz (69.1 kg) IBW/kg (Calculated) : 57  Vital Signs: Temp: 99.1 F (37.3 C) (12/13 0100) BP: 99/50 (12/13 0100) Pulse Rate: 85 (12/13 0100) Intake/Output from previous day: 12/12 0701 - 12/13 0700 In: -  Out: 35 [Urine:35] Intake/Output from this shift: No intake/output data recorded.  Labs:  Recent Labs  02/05/2016 1034 02/06/2016 2030 01/31/16 0413 02/01/16 0409 02/01/16 0415  WBC 5.9  --  3.9  --  12.5*  HGB 12.7  --  12.2  --  11.3*  HCT 37.6  --  35.9  --  32.9*  PLT 101*  --  100*  --  89*  APTT 41*  --   --   --   --   CREATININE 3.23* 3.15* 3.23* 3.15*  --   MG  --   --  1.2* 1.9  --   PHOS  --   --  3.9 4.3  --   ALBUMIN 1.7*  --  1.5*  --   --   PROT 5.8*  --  5.4*  --   --   AST 62*  --   54*  --   --   ALT 22  --  20  --   --   ALKPHOS 118  --  115  --   --   BILITOT 2.1*  --  1.9*  --   --    Estimated Creatinine Clearance: 15.7 mL/min (by C-G formula based on SCr of 3.15 mg/dL (H)).  Pharmacy will continue to monitor and adjust per consult.    Paulina Fusi, PharmD, BCPS 02/01/2016 5:59 AM

## 2016-02-01 NOTE — Progress Notes (Addendum)
[place] for electrolyte management    Pharmacy consulted for electrolyte management for 70 yo female admitted with altered mental status and lethargy. Patient's past medical history is significant for liver cirrhosis requiring chronic lactulose. Patient has had multiple large loose bowel movements today.   Plan:   K: 3.7    Mg: 1.9   Phos: 4.3 Electrolytes WNL, no replacement needed at this time.  Will recheck potassium & magnesium with am labs  Allergies  Allergen Reactions  . Penicillins Swelling and Other (See Comments)    Reaction:  Facial swelling  Has patient had a PCN reaction causing immediate rash, facial/tongue/throat swelling, SOB or lightheadedness with hypotension: Yes Has patient had a PCN reaction causing severe rash involving mucus membranes or skin necrosis: No Has patient had a PCN reaction that required hospitalization No Has patient had a PCN reaction occurring within the last 10 years: No If all of the above answers are "NO", then may proceed with Cephalosporin use.    Patient Measurements: Height: 5\' 5"  (165.1 cm) Weight: 152 lb 5.4 oz (69.1 kg) IBW/kg (Calculated) : 57  Vital Signs: Temp: 97.3 F (36.3 C) (12/13 1200) Temp Source: Rectal (12/13 1200) BP: 91/54 (12/13 1200) Pulse Rate: 80 (12/13 1200) Intake/Output from previous day: 12/12 0701 - 12/13 0700 In: -  Out: 35 [Urine:35] Intake/Output from this shift: Total I/O In: 9610 [I.V.:9042.3; NG/GT:467.7; IV Piggyback:100] Out: 127 [Urine:127]  Labs:  Recent Labs  02/11/2016 1034 01/21/2016 2030 01/31/16 0413 02/01/16 0409 02/01/16 0415  WBC 5.9  --  3.9  --  12.5*  HGB 12.7  --  12.2  --  11.3*  HCT 37.6  --  35.9  --  32.9*  PLT 101*  --  100*  --  89*  APTT 41*  --   --   --   --   CREATININE 3.23* 3.15* 3.23* 3.15*  --   MG  --   --  1.2* 1.9  --   PHOS  --   --  3.9 4.3  --   ALBUMIN 1.7*  --  1.5*  --   --   PROT 5.8*  --  5.4*  --   --    AST 62*  --  54*  --   --   ALT 22  --  20  --   --   ALKPHOS 118  --  115  --   --   BILITOT 2.1*  --  1.9*  --   --    Estimated Creatinine Clearance: 15.7 mL/min (by C-G formula based on SCr of 3.15 mg/dL (H)).  Pharmacy will continue to monitor and adjust per consult.    Kaetlin Bullen M Karmel Patricelli 02/01/2016,2:10 PM

## 2016-02-01 NOTE — Progress Notes (Signed)
Subjective:   Patient is critically ill. Urine output remains poor at 127 cc Requiring pressors in the form of norepinephrine and vasopressin Significant oxygen requirement with facemask Intravenous bicarbonate drip, Serum creatinine about the same at 3.15/GFR of 16  Objective:  Vital signs in last 24 hours:  Temp:  [96.6 F (35.9 C)-99.1 F (37.3 C)] 97.9 F (36.6 C) (12/13 1400) Pulse Rate:  [77-92] 78 (12/13 1400) Resp:  [10-16] 11 (12/13 1400) BP: (83-118)/(48-77) 93/61 (12/13 1400) SpO2:  [96 %-100 %] 97 % (12/13 1400) FiO2 (%):  [35 %-100 %] 35 % (12/13 1400) Weight:  [69.1 kg (152 lb 5.4 oz)] 69.1 kg (152 lb 5.4 oz) (12/13 0500)  Weight change: 8.318 kg (18 lb 5.4 oz) Filed Weights   02/19/2016 1600 01/31/16 0451 02/01/16 0500  Weight: 68 kg (149 lb 14.6 oz) 72.7 kg (160 lb 4.4 oz) 69.1 kg (152 lb 5.4 oz)    Intake/Output:    Intake/Output Summary (Last 24 hours) at 02/01/16 1503 Last data filed at 02/01/16 1234  Gross per 24 hour  Intake          9609.96 ml  Output              127 ml  Net          9482.96 ml     Physical Exam: General: Critically ill-appearing, no distress  HEENT Scleral icterus, conjunctival edema, facemask, NG tube  Neck supple  Pulm/lungs Coarse crackles bilaterally  CVS/Heart Tachycardic, normal sinus  Abdomen:  Distended, soft, nontender  Extremities: ++ dependent edema, subcutaneous edema  Neurologic: not following commands, did not respond to tactile stimulation  Skin: warm          Basic Metabolic Panel:   Recent Labs Lab 02/03/2016 1034 01/24/2016 2030 01/31/16 0413 02/01/16 0409  NA 132* 130* 130* 134*  K 5.7* 4.1 3.9 3.7  CL 111 110 111 109  CO2 11* 12* 12* 17*  GLUCOSE 49* 241* 184* 231*  BUN 47* 46* 45* 46*  CREATININE 3.23* 3.15* 3.23* 3.15*  CALCIUM 8.0* 7.3* 7.3* 7.4*  MG  --   --  1.2* 1.9  PHOS  --   --  3.9 4.3     CBC:  Recent Labs Lab 01/31/2016 1034 01/31/16 0413 02/01/16 0415  WBC 5.9 3.9  12.5*  NEUTROABS 3.8  --  10.8*  HGB 12.7 12.2 11.3*  HCT 37.6 35.9 32.9*  MCV 101.6* 101.4* 99.9  PLT 101* 100* 89*      Microbiology:  Recent Results (from the past 720 hour(s))  Urine culture     Status: None   Collection Time: 02/10/2016 10:28 AM  Result Value Ref Range Status   Specimen Description URINE, RANDOM  Final   Special Requests NONE  Final   Culture NO GROWTH Performed at [facility]   Final   Report Status 01/31/2016 FINAL  Final  Blood Culture (routine x 2)     Status: None (Preliminary result)   Collection Time: 02/19/2016 10:35 AM  Result Value Ref Range Status   Specimen Description BLOOD RIGHT EJ  Final   Special Requests   Final    BOTTLES DRAWN AEROBIC AND ANAEROBIC AER 6 ML ANA 7 ML   Culture NO GROWTH 2 DAYS  Final   Report Status PENDING  Incomplete  Blood Culture (routine x 2)     Status: None (Preliminary result)   Collection Time: 02/11/2016 10:35 AM  Result Value Ref Range Status  Specimen Description BLOOD LEFT EJ  Final   Special Requests   Final    BOTTLES DRAWN AEROBIC AND ANAEROBIC  AER 6 ML ANA 4 ML   Culture NO GROWTH 2 DAYS  Final   Report Status PENDING  Incomplete  MRSA PCR Screening     Status: None   Collection Time: 02/15/2016  4:10 PM  Result Value Ref Range Status   MRSA by PCR NEGATIVE NEGATIVE Final    Comment:        The GeneXpert MRSA Assay (FDA approved for NASAL specimens only), is one component of a comprehensive MRSA colonization surveillance program. It is not intended to diagnose MRSA infection nor to guide or monitor treatment for MRSA infections.   Body fluid culture     Status: None (Preliminary result)   Collection Time: 01/31/16  9:56 AM  Result Value Ref Range Status   Specimen Description PERITONEAL  Final   Special Requests PENDING  Incomplete   Gram Stain   Final    FEW WBC PRESENT,BOTH PMN AND MONONUCLEAR NO ORGANISMS SEEN    Culture   Final    NO GROWTH < 24 HOURS Performed at [facility]    Report Status PENDING  Incomplete    Coagulation Studies:  Recent Labs  02/09/2016 1034 01/31/16 0413  LABPROT 23.9* 30.0*  INR 2.10 2.79    Urinalysis: No results for input(s): COLORURINE, LABSPEC, PHURINE, GLUCOSEU, HGBUR, BILIRUBINUR, KETONESUR, PROTEINUR, UROBILINOGEN, NITRITE, LEUKOCYTESUR in the last 72 hours.  Invalid input(s): APPERANCEUR    Imaging: Dg Abd 1 View  Result Date: 01/31/2016 CLINICAL DATA:  Abdominal distention. EXAM: ABDOMEN - 1 VIEW COMPARISON:  No prior. FINDINGS: NG tube noted with its tip projected over the stomach. Soft tissue structures are unremarkable. No bowel distention. No free air. No acute bony abnormality. Bibasilar atelectasis. IMPRESSION: 1. NG tube noted with its tip projected over stomach. No bowel distention. No acute intra-abdominal abnormality. 2.  Bibasilar atelectasis. Electronically Signed   By: Marcello Moores  Register   On: 01/31/2016 06:59   US Paracentesis  Result Date: 01/31/2016 INDICATION: Cirrhotic patient with possible sepsis and hypotension. Request for diagnostic paracentesis. EXAM: ULTRASOUND GUIDED DIAGNOSTIC PARACENTESIS MEDICATIONS: None. COMPLICATIONS: None immediate. PROCEDURE: Patient is unable to give consent. Telephone consent was obtained from the patient's brother. Initial ultrasound scanning demonstrates a large amount of ascites within the left lower abdominal quadrant. The right lower abdomen was prepped and draped in the usual sterile fashion. 1% lidocaine was used for local anesthesia. Following this, a 6 Fr Safe-T-Centesis catheter was introduced. An ultrasound image was saved for documentation purposes. The paracentesis was performed. The catheter was removed and a dressing was applied. The patient tolerated the procedure well without immediate post procedural complication. FINDINGS: A total of approximately 700 mL of yellow fluid was removed. Samples were sent to the laboratory as requested by the clinical  team. Additional fluid was not removed due to hypotension. IMPRESSION: Successful ultrasound-guided paracentesis yielding 700 mL of peritoneal fluid. Electronically Signed   By: Markus Daft M.D.   On: 01/31/2016 11:10   Dg Chest Port 1 View  Result Date: 01/31/2016 CLINICAL DATA:  Right central line pulled back. Cirrhosis, varices, hepatitis. EXAM: PORTABLE CHEST 1 VIEW COMPARISON:  01/01/2016 CXR FINDINGS: Right IJ central line catheter is now noted with its tip at the cavoatrial junction. No pneumothorax. Right-sided PICC line tip is seen in the distal SVC just proximal to the tip of the right IJ catheter. Left  mid lung atelectasis. Confluent airspace opacity in the right lower lobe consistent with pneumonia. Heart is normal in size. The aorta is ectatic and atherosclerotic. Gastric tube extends into the stomach. The patient is status post cholecystectomy. Chronic degenerative change noted about both shoulders. IMPRESSION: Right lower lobe pneumonia with left mid lung atelectasis. Satisfactory support line and tube positions. Electronically Signed   By: Ashley Royalty M.D.   On: 01/31/2016 01:32   Dg Chest Port 1 View  Result Date: 02/05/2016 CLINICAL DATA:  Acute respiratory failure EXAM: PORTABLE CHEST 1 VIEW COMPARISON:  02/08/2016 CXR at 1602 hours FINDINGS: The heart is top-normal in size. The thoracic aorta is ectatic and there is aortic atherosclerosis noted. Diffuse interstitial prominence is noted with mild vascular congestion. Superimposed pulmonary consolidations are not entirely excluded in the right lower lobe and about the left hilum. Gastric tube extends into the stomach. Right IJ catheter extends into the right atrium approximately 5 cm beyond the cavoatrial junction. Right-sided PICC line tip is seen in the distal SVC. No acute osseous abnormality. Cholecystectomy clips are seen in the right upper quadrant. IMPRESSION: 1. Possible evolving pneumonia in the left perihilar and right lower lobe  distribution. 2. Right IJ catheter is seen in the right atrium. 3. Right-sided PICC line tip in the distal SVC. 4. Gastric tube extends into the stomach. 5. Aortic atherosclerosis. Electronically Signed   By: Ashley Royalty M.D.   On: 01/20/2016 23:50   Dg Chest Port 1 View  Result Date: 02/15/2016 CLINICAL DATA:  Central line placement EXAM: PORTABLE CHEST 1 VIEW COMPARISON:  01/31/2016 FINDINGS: Central line as the for that with the tip at the cavoatrial junction. Right PICC line tip in the lower SVC. No pneumothorax. Heart is normal size. Slight improvement in aeration with decreasing right base atelectasis. No focal opacity on the left. No effusions. IMPRESSION: Right central line tip at the cavoatrial junction. Right PICC line tip in the lower SVC. Decreasing right base atelectasis. Electronically Signed   By: Rolm Baptise M.D.   On: 02/16/2016 16:24   Dg Abd Portable 1v  Result Date: 02/06/2016 CLINICAL DATA:  OG tube placement EXAM: PORTABLE ABDOMEN - 1 VIEW COMPARISON:  None. FINDINGS: Enteric to is in place with the tip in the right upper abdomen, possibly in the distal stomach or proximal duodenum. Nonobstructive bowel gas pattern. IMPRESSION: Enteric tube tip in the peripyloric region, possibly proximal duodenum or distal stomach. Electronically Signed   By: Rolm Baptise M.D.   On: 01/21/2016 16:23     Medications:   . feeding supplement (VITAL 1.5 CAL) 1,000 mL (02/01/16 1153)  . norepinephrine (LEVOPHED) Adult infusion 15 mcg/min (02/01/16 1153)  .  sodium bicarbonate 150 mEq in sterile water 1000 mL infusion 75 mL/hr at 02/01/16 1153  . vasopressin (PITRESSIN) infusion - *FOR SHOCK* 0.03 Units/min (02/01/16 1153)   . chlorhexidine  15 mL Mouth Rinse BID  . famotidine (PEPCID) IV  20 mg Intravenous Q24H  . heparin subcutaneous  5,000 Units Subcutaneous Q8H  . insulin aspart  0-9 Units Subcutaneous Q4H  . ipratropium-albuterol  3 mL Nebulization BID  . lactulose  30 g Oral QID  .  mouth rinse  15 mL Mouth Rinse q12n4p  . meropenem (MERREM) IV  500 mg Intravenous Q12H  . sodium chloride flush  10-40 mL Intracatheter Q12H  . vancomycin  1,000 mg Intravenous Q48H   sodium chloride, sodium chloride flush  Assessment/ Plan:  70 y.o.African American female with Hepatitis  C, cirrhosis of the liver, diabetes, gastroesophageal varices, who was admitted to [facility] on 02/08/2016 for evaluation of altered mental status.   Patient is currently critically ill. She also has very poor baseline health. She has known cirrhosis at least since 2014 and is being managed by [facility]. Cirrhosis appears to be nonalcoholic as her brother reports that she has no history of alcohol abuse. She does have hepatitis C.  She has baseline chronic kidney disease with creatinine of 1.40/GFR 42 from October 2017. Over the last 2 months, her health has declined significantly. She has become totally dependent for care and now has to reside in nursing home. She is not even able to get up without assistance according to her brother.  1. Acute renal failure on chronic kidney disease stage III. Baseline creatinine 1.40/GFR 42 from October 2017 2. Liver cirrhosis, decompensated 3. Generalized edema 4. Severe acidosis  Acute renal failure is likely secondary to underlying concurrent illness that has caused decompensation of her liver cirrhosis and altered mental status. She is oliguric. Serum creatinine today remains elevaed at 3.15/GFR 16. Potassium is 3.7. BUN of 46. Due to her poor underlying health condition and decompensated cirrhosis not sure if she will be able to tolerate any form of dialysis.    Decrease rate of iv bicarb administration due to continued volume overload  At this point, recommend palliative care evaluation to clarify goals of care. Would also recommend involving [facility] for participation in decision making as she was being actively followed. Further plan as per hospital course.    LOS:  2 Gloria Cole 12/13/20173:03 PM

## 2016-02-01 NOTE — Progress Notes (Addendum)
PULMONARY / CRITICAL CARE MEDICINE   Name: Gloria Cole MRN: [id number] DOB: 1945-06-14    ADMISSION DATE:  02/18/2016  BRIEF HISTORY: 70 year old female past medical history of cirrhosis, diabetes mellitus allergy varices, GERD, hepatitis C, hyperlipidemia presenting with hypotension, altered mental status, lethargy, requiring Levophed and transferred to ICU. History per chart review and by the bedside. Brother stated that for the past 2-3 today she's been more lethargic than usual, she has not been eating much, no fever, no chills, no sweats. This morning she was noted to be severely altered, EMS was called, and she was brought to the ER where her blood glucose level was less than 10, she was given 3 Amp of D50 and started on a D10 drip. She was also noted to be severely hypotensive was given 2 L of fluids and not responding, therefore a right IJ central line was placed by ER physician and she was started on levo fed. She is currently a resident of [facility]. Review of chart shows recent admission for abdominal pain with ascites, 2.8 L of clear fluid removed and she was discharged on 5 days of an panic antibiotic with Levaquin.   SUBJECTIVE:  Again, lethargic and somnolent this AM, will respond to loud voice and mild sternal rub, this is an improvement from yesterday NH4 level 54   SIGNIFICANT EVENTS: 12/11> Lethargic, hypotensive, hypoglycemic, require 3 A of D50 and a bolus of D10, placed on levo fed, transferred to ICU 12/12 met acidosis, bicarb gtt, levo/vaso, worsening clinical, bedside paracentesis 700c removed by IR (U/S) 12/13 NH4 level=54, still with somnolence, not a great dialysis candidate d/t liver cirrhosis, oliguria  VITAL SIGNS: Temp:  [96.3 F (35.7 C)-99.1 F (37.3 C)] 97.7 F (36.5 C) (12/13 0806) Pulse Rate:  [72-92] 86 (12/13 0806) Resp:  [10-16] 13 (12/13 0806) BP: (65-118)/(46-92) 113/59 (12/13 0806) SpO2:  [96 %-100 %] 98 % (12/13 0806) FiO2  (%):  [100 %] 100 % (12/13 0728) Weight:  [152 lb 5.4 oz (69.1 kg)] 152 lb 5.4 oz (69.1 kg) (12/13 0500) HEMODYNAMICS: CVP:  [1 mmHg-27 mmHg] 15 mmHg VENTILATOR SETTINGS: FiO2 (%):  [100 %] 100 % INTAKE / OUTPUT:  Intake/Output Summary (Last 24 hours) at 02/01/16 6568 Last data filed at 02/01/16 1275  Gross per 24 hour  Intake          9058.46 ml  Output               85 ml  Net          8973.46 ml    Review of Systems  Unable to perform ROS: Critical illness    Physical Exam  Constitutional:  Critically ill appearing  HENT:  Scleral icterus  Eyes: Right eye exhibits no discharge. Left eye exhibits no discharge.  Cardiovascular: Normal rate and normal heart sounds.   No murmur heard. Pulmonary/Chest: She has no wheezes. She has rales.  On NRB Mild resp distress  Abdominal: Soft. She exhibits distension. There is no tenderness.  Musculoskeletal: She exhibits no edema.  Nursing note and vitals reviewed.    LABS:  CBC  Recent Labs Lab 01/20/2016 1034 01/31/16 0413 02/01/16 0415  WBC 5.9 3.9 12.5*  HGB 12.7 12.2 11.3*  HCT 37.6 35.9 32.9*  PLT 101* 100* 89*   Coag's  Recent Labs Lab 02/16/2016 1034 01/31/16 0413  APTT 41*  --   INR 2.10 2.79   BMET  Recent Labs Lab 02/19/2016 2030 01/31/16 0413 02/01/16 0409  NA 130* 130* 134*  K 4.1 3.9 3.7  CL 110 111 109  CO2 12* 12* 17*  BUN 46* 45* 46*  CREATININE 3.15* 3.23* 3.15*  GLUCOSE 241* 184* 231*   Electrolytes  Recent Labs Lab 02/11/2016 2030 01/31/16 0413 02/01/16 0409  CALCIUM 7.3* 7.3* 7.4*  MG  --  1.2* 1.9  PHOS  --  3.9 4.3   Sepsis Markers  Recent Labs Lab 02/07/2016 1034 02/13/2016 1415 02/18/2016 2030 01/31/16 0413 02/01/16 0409  LATICACIDVEN 2.1* 2.4* 3.6*  --   --   PROCALCITON 0.19  --   --  0.23 1.46   ABG  Recent Labs Lab 01/31/16 0621 01/31/16 0830  PHART 7.19* 7.33*  PCO2ART 26* 28*  PO2ART 126* 101   Liver Enzymes  Recent Labs Lab 02/08/2016 1034 01/31/16 0413   AST 62* 54*  ALT 22 20  ALKPHOS 118 115  BILITOT 2.1* 1.9*  ALBUMIN 1.7* 1.5*   Cardiac Enzymes  Recent Labs Lab 01/20/2016 1034  TROPONINI 0.06*   Glucose  Recent Labs Lab 01/31/16 1600 01/31/16 1754 01/31/16 1955 01/31/16 2350 02/01/16 0402 02/01/16 0801  GLUCAP 134* 157* 148* 191* 207* 184*    Imaging US Paracentesis  Result Date: 01/31/2016 INDICATION: Cirrhotic patient with possible sepsis and hypotension. Request for diagnostic paracentesis. EXAM: ULTRASOUND GUIDED DIAGNOSTIC PARACENTESIS MEDICATIONS: None. COMPLICATIONS: None immediate. PROCEDURE: Patient is unable to give consent. Telephone consent was obtained from the patient's brother. Initial ultrasound scanning demonstrates a large amount of ascites within the left lower abdominal quadrant. The right lower abdomen was prepped and draped in the usual sterile fashion. 1% lidocaine was used for local anesthesia. Following this, a 6 Fr Safe-T-Centesis catheter was introduced. An ultrasound image was saved for documentation purposes. The paracentesis was performed. The catheter was removed and a dressing was applied. The patient tolerated the procedure well without immediate post procedural complication. FINDINGS: A total of approximately 700 mL of yellow fluid was removed. Samples were sent to the laboratory as requested by the clinical team. Additional fluid was not removed due to hypotension. IMPRESSION: Successful ultrasound-guided paracentesis yielding 700 mL of peritoneal fluid. Electronically Signed   By: Markus Daft M.D.   On: 01/31/2016 11:10    Cultures: BCx2 12/11 UC 12/11 Sputum Peritoneal fluid 12/12   Antibiotics: Vanc 12/11 Meropenem 12/11  Lines: RIJ 12/11 (ER)  Discussion: 70 year old female past medical history of liver cirrhosis, diabetes, ascites, recent admission for abdominal pain with 2.8 L of fluid removed. Now with sepsis of a known etiology, hypertension, altered mental status,  elevated ammonia level, on levophed  ASSESSMENT / PLAN: PULMONARY Mild respiratory distress  Hypoothermia P:   Cont with NRB, maintain sats >88% DNR/DNI Cont with blanket warmer, cont with abx.   CARDIOVASCULAR CVL RIJ  Hypotension P:  Cont with pressor(s) as needed to maintain MAP>65 Hemodynamic monitoring  RENAL AKI Met acidosis Hyponatremia hypomagnesemia Dehydration P:   Cont with NaHCO3 Monitor BMP Appreciate Nephro consult>conservative management, dialysis would we difficult in this patient given liver cirrhosis and fluid shits.  OGT for FEN and meds  GASTROINTESTINAL Hx of Liver Cirrhosis Ascities Hyperammonemia SBP  P:   PUD Place NGT for FEN and meds Will give lactulose via tube and rectally NH4 102> 196>54 Paracentesis 12/12> fluid with 89%PMNs, cytology findings consistent with SBP, will cont to treat as such.  Cont with abx as above.  Given recurrent SBP, will consult Infectious Disease  HEMATOLOGIC Hx of Thrombocytopenia P:  CBC  INFECTIOUS  Sepsis -unknown etiology, P:   Cont with above abx Bedside spot U/S with moderate amount of fluid, will plan for U/S guided paracentesis Paracentesis 12/12 700cc removed, clear/yellow, fluid cell count benign for SBP  ENDOCRINE DM 2 hypoglycemia P:   - hold metformin, especially with AKI - glucose checks  NEUROLOGIC AMS Metabolic encephalopathy P:   RASS goal: 0 CT head 12/11>no acute findings.  Lactulose for elevated ammonia levels   CODE Status -DNR/DNI   Thank you for consulting [facility], Please feel free to contacts [place] with any questions at [phone] (please enter 7-digits).  I have personally obtained a history, examined the patient, evaluated laboratory and imaging results, formulated the assessment and plan and placed orders.  The Patient requires high complexity decision making for assessment and support, frequent evaluation and titration  of therapies, application of advanced monitoring technologies and extensive interpretation of multiple databases. Critical Care Time devoted to patient care services described in this note is 35 minutes.   Overall, patient is critically ill, prognosis is guarded. Patient at high risk for cardiac arrest and death.    Vilinda Boehringer, MD [facility] Pager [phone] (please enter 7-digits) On Call Pager [phone] (please enter 7-digits)  Note: This note was prepared with Dragon dictation along with smaller phrase technology. Any transcriptional errors that result from this process are unintentional.

## 2016-02-02 DIAGNOSIS — Z515 Encounter for palliative care: Secondary | ICD-10-CM

## 2016-02-02 DIAGNOSIS — K652 Spontaneous bacterial peritonitis: Secondary | ICD-10-CM

## 2016-02-02 DIAGNOSIS — Z7189 Other specified counseling: Secondary | ICD-10-CM

## 2016-02-02 LAB — CBC WITH DIFFERENTIAL/PLATELET
Basophils Absolute: 0.1 10*3/uL (ref 0–0.1)
Basophils Relative: 1 %
EOS PCT: 0 %
Eosinophils Absolute: 0 10*3/uL (ref 0–0.7)
HCT: 32.9 % — ABNORMAL LOW (ref 35.0–47.0)
Hemoglobin: 11.1 g/dL — ABNORMAL LOW (ref 12.0–16.0)
LYMPHS ABS: 0.8 10*3/uL — AB (ref 1.0–3.6)
LYMPHS PCT: 6 %
MCH: 33.6 pg (ref 26.0–34.0)
MCHC: 33.9 g/dL (ref 32.0–36.0)
MCV: 99.1 fL (ref 80.0–100.0)
MONO ABS: 0.5 10*3/uL (ref 0.2–0.9)
MONOS PCT: 4 %
Neutro Abs: 11.7 10*3/uL — ABNORMAL HIGH (ref 1.4–6.5)
Neutrophils Relative %: 89 %
PLATELETS: 70 10*3/uL — AB (ref 150–440)
RBC: 3.32 MIL/uL — ABNORMAL LOW (ref 3.80–5.20)
RDW: 16.7 % — AB (ref 11.5–14.5)
WBC: 13.1 10*3/uL — ABNORMAL HIGH (ref 3.6–11.0)

## 2016-02-02 LAB — GLUCOSE, CAPILLARY
Glucose-Capillary: 193 mg/dL — ABNORMAL HIGH (ref 65–99)
Glucose-Capillary: 200 mg/dL — ABNORMAL HIGH (ref 65–99)
Glucose-Capillary: 202 mg/dL — ABNORMAL HIGH (ref 65–99)
Glucose-Capillary: 227 mg/dL — ABNORMAL HIGH (ref 65–99)
Glucose-Capillary: 228 mg/dL — ABNORMAL HIGH (ref 65–99)
Glucose-Capillary: 291 mg/dL — ABNORMAL HIGH (ref 65–99)

## 2016-02-02 LAB — BASIC METABOLIC PANEL
Anion gap: 5 (ref 5–15)
BUN: 48 mg/dL — AB (ref 6–20)
CALCIUM: 7.5 mg/dL — AB (ref 8.9–10.3)
CO2: 23 mmol/L (ref 22–32)
CREATININE: 2.84 mg/dL — AB (ref 0.44–1.00)
Chloride: 112 mmol/L — ABNORMAL HIGH (ref 101–111)
GFR calc non Af Amer: 16 mL/min — ABNORMAL LOW (ref >60)
GFR, EST AFRICAN AMERICAN: 18 mL/min — AB (ref >60)
GLUCOSE: 226 mg/dL — AB (ref 65–99)
Potassium: 2.8 mmol/L — ABNORMAL LOW (ref 3.5–5.1)
Sodium: 140 mmol/L (ref 135–145)

## 2016-02-02 LAB — MAGNESIUM
Magnesium: 1.7 mg/dL (ref 1.7–2.4)
Magnesium: 2.2 mg/dL (ref 1.7–2.4)

## 2016-02-02 LAB — POTASSIUM: Potassium: 3.2 mmol/L — ABNORMAL LOW (ref 3.5–5.1)

## 2016-02-02 LAB — PHOSPHORUS: Phosphorus: 2.3 mg/dL — ABNORMAL LOW (ref 2.5–4.6)

## 2016-02-02 MED ORDER — MAGNESIUM SULFATE 2 GM/50ML IV SOLN
2.0000 g | Freq: Once | INTRAVENOUS | Status: AC
  Administered 2016-02-02: 2 g via INTRAVENOUS
  Filled 2016-02-02: qty 50

## 2016-02-02 MED ORDER — ATROPINE SULFATE 1 MG/10ML IJ SOSY
PREFILLED_SYRINGE | INTRAMUSCULAR | Status: AC
  Filled 2016-02-02: qty 20

## 2016-02-02 MED ORDER — POTASSIUM CHLORIDE 20 MEQ PO PACK
40.0000 meq | PACK | Freq: Once | ORAL | Status: AC
  Administered 2016-02-02: 40 meq via ORAL
  Filled 2016-02-02: qty 2

## 2016-02-02 MED ORDER — LEVOFLOXACIN IN D5W 500 MG/100ML IV SOLN: 500.0000 mg | INTRAVENOUS | Status: DC

## 2016-02-02 MED ORDER — INSULIN ASPART 100 UNIT/ML ~~LOC~~ SOLN: 0.0000 [IU] | SUBCUTANEOUS | Status: DC

## 2016-02-02 MED ORDER — POTASSIUM PHOSPHATES 15 MMOLE/5ML IV SOLN
30.0000 mmol | Freq: Once | INTRAVENOUS | Status: AC
  Administered 2016-02-02: 30 mmol via INTRAVENOUS
  Filled 2016-02-02: qty 10

## 2016-02-02 MED ORDER — INSULIN ASPART 100 UNIT/ML ~~LOC~~ SOLN: 2.0000 [IU] | SUBCUTANEOUS | Status: DC

## 2016-02-02 MED ORDER — FAMOTIDINE 20 MG PO TABS: 20.0000 mg | ORAL_TABLET | Freq: Every day | ORAL | Status: DC

## 2016-02-02 MED ORDER — METRONIDAZOLE 500 MG PO TABS
500.0000 mg | ORAL_TABLET | Freq: Four times a day (QID) | ORAL | Status: DC
  Administered 2016-02-02: 500 mg via ORAL
  Filled 2016-02-02: qty 1

## 2016-02-02 MED ORDER — LEVOFLOXACIN IN D5W 750 MG/150ML IV SOLN
750.0000 mg | Freq: Once | INTRAVENOUS | Status: AC
  Administered 2016-02-02: 750 mg via INTRAVENOUS
  Filled 2016-02-02: qty 150

## 2016-02-02 MED ORDER — SODIUM CHLORIDE 0.9 % IV SOLN
30.0000 meq | Freq: Once | INTRAVENOUS | Status: AC
  Administered 2016-02-02: 30 meq via INTRAVENOUS
  Filled 2016-02-02: qty 15

## 2016-02-03 NOTE — Progress Notes (Signed)
Pt was found not breathing with nrb mask off nose.  Attempted to ventilate pt via bag valve mask but pt remained apneic. 1 dose of atropine given for low heart rate with no improvement in heart rate. Pt with no pulse and was pronounced dead at 2230.

## 2016-02-04 LAB — CULTURE, BLOOD (ROUTINE X 2)
Culture: NO GROWTH
Culture: NO GROWTH

## 2016-02-04 LAB — BODY FLUID CULTURE: CULTURE: NO GROWTH

## 2016-02-06 ENCOUNTER — Telehealth: Payer: Self-pay | Admitting: Pulmonary Disease

## 2016-02-06 NOTE — Telephone Encounter (Signed)
Death certificate dropped off at front desk and was given to [facility] for doctor signature.

## 2016-02-20 NOTE — Progress Notes (Signed)
Nutrition Follow-up  DOCUMENTATION CODES:   Severe malnutrition in context of chronic illness  INTERVENTION:  -Recommend continuing current TF regimen via NG tube as tolerated by pt; continue to assess  NUTRITION DIAGNOSIS:   Inadequate oral intake related to inability to eat as evidenced by NPO status.  Being addressed via TF  GOAL:   Patient will meet greater than or equal to 90% of their needs  MONITOR:   TF tolerance, Diet advancement, Labs, Weight trends  REASON FOR ASSESSMENT:   Malnutrition Screening Tool    ASSESSMENT:   71 yo female admitted with lethargy, hypotension with sepsis. Pt with hx of cirrhosis, esophageal varices, DM, hepatitis C   Pt remains lethargic, currently on 4L [place], levophed at 8 mcg/min, vasopressin. Sodium bicarb drip Tolerating Vital 1.5 at rate of 55 ml/hr; +stool, on lactulose Creatinine improved, BUN 48, UOP 500 mL in 24 hours, 175 mL thus far today  Nutrition-Focused physical exam completed. Findings are mild fat depletion, mild/moderate to severe muscle depletion, and severe edema.   Labs: potassium 3.2 (supplemented), phosphorus 2.3, magnesium wdl; FSBS 200s Meds: lactulose, levophed   Past Medical History:  Diagnosis Date  . Cirrhosis ([place])   . Diabetes ([place]) 2006  . Esophageal varices ([place]) March 2014  . GERD (gastroesophageal reflux disease)   . Hepatitis   . History of encephalopathy   . History of hepatitis C   . Hyperlipidemia associated with type 2 diabetes mellitus ([place])   . Hypertension   . Portal hypertensive gastropathy     Diet Order:  Diet NPO time specified  Skin:  Wound (see comment) (stage II sacrum)  Last BM:  12/14  Height:   Ht Readings from Last 1 Encounters:  01/29/2016 5\' 5"  (1.651 m)    Weight:   Wt Readings from Last 1 Encounters:  02-14-16 153 lb 14.1 oz (69.8 kg)    Filed Weights   01/31/16 0451 02/01/16 0500 Feb 14, 2016 0400  Weight: 160 lb 4.4 oz (72.7 kg) 152 lb 5.4 oz (69.1 kg) 153  lb 14.1 oz (69.8 kg)    BMI:  Body mass index is 25.61 kg/m.  Estimated Nutritional Needs:   Kcal:  1800-2100 kcals  Protein:  90-105 g  Fluid:  >/= 1.8 L  EDUCATION NEEDS:   No education needs identified at this time  [place], [place], [place] [phone] Pager  [phone] Weekend/On-Call Pager

## 2016-02-20 NOTE — Discharge Summary (Signed)
  Death Summary  Name: Gloria Cole MRN:   [id number] DOB:   [birth year]          ADMISSION DATE:  02/13/2016 CONSULTATION DATE:  01/31/2016 REFERRING MD :  Dr. Joni Fears (ER) Date of Death: Mar 01, 2016 Time of Death: 22:30    Gloria Cole was a 71 year old female past medical history of cirrhosis, diabetes mellitus allergy varices, GERD, hepatitis C, hyperlipidemia presenting with hypotension, altered mental status and lethargy. Patient had worsening ammonia level and was in severe metabolic acidosis and renal failure.  Patient was in shock requiring pressors. Patient went into renal failure secondary to underlying concurrent illness that has caused decpmpensation of her liver cirrhosis and altered mental status. Patient was made a DNR by her brother on 01/31/16.  Patient was obtunded and would respond only to deep sternal rub.  On 02-Mar-2023 patient noted to  Have her NRB mask off  And possible aspiration of tube feeding. Patient was pronounced dead on 2016-03-01 at 10:30 pm.  Cause of Death    Cardio pulmonary Arrest  Liver Cirrhosis, decompensated  Shock related to sepsis   Severe metabolic Acidosis  Electrolyte Imbalance  Metabolic Encephalopathy  Thrombocytopenia    Bincy Varughese,AG-ACNP Pulmonary & Critical Care

## 2016-02-20 NOTE — Progress Notes (Signed)
ID E note Remains critically ill.  Cultures negative  Imp Change meropenem to levo and flagyl for aspiration PNA

## 2016-02-20 NOTE — Progress Notes (Signed)
[place] for electrolyte management    Pharmacy consulted for electrolyte management for 71 yo female admitted with altered mental status and lethargy. Patient's past medical history is significant for liver cirrhosis requiring chronic lactulose. Patient continuing to have multiple bowel movement while on lactulose. Patient received magnesium 2g IV x 1 and potassium 59mEq PO x 1 and potassium 25mEq IV x 1 on 12/14.  Plan:   Will replace potassium phosphate 35mmol IV x 1. Will recheck electrolytes with am labs.     Allergies  Allergen Reactions  . Penicillins Swelling and Other (See Comments)    Reaction:  Facial swelling  Has patient had a PCN reaction causing immediate rash, facial/tongue/throat swelling, SOB or lightheadedness with hypotension: Yes Has patient had a PCN reaction causing severe rash involving mucus membranes or skin necrosis: No Has patient had a PCN reaction that required hospitalization No Has patient had a PCN reaction occurring within the last 10 years: No If all of the above answers are "NO", then may proceed with Cephalosporin use.    Patient Measurements: Height: 5\' 5"  (165.1 cm) Weight: 153 lb 14.1 oz (69.8 kg) IBW/kg (Calculated) : 57  Vital Signs: Temp: 97 F (36.1 C) (12/14 1855) Temp Source: Core (Comment) (12/14 1800) BP: 88/59 (12/14 1800) Pulse Rate: 95 (12/14 1855) Intake/Output from previous day: 12/13 0701 - 12/14 0700 In: 11988.5 [I.V.:10457.3; NG/GT:1131.2; IV Piggyback:400] Out: 497 [Urine:497] Intake/Output from this shift: No intake/output data recorded.  Labs:  Recent Labs  01/31/16 0413 02/01/16 0409 02/01/16 0415 2016/02/13 0500 02-13-16 1320  WBC 3.9  --  12.5* 13.1*  --   HGB 12.2  --  11.3* 11.1*  --   HCT 35.9  --  32.9* 32.9*  --   PLT 100*  --  89* 70*  --   CREATININE 3.23* 3.15*  --  2.84*  --   MG 1.2* 1.9  --  1.7 2.2  PHOS 3.9 4.3  --   --  2.3*  ALBUMIN 1.5*  --    --   --   --   PROT 5.4*  --   --   --   --   AST 54*  --   --   --   --   ALT 20  --   --   --   --   ALKPHOS 115  --   --   --   --   BILITOT 1.9*  --   --   --   --    Estimated Creatinine Clearance: 17.6 mL/min (by C-G formula based on SCr of 2.84 mg/dL (H)).   Pharmacy will continue to monitor and adjust per consult.    Cordae Mccarey L 2016-02-13,8:20 PM

## 2016-02-20 NOTE — Progress Notes (Signed)
Pharmacy Antibiotic Note  Gloria Cole is a 71 y.o. female admitted on 02/16/2016 with sepsis and aspiration PNA. Pharmacy has been consulted for levofloxacin/metronidazole dosing.   Assessment/Plan: On admission, pt treated for SBP (pt has active liver disease and hx of SBP).  12/12: paracentesis 12/13: Based on CXR of RLL PNA and PMN < 250, it is thought pt has aspiration PNA. Vanc d/c based on MRSA PCR (-) and continued on meropenem until 12/14 Pt has documented penicillin allergy with no evidence of tolerating other penicillins. Pt is taking lactulose PO, per ID PO metronidazole is appropriate. Will give IV levofloxacin due to continuous tube feeds and possibility of decreased absorption of PO levofloxacin.  Will transition patient from meropenem 500 mg IV q 12 hours to levofloxacin 750 mg IV x 1 followed by 500 mg IV q 48 hours based on renal function. SCr trending down, dose will need to be increased to 750 mg if CrCl is above 20 mL/min. Will begin metronidazole 500 mg PO every 6 hours. Stop date has been entered for 7 total days of therapy.   Height: 5\' 5"  (165.1 cm) Weight: 153 lb 14.1 oz (69.8 kg) IBW/kg (Calculated) : 57  Temp (24hrs), Avg:98.2 F (36.8 C), Min:97.3 F (36.3 C), Max:99.3 F (37.4 C)   Recent Labs Lab 01/29/2016 1034 02/13/2016 1415 02/16/2016 2030 01/31/16 0413 02/01/16 0409 02/01/16 0415 2016/02/03 0500  WBC 5.9  --   --  3.9  --  12.5* 13.1*  CREATININE 3.23*  --  3.15* 3.23* 3.15*  --  2.84*  LATICACIDVEN 2.1* 2.4* 3.6*  --   --   --   --     Estimated Creatinine Clearance: 17.6 mL/min (by C-G formula based on SCr of 2.84 mg/dL (H)).    Allergies  Allergen Reactions  . Penicillins Swelling and Other (See Comments)    Reaction:  Facial swelling  Has patient had a PCN reaction causing immediate rash, facial/tongue/throat swelling, SOB or lightheadedness with hypotension: Yes Has patient had a PCN reaction causing severe rash involving mucus  membranes or skin necrosis: No Has patient had a PCN reaction that required hospitalization No Has patient had a PCN reaction occurring within the last 10 years: No If all of the above answers are "NO", then may proceed with Cephalosporin use.    Antimicrobials this admission: Levofloxacin 12/11 x 1 Aztreonam 12/11 x 1 Meropenem 12/11 >> 12/14 Vancomycin 12/11 x 2 Levofloxacin 12/14 >> Metronidazole 12/14 >>  Dose adjustments this admission:   Microbiology results: 12/11 BCx: NG x 3 days 12/11 UCx: NG 12/12 Peritoneal Cx: NG <24 hours (after administration of abx) 12/11 MRSA PCR: negative  Thank you for allowing pharmacy to be a part of this patient's care.  Gloria Cole n Gloria Cole 2016-02-03 11:11 AM

## 2016-02-20 NOTE — Progress Notes (Signed)
Inpatient Diabetes Program Recommendations  AACE/ADA: New Consensus Statement on Inpatient Glycemic Control (2015)  Target Ranges:  Prepandial:   less than 140 mg/dL      Peak postprandial:   less than 180 mg/dL (1-2 hours)      Critically ill patients:  140 - 180 mg/dL   Results for Gloria Cole, Gloria Cole (MRN [id number]) as of 02-14-16 08:57  Ref. Range 02/01/2016 08:01 02/01/2016 11:56 02/01/2016 15:51 02/01/2016 19:35 February 14, 2016 00:07 Feb 14, 2016 04:04 Feb 14, 2016 07:25  Glucose-Capillary Latest Ref Range: 65 - 99 mg/dL 184 (H) 198 (H) 251 (H) 181 (H) 193 (H) 200 (H) 227 (H)   Review of Glycemic Control  Diabetes history: DM2 Outpatient Diabetes medications: None Current orders for Inpatient glycemic control: Novolog 0-9 units Q4H  Inpatient Diabetes Program Recommendations: Correction (SSI): Please consider discontinuing current Novolog correction orders and use ICU Glycemic Control order set Phase 1 to improve glycemic control.  Thanks, Barnie Alderman, RN, MSN, CDE Diabetes Coordinator Inpatient Diabetes Program [phone] (Team Pager from 8am to 5pm)

## 2016-02-20 NOTE — Progress Notes (Signed)
[place] for electrolyte management    Pharmacy consulted for electrolyte management for 71 yo female admitted with altered mental status and lethargy. Patient's past medical history is significant for liver cirrhosis requiring chronic lactulose. Patient continuing to have multiple bowel movement while on lactulose.    Plan:   Will replace magnesium 2g IV x 1 and potassium 79mEq PO x 1 and potassium 4mEq IV x 1. Will recheck electrolytes at 1400. Goal magnesium is > 2.      Allergies  Allergen Reactions  . Penicillins Swelling and Other (See Comments)    Reaction:  Facial swelling  Has patient had a PCN reaction causing immediate rash, facial/tongue/throat swelling, SOB or lightheadedness with hypotension: Yes Has patient had a PCN reaction causing severe rash involving mucus membranes or skin necrosis: No Has patient had a PCN reaction that required hospitalization No Has patient had a PCN reaction occurring within the last 10 years: No If all of the above answers are "NO", then may proceed with Cephalosporin use.    Patient Measurements: Height: 5\' 5"  (165.1 cm) Weight: 153 lb 14.1 oz (69.8 kg) IBW/kg (Calculated) : 57  Vital Signs: Temp: 97.7 F (36.5 C) (12/14 1138) Temp Source: Core (Comment) (12/14 1138) BP: 99/57 (12/14 1100) Pulse Rate: 87 (12/14 1100) Intake/Output from previous day: 12/13 0701 - 12/14 0700 In: 11988.5 [I.V.:10457.3; NG/GT:1131.2; IV Piggyback:400] Out: 497 [Urine:497] Intake/Output from this shift: Total I/O In: 701.1 [I.V.:419.4; NG/GT:281.7] Out: -   Labs:  Recent Labs  01/31/16 0413 02/01/16 0409 02/01/16 0415 02-04-16 0500  WBC 3.9  --  12.5* 13.1*  HGB 12.2  --  11.3* 11.1*  HCT 35.9  --  32.9* 32.9*  PLT 100*  --  89* 70*  CREATININE 3.23* 3.15*  --  2.84*  MG 1.2* 1.9  --  1.7  PHOS 3.9 4.3  --   --   ALBUMIN 1.5*  --   --   --   PROT 5.4*  --   --   --   AST 54*  --   --   --    ALT 20  --   --   --   ALKPHOS 115  --   --   --   BILITOT 1.9*  --   --   --    Estimated Creatinine Clearance: 17.6 mL/min (by C-G formula based on SCr of 2.84 mg/dL (H)).  Pharmacy will continue to monitor and adjust per consult.    Trysten Berti L 2016/02/04,11:55 AM

## 2016-02-20 NOTE — Progress Notes (Signed)
Daily Progress Note   Patient Name: Gloria Cole       Date: 02/22/16 DOB: March 18, 1945  Age: 71 y.o. MRN#: [id number] Attending Physician: Vilinda Boehringer, MD Primary Care Physician: Kirk Ruths., MD Admit Date: 02/16/2016  Reason for Consultation/Follow-up: Establishing goals of care  Subjective: Family at bedside. Patient awake with occasional moaning. She does tell me she is at a hospital. Trying to remove safety mittens. Family is hopeful with her slight improvement today. Discussed my concern with her elevated kidney functions. Still on bicarb gtt and vasopressin. Off bear hugger. Brother, Vicente Serene, tells me they are "taking it one day at a time" and will continue to pray for continued improvement through the weekend. Offered my continued support through hospitalization. Vicente Serene has my contact information and knows to call with any questions or concerns.   Length of Stay: 3  Current Medications: Scheduled Meds:  . chlorhexidine  15 mL Mouth Rinse BID  . famotidine  20 mg Oral QHS  . heparin subcutaneous  5,000 Units Subcutaneous Q8H  . ipratropium-albuterol  3 mL Nebulization BID  . lactulose  30 g Oral QID  . levofloxacin (LEVAQUIN) IV  750 mg Intravenous Once   Followed by  . [START ON 02/04/2016] levofloxacin (LEVAQUIN) IV  500 mg Intravenous Q48H  . mouth rinse  15 mL Mouth Rinse q12n4p  . metroNIDAZOLE  500 mg Oral Q6H  . potassium phosphate IVPB (mmol)  30 mmol Intravenous Once  . sodium chloride flush  10-40 mL Intracatheter Q12H    Continuous Infusions: . feeding supplement (VITAL 1.5 CAL) 1,000 mL (02-22-16 1538)  . norepinephrine (LEVOPHED) Adult infusion Stopped (Feb 22, 2016 1319)  .  sodium bicarbonate 150 mEq in sterile water 1000 mL infusion 50 mL/hr  at 2016-02-22 1538  . vasopressin (PITRESSIN) infusion - *FOR SHOCK* 0.03 Units/min (February 22, 2016 1538)    PRN Meds: sodium chloride, sodium chloride flush  Physical Exam  Constitutional: She is easily aroused.  Cardiovascular: Regular rhythm and normal heart sounds.   Pulmonary/Chest: Effort normal. She has decreased breath sounds.  Abdominal: Soft. She exhibits distension.  Neurological: She is alert and easily aroused.  Tells me she is at the hospital  Skin: Skin is warm and dry.  Psychiatric: Her behavior is normal. Her speech is delayed. Cognition and memory are impaired.  Nursing note  and vitals reviewed.          Vital Signs: BP 105/85   Pulse 96   Temp 97.9 F (36.6 C)   Resp 20   Ht 5\' 5"  (1.651 m)   Wt 69.8 kg (153 lb 14.1 oz)   SpO2 90%   BMI 25.61 kg/m  SpO2: SpO2: 90 % O2 Device: O2 Device: Nasal Cannula O2 Flow Rate: O2 Flow Rate (L/min): 4 L/min  Intake/output summary:   Intake/Output Summary (Last 24 hours) at February 15, 2016 1615 Last data filed at 02/15/16 1538  Gross per 24 hour  Intake          4458.42 ml  Output              545 ml  Net          3913.42 ml   LBM: Last BM Date: 02/15/2016 Baseline Weight: Weight: 60.8 kg (134 lb) Most recent weight: Weight: 69.8 kg (153 lb 14.1 oz)  Palliative Assessment/Data: PPS 20%   Flowsheet Rows   Flowsheet Row Most Recent Value  Intake Tab  Referral Department  Critical care  Unit at Time of Referral  ICU  Palliative Care Primary Diagnosis  Sepsis/Infectious Disease  Date Notified  01/31/16  Palliative Care Type  New Palliative care  Reason for referral  Clarify Goals of Care  Date of Admission  02/11/2016  Date first seen by Palliative Care  02/01/16  # of days Palliative referral response time  1 Day(s)  # of days IP prior to Palliative referral  1  Clinical Assessment  Palliative Performance Scale Score  20%  Psychosocial & Spiritual Assessment  Palliative Care Outcomes  Patient/Family meeting held?  Yes    Who was at the meeting?  brother and two relatives  Palliative Care Outcomes  Clarified goals of care, Provided psychosocial or spiritual support      Patient Active Problem List   Diagnosis Date Noted  . Palliative care encounter   . Goals of care, counseling/discussion   . Sepsis ([place]) 02/19/2016  . Pressure injury of skin 02/01/2016  . SBP (spontaneous bacterial peritonitis) ([place]) 12/08/2015  . Hepatic encephalopathy ([place]) 06/10/2015  . Acute hepatic encephalopathy 11/18/2014  . DM (diabetes mellitus) ([place]) 11/18/2014  . HTN (hypertension) 11/18/2014  . Nausea and vomiting 07/03/2012  . Gallstones 06/09/2012  . Hepatic cirrhosis ([place]) 06/09/2012    Palliative Care Assessment & Plan   Patient Profile: 71 y.o. female  with past medical history of cirrhosis, hepatitis C, hypertension, hyperlipidemia, DM type 2, GERD, and esophageal varices admitted on 02/04/2016 with lethargy from [facility]. In ED, blood glucose was less than 10. She was given 3 amps of D50 and started on D10 continuous infusion. Also severely hypotensive and given 2L NS. Did not respond to fluid resuscitation-started on levophed infusion and transferred to ICU. Mild respiratory distress-on NRB mask. Sepsis of unknown origin-on antibiotics. Hypothermic on bear hugger. Remains on levophed and vasopressin for hypotension. Acute renal failure with chronic kidney disease stage III. Per nephrology, conservative management, dialysis would be difficult given liver cirrhosis. Also with ascites and hyperammonemia-receiving lactulose. Paracentesis on 12/12. Overall, prognosis is guarded and patient at high risk for cardiac arrest and death. Palliative medicine consultation for goals of care.   Assessment: Liver cirrhosis Hyperammonemia Metabolic encephalopathy Sepsis of unknown etiology Acute kidney injury Metabolic acidosis Dehydration Hypotension Hypothermia  Recommendations/Plan:  Patient showing some clinical  improvement today. Family hopeful for continued improvement through the weekend.   PMT will  continue to shadow chart and support family through hospitalization.   If patient remains in critical condition and declines through the weekend, will discuss comfort measures and hospice with family.   Code Status: DNR/DNI   Code Status Orders        Start     Ordered   01/21/2016 1419  Do not attempt resuscitation (DNR)  Continuous    Question Answer Comment  In the event of cardiac or respiratory ARREST Do not call a "code blue"   In the event of cardiac or respiratory ARREST Do not perform Intubation, CPR, defibrillation or ACLS   In the event of cardiac or respiratory ARREST Use medication by any route, position, wound care, and other measures to relive pain and suffering. May use oxygen, suction and manual treatment of airway obstruction as needed for comfort.      01/23/2016 1418    Code Status History    Date Active Date Inactive Code Status Order ID Comments User Context   02/15/2016 12:29 PM 02/16/2016  2:18 PM Full Code [id number]  Vilinda Boehringer, MD ED   12/08/2015 11:33 AM 12/12/2015  8:01 PM DNR [id number]  Loletha Grayer, MD ED   06/10/2015  9:53 PM 06/13/2015  4:52 PM Full Code [id number]  Gladstone Lighter, MD Inpatient   11/18/2014  1:20 AM 11/19/2014  5:04 PM Full Code [id number]  Juluis Mire, MD ED    Advance Directive Documentation   Flowsheet Row Most Recent Value  Type of Advance Directive  Living will  Pre-existing out of facility DNR order (yellow form or pink MOST form)  No data  "MOST" Form in Place?  No data       Prognosis:   Unable to determine  Discharge Planning:  To Be Determined  Care plan was discussed with family at bedside  Thank you for allowing the Palliative Medicine Team to assist in the care of this patient.   Time In: 1500 Time Out: 1535 Total Time 67min Prolonged Time Billed  no       Greater than 50%  of this time was spent counseling  and coordinating care related to the above assessment and plan.  Ihor Dow, FNP-C Palliative Medicine Team  Phone: [phone] Fax: [phone]  Please contact Palliative Medicine Team phone at [phone] for questions and concerns.

## 2016-02-20 NOTE — Progress Notes (Signed)
There was a code blue for this patient, but it was called off since she is a DNR. The patient passed away.

## 2016-02-20 NOTE — Progress Notes (Signed)
Subjective:   Patient is critically ill. Urine output remains poor at 500 cc Requiring pressors in the form of norepinephrine Vasopressin is off Slightly more interactive today. Non purposeful movements Serum creatinine slightly improved at 2.84  Objective:  Vital signs in last 24 hours:  Temp:  [97 F (36.1 C)-98.4 F (36.9 C)] 97 F (36.1 C) (12/14 1855) Pulse Rate:  [81-103] 95 (12/14 1855) Resp:  [11-28] 19 (12/14 1855) BP: (88-117)/(54-85) 88/59 (12/14 1800) SpO2:  [84 %-95 %] 89 % (12/14 2031) FiO2 (%):  [100 %] 100 % (12/14 2031) Weight:  [69.8 kg (153 lb 14.1 oz)] 69.8 kg (153 lb 14.1 oz) (12/14 0400)  Weight change: 0.7 kg (1 lb 8.7 oz) Filed Weights   01/31/16 0451 02/01/16 0500 02/29/2016 0400  Weight: 72.7 kg (160 lb 4.4 oz) 69.1 kg (152 lb 5.4 oz) 69.8 kg (153 lb 14.1 oz)    Intake/Output:    Intake/Output Summary (Last 24 hours) at 29-Feb-2016 2118 Last data filed at 2016-02-29 1800  Gross per 24 hour  Intake           3641.9 ml  Output              625 ml  Net           3016.9 ml     Physical Exam: General: Critically ill-appearing, no distress  HEENT Scleral icterus, conjunctival edema, facemask, NG tube  Neck supple  Pulm/lungs Coarse crackles bilaterally  CVS/Heart Tachycardic, normal sinus  Abdomen:  Distended, soft, nontender  Extremities: ++ dependent edema, subcutaneous edema  Neurologic: not following commands,   Skin: warm          Basic Metabolic Panel:   Recent Labs Lab 02/14/2016 1034 01/20/2016 2030 01/31/16 0413 02/01/16 0409 02-29-2016 0500 29-Feb-2016 1320  NA 132* 130* 130* 134* 140  --   K 5.7* 4.1 3.9 3.7 2.8* 3.2*  CL 111 110 111 109 112*  --   CO2 11* 12* 12* 17* 23  --   GLUCOSE 49* 241* 184* 231* 226*  --   BUN 47* 46* 45* 46* 48*  --   CREATININE 3.23* 3.15* 3.23* 3.15* 2.84*  --   CALCIUM 8.0* 7.3* 7.3* 7.4* 7.5*  --   MG  --   --  1.2* 1.9 1.7 2.2  PHOS  --   --  3.9 4.3  --  2.3*     CBC:  Recent Labs Lab  01/23/2016 1034 01/31/16 0413 02/01/16 0415 02-29-2016 0500  WBC 5.9 3.9 12.5* 13.1*  NEUTROABS 3.8  --  10.8* 11.7*  HGB 12.7 12.2 11.3* 11.1*  HCT 37.6 35.9 32.9* 32.9*  MCV 101.6* 101.4* 99.9 99.1  PLT 101* 100* 89* 70*      Microbiology:  Recent Results (from the past 720 hour(s))  Urine culture     Status: None   Collection Time: 02/07/2016 10:28 AM  Result Value Ref Range Status   Specimen Description URINE, RANDOM  Final   Special Requests NONE  Final   Culture NO GROWTH Performed at [facility]   Final   Report Status 01/31/2016 FINAL  Final  Blood Culture (routine x 2)     Status: None (Preliminary result)   Collection Time: 02/08/2016 10:35 AM  Result Value Ref Range Status   Specimen Description BLOOD RIGHT EJ  Final   Special Requests   Final    BOTTLES DRAWN AEROBIC AND ANAEROBIC AER 6 ML ANA 7 ML   Culture  NO GROWTH 3 DAYS  Final   Report Status PENDING  Incomplete  Blood Culture (routine x 2)     Status: None (Preliminary result)   Collection Time: 02/07/2016 10:35 AM  Result Value Ref Range Status   Specimen Description BLOOD LEFT EJ  Final   Special Requests   Final    BOTTLES DRAWN AEROBIC AND ANAEROBIC  AER 6 ML ANA 4 ML   Culture NO GROWTH 3 DAYS  Final   Report Status PENDING  Incomplete  MRSA PCR Screening     Status: None   Collection Time: 02/10/2016  4:10 PM  Result Value Ref Range Status   MRSA by PCR NEGATIVE NEGATIVE Final    Comment:        The GeneXpert MRSA Assay (FDA approved for NASAL specimens only), is one component of a comprehensive MRSA colonization surveillance program. It is not intended to diagnose MRSA infection nor to guide or monitor treatment for MRSA infections.   Body fluid culture     Status: None (Preliminary result)   Collection Time: 01/31/16  9:56 AM  Result Value Ref Range Status   Specimen Description PERITONEAL  Final   Special Requests PENDING  Incomplete   Gram Stain   Final    FEW WBC PRESENT,BOTH  PMN AND MONONUCLEAR NO ORGANISMS SEEN    Culture   Final    NO GROWTH 2 DAYS Performed at [facility]    Report Status PENDING  Incomplete    Coagulation Studies:  Recent Labs  01/31/16 0413  LABPROT 30.0*  INR 2.79    Urinalysis: No results for input(s): COLORURINE, LABSPEC, PHURINE, GLUCOSEU, HGBUR, BILIRUBINUR, KETONESUR, PROTEINUR, UROBILINOGEN, NITRITE, LEUKOCYTESUR in the last 72 hours.  Invalid input(s): APPERANCEUR    Imaging: No results found.   Medications:   . feeding supplement (VITAL 1.5 CAL) 1,000 mL (02-11-16 1800)  . norepinephrine (LEVOPHED) Adult infusion Stopped (11-Feb-2016 1319)  .  sodium bicarbonate 150 mEq in sterile water 1000 mL infusion 50 mL/hr at 02/11/2016 1800  . vasopressin (PITRESSIN) infusion - *FOR SHOCK* 0.03 Units/min (2016-02-11 2046)   . chlorhexidine  15 mL Mouth Rinse BID  . famotidine  20 mg Oral QHS  . heparin subcutaneous  5,000 Units Subcutaneous Q8H  . insulin aspart  2-6 Units Subcutaneous Q4H  . ipratropium-albuterol  3 mL Nebulization BID  . lactulose  30 g Oral QID  . [START ON 02/04/2016] levofloxacin (LEVAQUIN) IV  500 mg Intravenous Q48H  . mouth rinse  15 mL Mouth Rinse q12n4p  . metroNIDAZOLE  500 mg Oral Q6H  . potassium phosphate IVPB (mmol)  30 mmol Intravenous Once  . sodium chloride flush  10-40 mL Intracatheter Q12H   sodium chloride, sodium chloride flush  Assessment/ Plan:  71 y.o.African American female with Hepatitis C, cirrhosis of the liver, diabetes, gastroesophageal varices, who was admitted to [facility] on 02/08/2016 for evaluation of altered mental status.   Patient is currently critically ill. She also has very poor baseline health. She has known cirrhosis at least since 2014 and is being managed by [facility]. Cirrhosis appears to be nonalcoholic as her brother reports that she has no history of alcohol abuse. She does have hepatitis C.  She has baseline chronic kidney disease with creatinine of  1.40/GFR 42 from October 2017. Over the last 2 months, her health has declined significantly. She has become totally dependent for care and now has to reside in nursing home. She is not even  able to get up without assistance according to her brother.  1. Acute renal failure on chronic kidney disease stage III. Baseline creatinine 1.40/GFR 42 from October 2017 2. Liver cirrhosis, decompensated 3. Generalized edema 4. Severe acidosis 5. Hypokalemia  Acute renal failure is likely secondary to underlying concurrent illness that has caused decompensation of her liver cirrhosis and altered mental status.  - S Creatinine has slightly improved - Discussed with patient's brother that she is not a good candidate for dialysis due to multiple underlying comorbidities    LOS: 3 Gloria Cole 2017-12-289:18 PM

## 2016-02-20 NOTE — Progress Notes (Signed)
PULMONARY / CRITICAL CARE MEDICINE   Name: Gloria Cole MRN: [id number] DOB: 02-May-1945    ADMISSION DATE:  01/29/2016  BRIEF HISTORY: 71 year old female past medical history of cirrhosis, diabetes mellitus allergy varices, GERD, hepatitis C, hyperlipidemia presenting with hypotension, altered mental status, lethargy, requiring Levophed and transferred to ICU. History per chart review and by the bedside. Brother stated that for the past 2-3 today she's been more lethargic than usual, she has not been eating much, no fever, no chills, no sweats. This morning she was noted to be severely altered, EMS was called, and she was brought to the ER where her blood glucose level was less than 10, she was given 3 Amp of D50 and started on a D10 drip. She was also noted to be severely hypotensive was given 2 L of fluids and not responding, therefore a right IJ central line was placed by ER physician and she was started on levo fed. She is currently a resident of [facility]. Review of chart shows recent admission for abdominal pain with ascites, 2.8 L of clear fluid removed and she was discharged on 5 days of an panic antibiotic with Levaquin.   SUBJECTIVE:  Again, lethargic and somnolent this AM, will respond to loud voice and mild sternal rub, this is an improvement from yesterday NH4 level 54   SIGNIFICANT EVENTS: 12/11> Lethargic, hypotensive, hypoglycemic, require 3 A of D50 and a bolus of D10, placed on levo fed, transferred to ICU 12/12 met acidosis, bicarb gtt, levo/vaso, worsening clinical, bedside paracentesis 700c removed by IR (U/S) 12/13 NH4 level=54, still with somnolence, not a great dialysis candidate d/t liver cirrhosis, oliguria  VITAL SIGNS: Temp:  [97.3 F (36.3 C)-99.3 F (37.4 C)] 97.9 F (36.6 C) (12/14 0900) Pulse Rate:  [78-95] 94 (12/14 0900) Resp:  [10-28] 28 (12/14 0900) BP: (91-117)/(54-79) 103/64 (12/14 0900) SpO2:  [91 %-99 %] 92 % (12/14 0900) FiO2  (%):  [35 %-40 %] 35 % (12/13 1400) Weight:  [153 lb 14.1 oz (69.8 kg)] 153 lb 14.1 oz (69.8 kg) (12/14 0400) HEMODYNAMICS: CVP:  [3 mmHg-20 mmHg] 15 mmHg VENTILATOR SETTINGS: FiO2 (%):  [35 %-40 %] 35 % INTAKE / OUTPUT:  Intake/Output Summary (Last 24 hours) at 2016-02-25 1019 Last data filed at 02-25-2016 3662  Gross per 24 hour  Intake           3208.3 ml  Output              437 ml  Net           2771.3 ml    Review of Systems  Unable to perform ROS: Mental status change    Physical Exam  Constitutional:  Critically ill appearing  HENT:  Scleral icterus  Eyes: Right eye exhibits no discharge. Left eye exhibits no discharge.  Cardiovascular: Normal rate and normal heart sounds.   No murmur heard. Pulmonary/Chest: She has no wheezes. She has rales.  On [place]  Abdominal: Soft. She exhibits distension. There is no tenderness.  Musculoskeletal: She exhibits no edema.  Skin: Skin is warm and dry.  Nursing note and vitals reviewed.    LABS:  CBC  Recent Labs Lab 01/31/16 0413 02/01/16 0415 02-25-2016 0500  WBC 3.9 12.5* 13.1*  HGB 12.2 11.3* 11.1*  HCT 35.9 32.9* 32.9*  PLT 100* 89* 70*   Coag's  Recent Labs Lab 02/15/2016 1034 01/31/16 0413  APTT 41*  --   INR 2.10 2.79   BMET  Recent Labs  Lab 01/31/16 0413 02/01/16 0409 Mar 01, 2016 0500  NA 130* 134* 140  K 3.9 3.7 2.8*  CL 111 109 112*  CO2 12* 17* 23  BUN 45* 46* 48*  CREATININE 3.23* 3.15* 2.84*  GLUCOSE 184* 231* 226*   Electrolytes  Recent Labs Lab 01/31/16 0413 02/01/16 0409 Mar 01, 2016 0500  CALCIUM 7.3* 7.4* 7.5*  MG 1.2* 1.9 1.7  PHOS 3.9 4.3  --    Sepsis Markers  Recent Labs Lab 02/16/2016 1034 01/25/2016 1415 01/29/2016 2030 01/31/16 0413 02/01/16 0409  LATICACIDVEN 2.1* 2.4* 3.6*  --   --   PROCALCITON 0.19  --   --  0.23 1.46   ABG  Recent Labs Lab 01/31/16 0621 01/31/16 0830  PHART 7.19* 7.33*  PCO2ART 26* 28*  PO2ART 126* 101   Liver Enzymes  Recent Labs Lab  02/07/2016 1034 01/31/16 0413  AST 62* 54*  ALT 22 20  ALKPHOS 118 115  BILITOT 2.1* 1.9*  ALBUMIN 1.7* 1.5*   Cardiac Enzymes  Recent Labs Lab 02/04/2016 1034  TROPONINI 0.06*   Glucose  Recent Labs Lab 02/01/16 1156 02/01/16 1551 02/01/16 1935 2016-03-01 0007 03/01/2016 0404 01-Mar-2016 0725  GLUCAP 198* 251* 181* 193* 200* 227*    Imaging No results found.  Cultures: BCx2 12/11 UC 12/11 Sputum Peritoneal fluid 12/12   Antibiotics: Vanc 12/11 Meropenem 12/11  Lines: RIJ 12/11 (ER)  Discussion: 71 year old female past medical history of liver cirrhosis, diabetes, ascites, recent admission for abdominal pain with 2.8 L of fluid removed. Now with sepsis of a known etiology, hypertension, altered mental status, elevated ammonia level, on levophed  ASSESSMENT / PLAN: PULMONARY Mild respiratory distress  Hypoothermia P:   Cont with NRB, maintain sats >88% DNR/DNI Cont with blanket warmer, cont with abx.    CARDIOVASCULAR CVL RIJ  Hypotension P:  Cont with pressor(s) as needed to maintain MAP>65 Hemodynamic monitoring  RENAL AKI-improving Cr.  Met acidosis-resolving Hyponatremia-resolving Hypomagnesemia-resolving Dehydration P:   Cont with NaHCO3 Monitor BMP Appreciate Nephro consult>conservative management, dialysis would we difficult in this patient given liver cirrhosis and fluid shits.  OGT for FEN and meds Try to keep Mg>2  GASTROINTESTINAL Hx of Liver Cirrhosis Ascities Hyperammonemia-resolving SBP  P:   PUD Place NGT for FEN and meds Will give lactulose via tube and rectally NH4 102> 196>54 Paracentesis 12/12> fluid with 89%PMNs, cytology findings consistent with SBP, will cont to treat as such.  Cont with abx as above.  Given recurrent SBP, will consult Infectious Disease  HEMATOLOGIC Hx of Thrombocytopenia P:  CBC  INFECTIOUS Sepsis -unknown etiology SBP- based on numbers on cytology, current culture negative.  P:    Cont with above abx Bedside spot U/S with moderate amount of fluid, will plan for U/S guided paracentesis Paracentesis 12/12 700cc removed, clear/yellow, fluid cell count consistent with SBP, given clinical improvement, will cont with abx, even though nothing from the cultures yet.   ENDOCRINE DM 2 hypoglycemia P:   - hold metformin, especially with AKI - glucose checks  NEUROLOGIC AMS Metabolic encephalopathy P:   RASS goal: 0 CT head 12/11>no acute findings.  Lactulose for elevated ammonia levels   CODE Status -DNR/DNI  Family Update 12/14: Starting to improve clinically, NH4 decreasing, more awake but not purposeful (keeping eyes closed, and trying to remove things) - while this is a change from the last 2 days, she is still critical. Given the change, family is hopeful, and will readdress goals of care on Monday. Cont with DNR/DNI status, and  full medical treatment.    Thank you for consulting [facility], Please feel free to contacts [place] with any questions at [phone] (please enter 7-digits).  I have personally obtained a history, examined the patient, evaluated laboratory and imaging results, formulated the assessment and plan and placed orders.  The Patient requires high complexity decision making for assessment and support, frequent evaluation and titration of therapies, application of advanced monitoring technologies and extensive interpretation of multiple databases. Critical Care Time devoted to patient care services described in this note is 35 minutes.   Overall, patient is critically ill, prognosis is guarded. Patient at high risk for cardiac arrest and death.    Vilinda Boehringer, MD [facility] Pager [phone] (please enter 7-digits) On Call Pager [phone] (please enter 7-digits)  Note: This note was prepared with Dragon dictation along with smaller phrase technology. Any transcriptional errors  that result from this process are unintentional.

## 2016-02-20 DEATH — deceased

## 2018-04-05 IMAGING — CT CT HEAD W/O CM
3 of 4 series · 16 of 47 positions shown, 19 images · non-contrast
Comparison: Head CT without contrast 06/10/2015 and earlier.

CLINICAL DATA: 70-year-old female with cirrhosis, decreased mental
status/unresponsive. Initial encounter.

EXAM:
CT HEAD WITHOUT CONTRAST
TECHNIQUE: Contiguous axial images were obtained from the base of the skull
through the vertex without intravenous contrast.

[Series 2: head wo · axial · 0.41mm/px · z∈[-89,+31]mm · 10 of 30 slices shown, 13 images]
[im 3/30  brain]
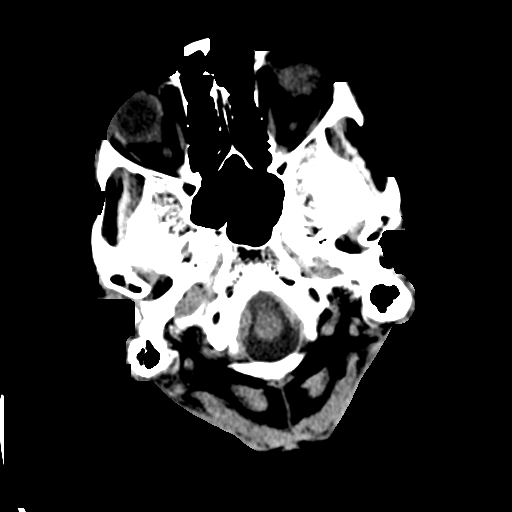
[im 3/30  bone]
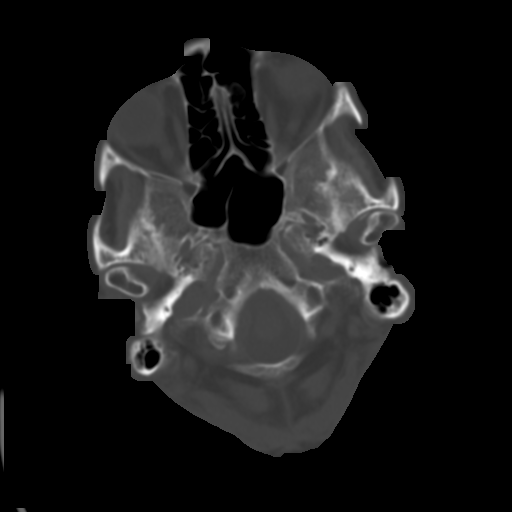
[im 5/30  brain]
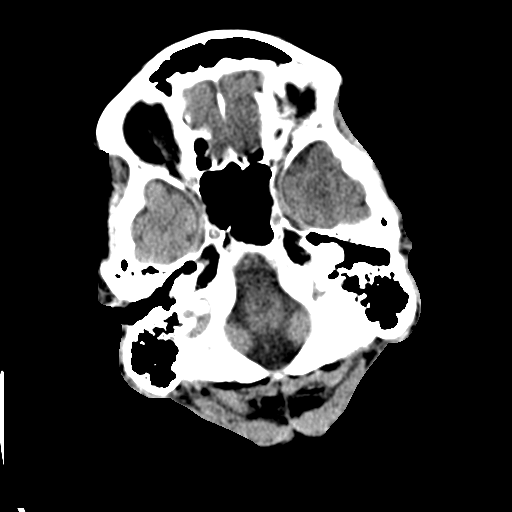
[im 9/30  brain]
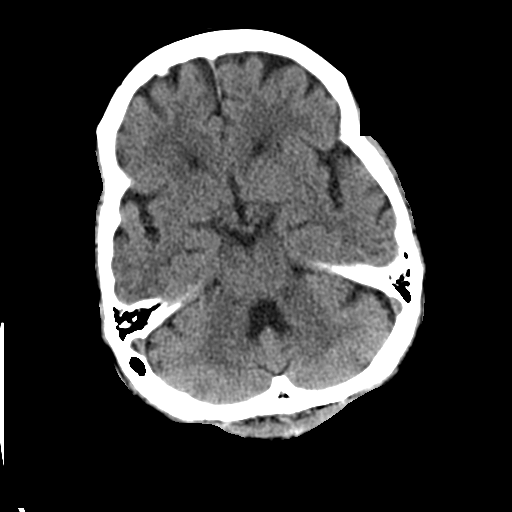
[im 11/30  brain]
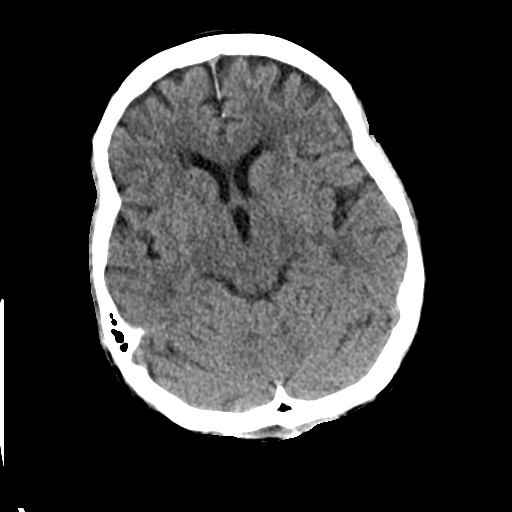
[im 13/30  brain]
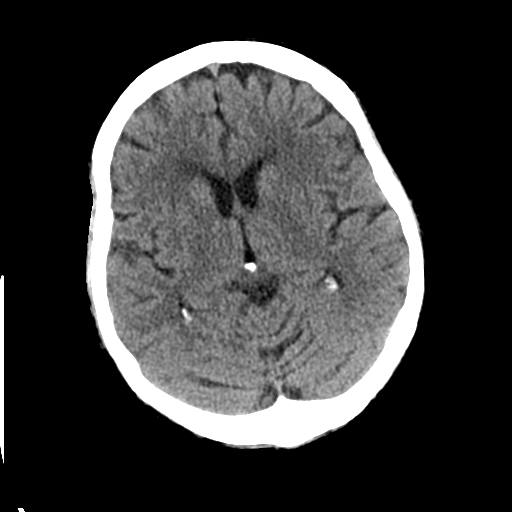
[im 13/30  bone]
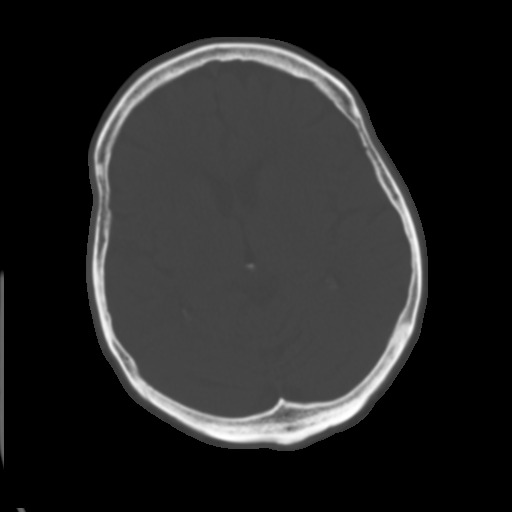
[im 17/30  brain]
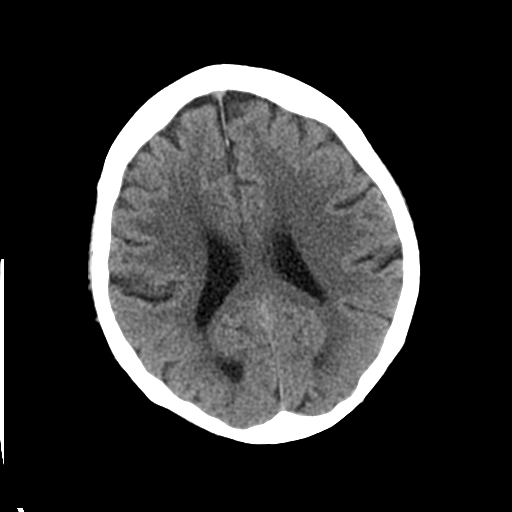
[im 19/30  brain]
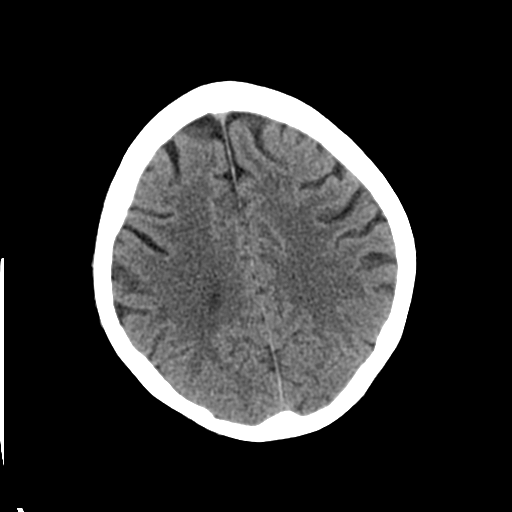
[im 21/30  brain]
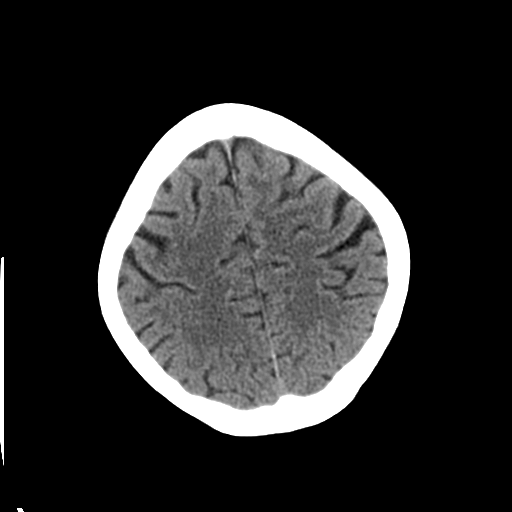
[im 25/30  brain]
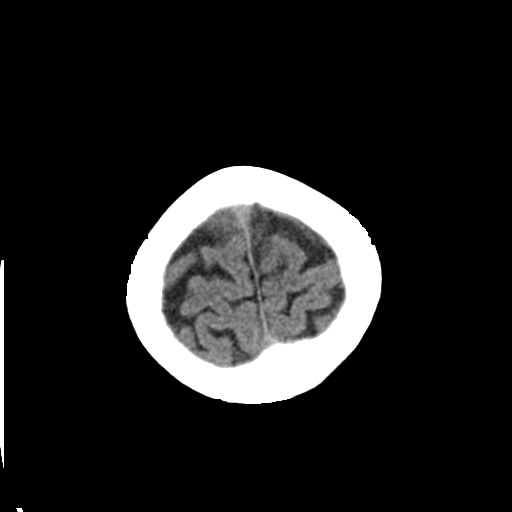
[im 25/30  bone]
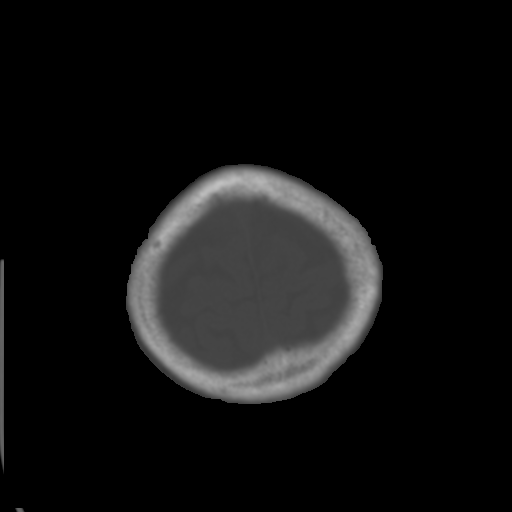
[im 27/30  brain]
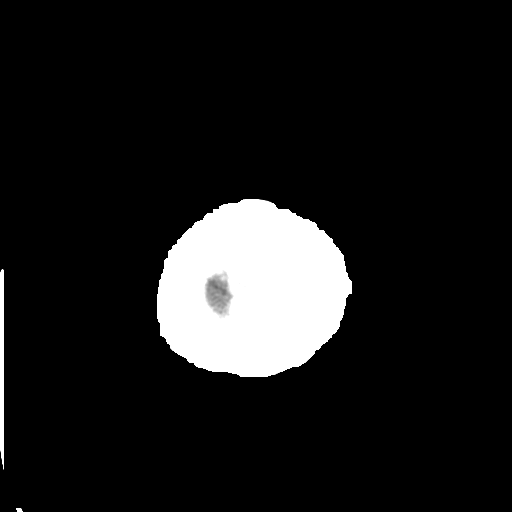

[Series 6: coronal soft tissue · coronal · 0.32mm/px · 3 of 59 slices shown]
[im 20/59  brain]
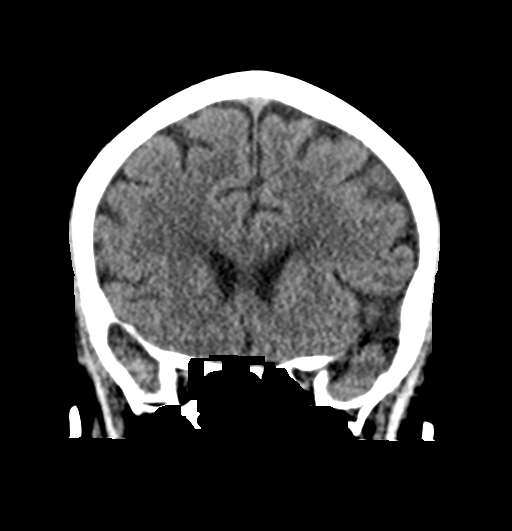
[im 26/59  brain]
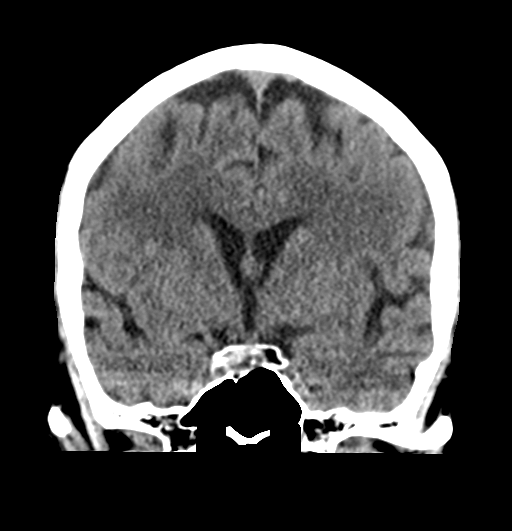
[im 33/59  brain]
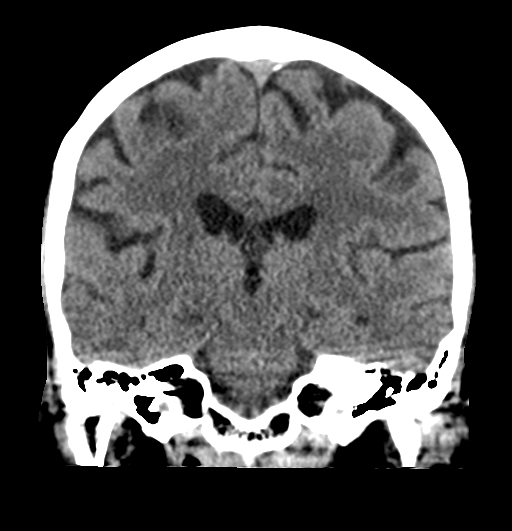

[Series 7: sagittal soft tissue · sagittal · 0.33mm/px · 3 of 49 slices shown]
[im 17/49  brain]
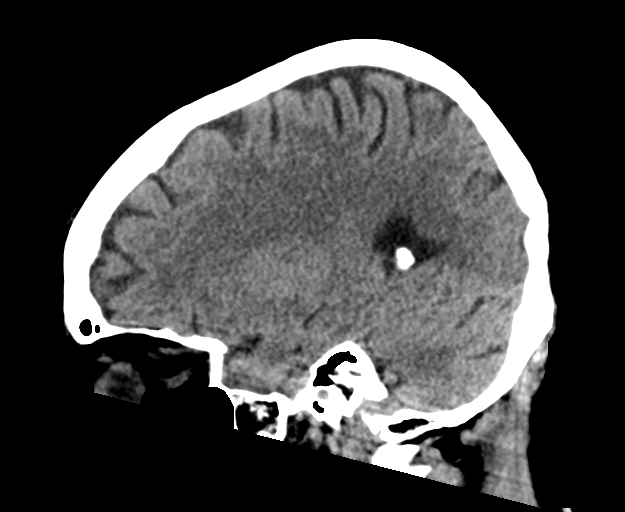
[im 25/49  brain]
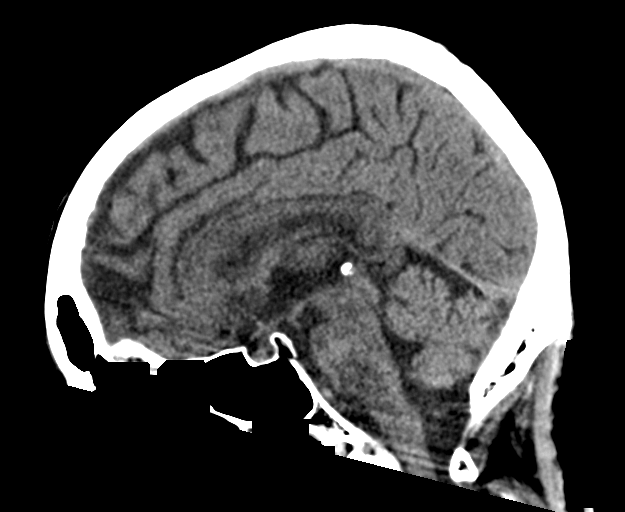
[im 33/49  brain]
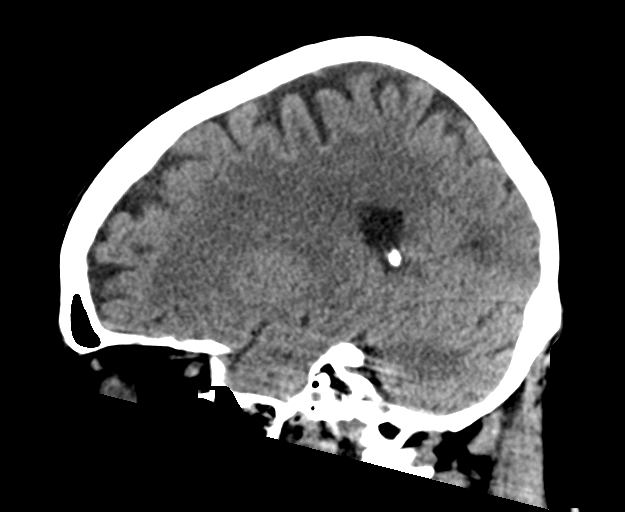

[16 of 47 positions shown; findings below may reference images not displayed]

FINDINGS: Brain: No midline shift, mass effect, or evidence of intracranial
mass lesion. No ventriculomegaly. Stable cerebral volume. Mild for
age scattered bilateral cerebral white matter hypodensity appears
stable. Otherwise Gray-white matter differentiation is within normal
limits throughout the brain. No cortical encephalomalacia
identified. No acute intracranial hemorrhage identified. No
cortically based acute infarct identified.

Vascular: Calcified atherosclerosis at the skull base.

Skull: No acute osseous abnormality identified.

Sinuses/Orbits: Visualized paranasal sinuses and mastoids are stable
and well pneumatized.

Other: No acute orbit or scalp soft tissue findings.
IMPRESSION: Stable and largely unremarkable for age noncontrast CT appearance of
the brain.
